# Patient Record
Sex: Female | Born: 1955 | Race: White | Hispanic: No | Smoking: Former smoker
Health system: Southern US, Community
[De-identification: ages and names within clinical notes are randomized; demographics above are authoritative.]

## PROBLEM LIST (undated history)

## (undated) DIAGNOSIS — E669 Obesity, unspecified: Secondary | ICD-10-CM

## (undated) DIAGNOSIS — R000 Tachycardia, unspecified: Secondary | ICD-10-CM

## (undated) DIAGNOSIS — K5792 Diverticulitis of intestine, part unspecified, without perforation or abscess without bleeding: Secondary | ICD-10-CM

## (undated) DIAGNOSIS — E785 Hyperlipidemia, unspecified: Secondary | ICD-10-CM

## (undated) DIAGNOSIS — N200 Calculus of kidney: Secondary | ICD-10-CM

## (undated) DIAGNOSIS — Z9889 Other specified postprocedural states: Secondary | ICD-10-CM

## (undated) DIAGNOSIS — I639 Cerebral infarction, unspecified: Secondary | ICD-10-CM

## (undated) DIAGNOSIS — K219 Gastro-esophageal reflux disease without esophagitis: Secondary | ICD-10-CM

## (undated) DIAGNOSIS — B370 Candidal stomatitis: Secondary | ICD-10-CM

## (undated) DIAGNOSIS — Z8719 Personal history of other diseases of the digestive system: Secondary | ICD-10-CM

## (undated) DIAGNOSIS — T8859XA Other complications of anesthesia, initial encounter: Secondary | ICD-10-CM

## (undated) DIAGNOSIS — M47812 Spondylosis without myelopathy or radiculopathy, cervical region: Secondary | ICD-10-CM

## (undated) DIAGNOSIS — R7303 Prediabetes: Secondary | ICD-10-CM

## (undated) DIAGNOSIS — T4145XA Adverse effect of unspecified anesthetic, initial encounter: Secondary | ICD-10-CM

## (undated) DIAGNOSIS — E039 Hypothyroidism, unspecified: Secondary | ICD-10-CM

## (undated) DIAGNOSIS — I10 Essential (primary) hypertension: Secondary | ICD-10-CM

## (undated) DIAGNOSIS — Z87891 Personal history of nicotine dependence: Secondary | ICD-10-CM

## (undated) DIAGNOSIS — R011 Cardiac murmur, unspecified: Secondary | ICD-10-CM

## (undated) DIAGNOSIS — Z8489 Family history of other specified conditions: Secondary | ICD-10-CM

## (undated) DIAGNOSIS — R112 Nausea with vomiting, unspecified: Secondary | ICD-10-CM

## (undated) DIAGNOSIS — Z9289 Personal history of other medical treatment: Secondary | ICD-10-CM

## (undated) DIAGNOSIS — E119 Type 2 diabetes mellitus without complications: Secondary | ICD-10-CM

## (undated) DIAGNOSIS — G459 Transient cerebral ischemic attack, unspecified: Secondary | ICD-10-CM

## (undated) HISTORY — DX: Personal history of nicotine dependence: Z87.891

## (undated) HISTORY — DX: Transient cerebral ischemic attack, unspecified: G45.9

## (undated) HISTORY — PX: CYSTOSCOPY W/ STONE MANIPULATION: SHX1427

## (undated) HISTORY — DX: Hyperlipidemia, unspecified: E78.5

## (undated) HISTORY — DX: Spondylosis without myelopathy or radiculopathy, cervical region: M47.812

## (undated) HISTORY — PX: CHOLECYSTECTOMY: SHX55

## (undated) HISTORY — PX: CYSTOSCOPY W/ URETERAL STENT PLACEMENT: SHX1429

## (undated) HISTORY — PX: APPENDECTOMY: SHX54

## (undated) HISTORY — DX: Obesity, unspecified: E66.9

## (undated) HISTORY — DX: Tachycardia, unspecified: R00.0

## (undated) HISTORY — DX: Prediabetes: R73.03

## (undated) HISTORY — DX: Cerebral infarction, unspecified: I63.9

## (undated) HISTORY — PX: ABDOMINAL HYSTERECTOMY: SHX81

## (undated) HISTORY — PX: TONSILLECTOMY: SUR1361

## (undated) HISTORY — DX: Essential (primary) hypertension: I10

## (undated) HISTORY — DX: Gastro-esophageal reflux disease without esophagitis: K21.9

## (undated) HISTORY — DX: Personal history of other diseases of the digestive system: Z87.19

## (undated) HISTORY — PX: BACK SURGERY: SHX140

---

## 1998-09-28 ENCOUNTER — Encounter: Payer: Self-pay | Admitting: Specialist

## 1998-09-28 ENCOUNTER — Ambulatory Visit: Admission: RE | Admit: 1998-09-28 | Discharge: 1998-09-28 | Payer: Self-pay | Admitting: Specialist

## 1998-10-13 ENCOUNTER — Ambulatory Visit (HOSPITAL_COMMUNITY): Admission: RE | Admit: 1998-10-13 | Discharge: 1998-10-13 | Payer: Self-pay | Admitting: Specialist

## 1998-10-13 ENCOUNTER — Encounter: Payer: Self-pay | Admitting: Specialist

## 1998-10-27 ENCOUNTER — Ambulatory Visit (HOSPITAL_COMMUNITY): Admission: RE | Admit: 1998-10-27 | Discharge: 1998-10-27 | Payer: Self-pay | Admitting: Specialist

## 1998-10-28 ENCOUNTER — Ambulatory Visit (HOSPITAL_COMMUNITY): Admission: RE | Admit: 1998-10-28 | Discharge: 1998-10-28 | Payer: Self-pay | Admitting: Specialist

## 1998-10-28 ENCOUNTER — Encounter: Payer: Self-pay | Admitting: Specialist

## 2000-05-30 ENCOUNTER — Encounter: Admission: RE | Admit: 2000-05-30 | Discharge: 2000-05-30 | Payer: Self-pay | Admitting: *Deleted

## 2000-10-13 ENCOUNTER — Encounter: Payer: Self-pay | Admitting: Specialist

## 2000-10-13 ENCOUNTER — Encounter: Admission: RE | Admit: 2000-10-13 | Discharge: 2000-10-13 | Payer: Self-pay | Admitting: Specialist

## 2000-11-09 ENCOUNTER — Ambulatory Visit (HOSPITAL_COMMUNITY): Admission: RE | Admit: 2000-11-09 | Discharge: 2000-11-09 | Payer: Self-pay | Admitting: Specialist

## 2000-11-09 ENCOUNTER — Encounter: Payer: Self-pay | Admitting: Specialist

## 2000-11-27 ENCOUNTER — Encounter: Payer: Self-pay | Admitting: Specialist

## 2000-11-27 ENCOUNTER — Ambulatory Visit (HOSPITAL_COMMUNITY): Admission: RE | Admit: 2000-11-27 | Discharge: 2000-11-27 | Payer: Self-pay | Admitting: Specialist

## 2001-01-09 ENCOUNTER — Encounter: Admission: RE | Admit: 2001-01-09 | Discharge: 2001-04-09 | Payer: Self-pay | Admitting: Anesthesiology

## 2001-01-11 ENCOUNTER — Encounter: Admission: RE | Admit: 2001-01-11 | Discharge: 2001-01-11 | Payer: Self-pay | Admitting: Obstetrics and Gynecology

## 2001-01-11 ENCOUNTER — Encounter: Payer: Self-pay | Admitting: Obstetrics and Gynecology

## 2001-02-06 ENCOUNTER — Other Ambulatory Visit: Admission: RE | Admit: 2001-02-06 | Discharge: 2001-02-06 | Payer: Self-pay | Admitting: Obstetrics and Gynecology

## 2001-02-19 ENCOUNTER — Encounter: Payer: Self-pay | Admitting: Obstetrics and Gynecology

## 2001-02-19 ENCOUNTER — Ambulatory Visit (HOSPITAL_COMMUNITY): Admission: RE | Admit: 2001-02-19 | Discharge: 2001-02-19 | Payer: Self-pay | Admitting: Obstetrics and Gynecology

## 2001-05-02 ENCOUNTER — Encounter: Admission: RE | Admit: 2001-05-02 | Discharge: 2001-07-07 | Payer: Self-pay | Admitting: Anesthesiology

## 2001-07-19 ENCOUNTER — Encounter: Payer: Self-pay | Admitting: Specialist

## 2001-07-19 ENCOUNTER — Encounter: Admission: RE | Admit: 2001-07-19 | Discharge: 2001-07-19 | Payer: Self-pay | Admitting: Specialist

## 2001-08-07 ENCOUNTER — Encounter: Admission: RE | Admit: 2001-08-07 | Discharge: 2001-08-07 | Payer: Self-pay | Admitting: Specialist

## 2001-08-13 ENCOUNTER — Encounter: Payer: Self-pay | Admitting: Specialist

## 2001-08-13 ENCOUNTER — Encounter: Admission: RE | Admit: 2001-08-13 | Discharge: 2001-08-13 | Payer: Self-pay | Admitting: Specialist

## 2002-10-14 ENCOUNTER — Encounter: Payer: Self-pay | Admitting: Specialist

## 2002-10-14 ENCOUNTER — Ambulatory Visit (HOSPITAL_COMMUNITY): Admission: RE | Admit: 2002-10-14 | Discharge: 2002-10-14 | Payer: Self-pay | Admitting: Specialist

## 2003-01-31 ENCOUNTER — Encounter: Payer: Self-pay | Admitting: Obstetrics and Gynecology

## 2003-01-31 ENCOUNTER — Ambulatory Visit: Admission: RE | Admit: 2003-01-31 | Discharge: 2003-01-31 | Payer: Self-pay | Admitting: Obstetrics and Gynecology

## 2003-03-03 ENCOUNTER — Encounter: Payer: Self-pay | Admitting: Obstetrics and Gynecology

## 2003-03-03 ENCOUNTER — Ambulatory Visit (HOSPITAL_COMMUNITY): Admission: RE | Admit: 2003-03-03 | Discharge: 2003-03-03 | Payer: Self-pay | Admitting: Obstetrics and Gynecology

## 2003-03-12 ENCOUNTER — Encounter: Payer: Self-pay | Admitting: Obstetrics and Gynecology

## 2003-03-19 ENCOUNTER — Inpatient Hospital Stay (HOSPITAL_COMMUNITY): Admission: RE | Admit: 2003-03-19 | Discharge: 2003-03-22 | Payer: Self-pay | Admitting: Obstetrics and Gynecology

## 2003-03-19 ENCOUNTER — Encounter (INDEPENDENT_AMBULATORY_CARE_PROVIDER_SITE_OTHER): Payer: Self-pay | Admitting: *Deleted

## 2003-08-27 ENCOUNTER — Encounter: Admission: RE | Admit: 2003-08-27 | Discharge: 2003-08-27 | Payer: Self-pay | Admitting: Specialist

## 2003-08-27 ENCOUNTER — Encounter: Payer: Self-pay | Admitting: Specialist

## 2003-12-18 ENCOUNTER — Encounter: Admission: RE | Admit: 2003-12-18 | Discharge: 2003-12-18 | Payer: Self-pay | Admitting: Specialist

## 2004-04-12 ENCOUNTER — Encounter: Admission: RE | Admit: 2004-04-12 | Discharge: 2004-04-12 | Payer: Self-pay | Admitting: Family Medicine

## 2004-04-12 ENCOUNTER — Ambulatory Visit (HOSPITAL_COMMUNITY): Admission: RE | Admit: 2004-04-12 | Discharge: 2004-04-12 | Payer: Self-pay | Admitting: Family Medicine

## 2004-04-12 ENCOUNTER — Encounter: Admission: RE | Admit: 2004-04-12 | Discharge: 2004-04-12 | Payer: Self-pay | Admitting: Sports Medicine

## 2004-04-20 ENCOUNTER — Encounter: Admission: RE | Admit: 2004-04-20 | Discharge: 2004-04-20 | Payer: Self-pay | Admitting: Family Medicine

## 2004-04-21 ENCOUNTER — Encounter: Admission: RE | Admit: 2004-04-21 | Discharge: 2004-04-21 | Payer: Self-pay | Admitting: Sports Medicine

## 2004-06-28 ENCOUNTER — Encounter: Admission: RE | Admit: 2004-06-28 | Discharge: 2004-06-28 | Payer: Self-pay | Admitting: Family Medicine

## 2004-07-19 ENCOUNTER — Encounter: Admission: RE | Admit: 2004-07-19 | Discharge: 2004-07-19 | Payer: Self-pay | Admitting: Sports Medicine

## 2004-07-19 ENCOUNTER — Ambulatory Visit: Payer: Self-pay | Admitting: Family Medicine

## 2004-08-04 ENCOUNTER — Ambulatory Visit: Payer: Self-pay | Admitting: Family Medicine

## 2004-08-04 ENCOUNTER — Encounter: Admission: RE | Admit: 2004-08-04 | Discharge: 2004-08-04 | Payer: Self-pay | Admitting: Sports Medicine

## 2004-08-11 ENCOUNTER — Ambulatory Visit: Payer: Self-pay | Admitting: Family Medicine

## 2004-10-29 ENCOUNTER — Ambulatory Visit: Payer: Self-pay | Admitting: Sports Medicine

## 2004-12-06 ENCOUNTER — Ambulatory Visit: Payer: Self-pay | Admitting: Family Medicine

## 2005-06-17 ENCOUNTER — Ambulatory Visit: Payer: Self-pay | Admitting: Physical Medicine & Rehabilitation

## 2005-06-17 ENCOUNTER — Inpatient Hospital Stay (HOSPITAL_COMMUNITY): Admission: RE | Admit: 2005-06-17 | Discharge: 2005-06-23 | Payer: Self-pay | Admitting: Specialist

## 2006-06-07 ENCOUNTER — Encounter (INDEPENDENT_AMBULATORY_CARE_PROVIDER_SITE_OTHER): Payer: Self-pay | Admitting: *Deleted

## 2006-11-24 ENCOUNTER — Ambulatory Visit: Payer: Self-pay | Admitting: Family Medicine

## 2006-11-24 LAB — CONVERTED CEMR LAB
AST: 20 units/L (ref 0–37)
Alkaline Phosphatase: 113 units/L (ref 39–117)
BUN: 11 mg/dL (ref 6–23)
Creatinine, Ser: 0.63 mg/dL (ref 0.40–1.20)
HDL: 48 mg/dL (ref >39)
LDL Cholesterol: 110 mg/dL — ABNORMAL HIGH (ref 0–99)
TSH: 2.549 microintl units/mL (ref 0.350–5.50)
Total CHOL/HDL Ratio: 3.9
VLDL: 27 mg/dL (ref 0–40)

## 2006-12-14 ENCOUNTER — Ambulatory Visit: Payer: Self-pay | Admitting: Family Medicine

## 2007-01-04 DIAGNOSIS — E039 Hypothyroidism, unspecified: Secondary | ICD-10-CM

## 2007-01-04 DIAGNOSIS — N10 Acute tubulo-interstitial nephritis: Secondary | ICD-10-CM

## 2007-01-04 DIAGNOSIS — R079 Chest pain, unspecified: Secondary | ICD-10-CM | POA: Insufficient documentation

## 2007-01-04 DIAGNOSIS — M545 Low back pain: Secondary | ICD-10-CM

## 2007-01-04 DIAGNOSIS — Z720 Tobacco use: Secondary | ICD-10-CM

## 2007-01-04 DIAGNOSIS — G5600 Carpal tunnel syndrome, unspecified upper limb: Secondary | ICD-10-CM

## 2007-01-04 DIAGNOSIS — M5382 Other specified dorsopathies, cervical region: Secondary | ICD-10-CM | POA: Insufficient documentation

## 2007-01-04 DIAGNOSIS — K219 Gastro-esophageal reflux disease without esophagitis: Secondary | ICD-10-CM

## 2007-01-05 ENCOUNTER — Encounter (INDEPENDENT_AMBULATORY_CARE_PROVIDER_SITE_OTHER): Payer: Self-pay | Admitting: *Deleted

## 2007-01-17 ENCOUNTER — Encounter: Admission: RE | Admit: 2007-01-17 | Discharge: 2007-01-17 | Payer: Self-pay | Admitting: Specialist

## 2007-01-26 ENCOUNTER — Ambulatory Visit: Payer: Self-pay | Admitting: Family Medicine

## 2007-01-31 ENCOUNTER — Encounter: Payer: Self-pay | Admitting: Family Medicine

## 2007-02-09 ENCOUNTER — Ambulatory Visit: Payer: Self-pay | Admitting: Family Medicine

## 2007-02-09 DIAGNOSIS — E119 Type 2 diabetes mellitus without complications: Secondary | ICD-10-CM | POA: Insufficient documentation

## 2007-03-26 ENCOUNTER — Telehealth (INDEPENDENT_AMBULATORY_CARE_PROVIDER_SITE_OTHER): Payer: Self-pay | Admitting: *Deleted

## 2007-03-30 ENCOUNTER — Ambulatory Visit: Payer: Self-pay | Admitting: Family Medicine

## 2007-03-30 ENCOUNTER — Ambulatory Visit: Payer: Self-pay | Admitting: Internal Medicine

## 2007-03-30 ENCOUNTER — Encounter: Payer: Self-pay | Admitting: Family Medicine

## 2007-03-30 ENCOUNTER — Inpatient Hospital Stay (HOSPITAL_COMMUNITY): Admission: AD | Admit: 2007-03-30 | Discharge: 2007-03-31 | Payer: Self-pay | Admitting: Family Medicine

## 2007-04-09 ENCOUNTER — Ambulatory Visit: Payer: Self-pay | Admitting: Family Medicine

## 2007-05-02 ENCOUNTER — Encounter: Payer: Self-pay | Admitting: Family Medicine

## 2007-05-17 ENCOUNTER — Encounter: Payer: Self-pay | Admitting: Family Medicine

## 2007-06-01 ENCOUNTER — Ambulatory Visit: Payer: Self-pay | Admitting: Family Medicine

## 2007-06-14 ENCOUNTER — Encounter: Payer: Self-pay | Admitting: Family Medicine

## 2007-08-13 ENCOUNTER — Ambulatory Visit: Payer: Self-pay | Admitting: Family Medicine

## 2007-08-13 ENCOUNTER — Emergency Department (HOSPITAL_COMMUNITY): Admission: EM | Admit: 2007-08-13 | Discharge: 2007-08-14 | Payer: Self-pay | Admitting: Emergency Medicine

## 2007-08-14 ENCOUNTER — Telehealth: Payer: Self-pay | Admitting: Family Medicine

## 2007-08-16 ENCOUNTER — Encounter: Admission: RE | Admit: 2007-08-16 | Discharge: 2007-08-16 | Payer: Self-pay | Admitting: Family Medicine

## 2007-08-17 ENCOUNTER — Ambulatory Visit: Payer: Self-pay | Admitting: Family Medicine

## 2007-08-17 LAB — CONVERTED CEMR LAB
Nitrite: NEGATIVE
Protein, U semiquant: NEGATIVE
Urobilinogen, UA: 0.2

## 2007-09-03 ENCOUNTER — Ambulatory Visit (HOSPITAL_COMMUNITY): Admission: RE | Admit: 2007-09-03 | Discharge: 2007-09-03 | Payer: Self-pay | Admitting: Family Medicine

## 2007-09-17 ENCOUNTER — Ambulatory Visit: Payer: Self-pay | Admitting: Family Medicine

## 2007-09-17 DIAGNOSIS — R002 Palpitations: Secondary | ICD-10-CM | POA: Insufficient documentation

## 2007-09-17 DIAGNOSIS — I10 Essential (primary) hypertension: Secondary | ICD-10-CM | POA: Insufficient documentation

## 2007-09-24 ENCOUNTER — Encounter: Payer: Self-pay | Admitting: Family Medicine

## 2007-09-25 ENCOUNTER — Telehealth: Payer: Self-pay | Admitting: *Deleted

## 2007-11-02 ENCOUNTER — Ambulatory Visit: Payer: Self-pay | Admitting: Family Medicine

## 2007-11-28 ENCOUNTER — Encounter: Payer: Self-pay | Admitting: Family Medicine

## 2007-12-24 ENCOUNTER — Telehealth: Payer: Self-pay | Admitting: *Deleted

## 2007-12-24 ENCOUNTER — Ambulatory Visit: Payer: Self-pay | Admitting: Sports Medicine

## 2008-01-04 ENCOUNTER — Encounter: Payer: Self-pay | Admitting: Family Medicine

## 2008-01-04 ENCOUNTER — Emergency Department (HOSPITAL_COMMUNITY): Admission: EM | Admit: 2008-01-04 | Discharge: 2008-01-04 | Payer: Self-pay | Admitting: Family Medicine

## 2008-01-04 ENCOUNTER — Telehealth: Payer: Self-pay | Admitting: *Deleted

## 2008-01-04 LAB — CONVERTED CEMR LAB
Cholesterol, target level: 200 mg/dL
LDL Goal: 70 mg/dL

## 2008-01-07 ENCOUNTER — Encounter: Payer: Self-pay | Admitting: Family Medicine

## 2008-01-07 ENCOUNTER — Ambulatory Visit: Payer: Self-pay | Admitting: Vascular Surgery

## 2008-01-07 ENCOUNTER — Ambulatory Visit: Admission: RE | Admit: 2008-01-07 | Discharge: 2008-01-07 | Payer: Self-pay | Admitting: Family Medicine

## 2008-01-08 ENCOUNTER — Telehealth: Payer: Self-pay | Admitting: *Deleted

## 2008-01-09 ENCOUNTER — Ambulatory Visit: Payer: Self-pay | Admitting: Family Medicine

## 2008-01-10 ENCOUNTER — Telehealth: Payer: Self-pay | Admitting: *Deleted

## 2008-01-11 ENCOUNTER — Ambulatory Visit: Payer: Self-pay | Admitting: Family Medicine

## 2008-01-14 ENCOUNTER — Ambulatory Visit: Payer: Self-pay | Admitting: Family Medicine

## 2008-01-14 ENCOUNTER — Telehealth: Payer: Self-pay | Admitting: *Deleted

## 2008-01-14 LAB — CONVERTED CEMR LAB
Bilirubin Urine: NEGATIVE
Protein, U semiquant: 30
Urobilinogen, UA: 0.2

## 2008-01-15 ENCOUNTER — Encounter: Payer: Self-pay | Admitting: Family Medicine

## 2008-01-17 ENCOUNTER — Encounter: Admission: RE | Admit: 2008-01-17 | Discharge: 2008-01-17 | Payer: Self-pay | Admitting: Specialist

## 2008-01-21 ENCOUNTER — Encounter: Payer: Self-pay | Admitting: Family Medicine

## 2008-01-22 ENCOUNTER — Encounter: Payer: Self-pay | Admitting: Family Medicine

## 2008-01-22 ENCOUNTER — Ambulatory Visit (HOSPITAL_COMMUNITY): Admission: RE | Admit: 2008-01-22 | Discharge: 2008-01-22 | Payer: Self-pay | Admitting: Family Medicine

## 2008-02-29 ENCOUNTER — Ambulatory Visit: Payer: Self-pay | Admitting: Family Medicine

## 2008-03-06 ENCOUNTER — Encounter: Payer: Self-pay | Admitting: Family Medicine

## 2008-03-27 ENCOUNTER — Telehealth (INDEPENDENT_AMBULATORY_CARE_PROVIDER_SITE_OTHER): Payer: Self-pay | Admitting: *Deleted

## 2008-04-07 ENCOUNTER — Ambulatory Visit: Payer: Self-pay | Admitting: Family Medicine

## 2008-05-02 ENCOUNTER — Encounter: Payer: Self-pay | Admitting: Family Medicine

## 2008-05-02 DIAGNOSIS — D126 Benign neoplasm of colon, unspecified: Secondary | ICD-10-CM

## 2008-05-05 ENCOUNTER — Ambulatory Visit: Payer: Self-pay | Admitting: Cardiovascular Disease

## 2008-05-05 LAB — CONVERTED CEMR LAB
BUN: 12 mg/dL (ref 6–23)
Basophils Relative: 0.5 % (ref 0.0–1.0)
CO2: 29 meq/L (ref 19–32)
Calcium: 11.1 mg/dL — ABNORMAL HIGH (ref 8.4–10.5)
Creatinine, Ser: 0.6 mg/dL (ref 0.4–1.2)
Eosinophils Relative: 2.8 % (ref 0.0–5.0)
Glucose, Bld: 117 mg/dL — ABNORMAL HIGH (ref 70–99)
Hemoglobin: 14.5 g/dL (ref 12.0–15.0)
Lymphocytes Relative: 34 % (ref 12.0–46.0)
MCHC: 34.8 g/dL (ref 30.0–36.0)
Monocytes Relative: 2.5 % — ABNORMAL LOW (ref 3.0–12.0)
Neutro Abs: 5.5 10*3/uL (ref 1.4–7.7)
Prothrombin Time: 12.7 s (ref 10.9–13.3)
RBC: 4.56 M/uL (ref 3.87–5.11)

## 2008-05-08 ENCOUNTER — Ambulatory Visit: Payer: Self-pay | Admitting: Cardiovascular Disease

## 2008-05-15 ENCOUNTER — Inpatient Hospital Stay (HOSPITAL_BASED_OUTPATIENT_CLINIC_OR_DEPARTMENT_OTHER): Admission: RE | Admit: 2008-05-15 | Discharge: 2008-05-15 | Payer: Self-pay | Admitting: Cardiovascular Disease

## 2008-05-15 ENCOUNTER — Ambulatory Visit: Payer: Self-pay | Admitting: Cardiovascular Disease

## 2008-06-19 ENCOUNTER — Ambulatory Visit: Payer: Self-pay | Admitting: Cardiovascular Disease

## 2008-07-22 ENCOUNTER — Ambulatory Visit: Payer: Self-pay | Admitting: Family Medicine

## 2008-07-22 ENCOUNTER — Encounter: Payer: Self-pay | Admitting: Family Medicine

## 2008-07-22 ENCOUNTER — Telehealth: Payer: Self-pay | Admitting: *Deleted

## 2008-07-22 LAB — CONVERTED CEMR LAB
Cortisol, Plasma: 6.4 ug/dL
Nitrite: NEGATIVE
Protein, U semiquant: NEGATIVE

## 2008-08-11 ENCOUNTER — Ambulatory Visit: Payer: Self-pay | Admitting: Family Medicine

## 2008-08-11 LAB — CONVERTED CEMR LAB
CO2: 24 meq/L (ref 19–32)
Calcium: 11.5 mg/dL — ABNORMAL HIGH (ref 8.4–10.5)
Chloride: 100 meq/L (ref 96–112)
Creatinine, Ser: 0.6 mg/dL (ref 0.40–1.20)
Glucose, Bld: 85 mg/dL (ref 70–99)
Sodium: 137 meq/L (ref 135–145)

## 2008-08-18 ENCOUNTER — Encounter: Admission: RE | Admit: 2008-08-18 | Discharge: 2008-08-18 | Payer: Self-pay | Admitting: Family Medicine

## 2008-09-09 ENCOUNTER — Ambulatory Visit: Payer: Self-pay | Admitting: Family Medicine

## 2008-09-09 ENCOUNTER — Encounter (INDEPENDENT_AMBULATORY_CARE_PROVIDER_SITE_OTHER): Payer: Self-pay | Admitting: Family Medicine

## 2008-09-09 ENCOUNTER — Encounter: Payer: Self-pay | Admitting: Family Medicine

## 2008-09-09 LAB — CONVERTED CEMR LAB
Ketones, urine, test strip: NEGATIVE
Nitrite: NEGATIVE
Protein, U semiquant: NEGATIVE
Urobilinogen, UA: 0.2

## 2008-09-10 ENCOUNTER — Encounter: Payer: Self-pay | Admitting: Family Medicine

## 2008-09-16 ENCOUNTER — Telehealth: Payer: Self-pay | Admitting: *Deleted

## 2008-09-17 LAB — CONVERTED CEMR LAB: PTH: 49.6 pg/mL (ref 14.0–72.0)

## 2008-09-18 ENCOUNTER — Encounter: Payer: Self-pay | Admitting: Family Medicine

## 2008-10-17 ENCOUNTER — Ambulatory Visit: Payer: Self-pay | Admitting: Family Medicine

## 2008-10-17 ENCOUNTER — Encounter: Admission: RE | Admit: 2008-10-17 | Discharge: 2008-10-17 | Payer: Self-pay | Admitting: Family Medicine

## 2008-10-22 ENCOUNTER — Ambulatory Visit: Payer: Self-pay | Admitting: Family Medicine

## 2008-10-22 ENCOUNTER — Telehealth: Payer: Self-pay | Admitting: *Deleted

## 2008-10-22 DIAGNOSIS — M5430 Sciatica, unspecified side: Secondary | ICD-10-CM

## 2008-10-24 ENCOUNTER — Encounter: Admission: RE | Admit: 2008-10-24 | Discharge: 2008-10-24 | Payer: Self-pay | Admitting: Specialist

## 2008-11-06 ENCOUNTER — Encounter: Admission: RE | Admit: 2008-11-06 | Discharge: 2008-11-06 | Payer: Self-pay | Admitting: Specialist

## 2008-12-10 ENCOUNTER — Ambulatory Visit: Payer: Self-pay | Admitting: Family Medicine

## 2008-12-10 DIAGNOSIS — E7800 Pure hypercholesterolemia, unspecified: Secondary | ICD-10-CM

## 2008-12-10 DIAGNOSIS — M719 Bursopathy, unspecified: Secondary | ICD-10-CM

## 2008-12-10 DIAGNOSIS — M67919 Unspecified disorder of synovium and tendon, unspecified shoulder: Secondary | ICD-10-CM | POA: Insufficient documentation

## 2008-12-11 ENCOUNTER — Ambulatory Visit: Payer: Self-pay | Admitting: Cardiovascular Disease

## 2008-12-11 LAB — CONVERTED CEMR LAB
ALT: 34 units/L (ref 0–35)
AST: 25 units/L (ref 0–37)
Albumin: 4.1 g/dL (ref 3.5–5.2)
TSH: 2.31 microintl units/mL (ref 0.35–5.50)
Total Bilirubin: 0.8 mg/dL (ref 0.3–1.2)
Total Protein: 7.5 g/dL (ref 6.0–8.3)

## 2008-12-12 LAB — CONVERTED CEMR LAB
ALT: 30 units/L (ref 0–35)
AST: 21 units/L (ref 0–37)
Alkaline Phosphatase: 95 units/L (ref 39–117)
CO2: 24 meq/L (ref 19–32)
Creatinine, Ser: 0.6 mg/dL (ref 0.40–1.20)
LDL Cholesterol: 106 mg/dL — ABNORMAL HIGH (ref 0–99)
Sodium: 138 meq/L (ref 135–145)
Total Bilirubin: 0.4 mg/dL (ref 0.3–1.2)
Total CHOL/HDL Ratio: 3.6
Total Protein: 7.8 g/dL (ref 6.0–8.3)
VLDL: 41 mg/dL — ABNORMAL HIGH (ref 0–40)

## 2008-12-15 ENCOUNTER — Ambulatory Visit: Payer: Self-pay | Admitting: Family Medicine

## 2008-12-15 ENCOUNTER — Encounter: Payer: Self-pay | Admitting: Family Medicine

## 2008-12-15 ENCOUNTER — Telehealth: Payer: Self-pay | Admitting: Family Medicine

## 2008-12-15 LAB — CONVERTED CEMR LAB
AST: 16 units/L (ref 0–37)
Albumin: 4.8 g/dL (ref 3.5–5.2)
Alkaline Phosphatase: 88 units/L (ref 39–117)
Glucose, Bld: 86 mg/dL (ref 70–99)
Potassium: 4.5 meq/L (ref 3.5–5.3)
Sodium: 139 meq/L (ref 135–145)
Total Bilirubin: 0.4 mg/dL (ref 0.3–1.2)
Total Protein: 7.9 g/dL (ref 6.0–8.3)

## 2008-12-16 ENCOUNTER — Encounter: Admission: RE | Admit: 2008-12-16 | Discharge: 2008-12-16 | Payer: Self-pay | Admitting: Family Medicine

## 2008-12-17 ENCOUNTER — Ambulatory Visit (HOSPITAL_COMMUNITY): Admission: RE | Admit: 2008-12-17 | Discharge: 2008-12-17 | Payer: Self-pay | Admitting: Family Medicine

## 2009-02-04 ENCOUNTER — Ambulatory Visit: Payer: Self-pay | Admitting: Family Medicine

## 2009-02-04 LAB — CONVERTED CEMR LAB
Bilirubin Urine: NEGATIVE
Nitrite: NEGATIVE
Protein, U semiquant: 100
Urobilinogen, UA: 0.2

## 2009-04-21 ENCOUNTER — Ambulatory Visit: Payer: Self-pay | Admitting: Family Medicine

## 2009-04-21 DIAGNOSIS — R319 Hematuria, unspecified: Secondary | ICD-10-CM

## 2009-04-21 LAB — CONVERTED CEMR LAB
Glucose, Urine, Semiquant: NEGATIVE
Nitrite: NEGATIVE
Specific Gravity, Urine: 1.02
pH: 6

## 2009-06-24 ENCOUNTER — Encounter (INDEPENDENT_AMBULATORY_CARE_PROVIDER_SITE_OTHER): Payer: Self-pay | Admitting: *Deleted

## 2009-08-14 ENCOUNTER — Ambulatory Visit: Payer: Self-pay | Admitting: Family Medicine

## 2009-08-14 LAB — CONVERTED CEMR LAB
Eosinophils Absolute: 0.3 10*3/uL (ref 0.0–0.7)
Eosinophils Relative: 3 % (ref 0–5)
HCT: 42.5 % (ref 36.0–46.0)
Hemoglobin: 13.7 g/dL (ref 12.0–15.0)
Lymphs Abs: 4.4 10*3/uL — ABNORMAL HIGH (ref 0.7–4.0)
MCHC: 32.2 g/dL (ref 30.0–36.0)
MCV: 93.4 fL (ref 78.0–100.0)
Monocytes Absolute: 0.8 10*3/uL (ref 0.1–1.0)
Monocytes Relative: 8 % (ref 3–12)
RBC: 4.55 M/uL (ref 3.87–5.11)
WBC: 10.1 10*3/uL (ref 4.0–10.5)

## 2009-08-21 ENCOUNTER — Encounter: Admission: RE | Admit: 2009-08-21 | Discharge: 2009-08-21 | Payer: Self-pay | Admitting: Family Medicine

## 2009-09-18 ENCOUNTER — Ambulatory Visit: Payer: Self-pay | Admitting: Family Medicine

## 2009-09-19 ENCOUNTER — Emergency Department (HOSPITAL_COMMUNITY): Admission: EM | Admit: 2009-09-19 | Discharge: 2009-09-19 | Payer: Self-pay | Admitting: Emergency Medicine

## 2009-09-21 ENCOUNTER — Encounter: Payer: Self-pay | Admitting: Sports Medicine

## 2009-09-21 ENCOUNTER — Ambulatory Visit: Payer: Self-pay | Admitting: Family Medicine

## 2009-09-21 ENCOUNTER — Telehealth: Payer: Self-pay | Admitting: Family Medicine

## 2009-09-21 DIAGNOSIS — K862 Cyst of pancreas: Secondary | ICD-10-CM

## 2009-09-21 DIAGNOSIS — K863 Pseudocyst of pancreas: Secondary | ICD-10-CM

## 2009-09-21 LAB — CONVERTED CEMR LAB
Bilirubin Urine: NEGATIVE
Glucose, Urine, Semiquant: NEGATIVE
Ketones, urine, test strip: NEGATIVE
Nitrite: NEGATIVE
Protein, U semiquant: NEGATIVE
Specific Gravity, Urine: 1.015
Urobilinogen, UA: 0.2
pH: 6.5

## 2009-09-22 ENCOUNTER — Ambulatory Visit (HOSPITAL_COMMUNITY): Admission: RE | Admit: 2009-09-22 | Discharge: 2009-09-22 | Payer: Self-pay | Admitting: Sports Medicine

## 2009-09-22 ENCOUNTER — Telehealth: Payer: Self-pay | Admitting: Sports Medicine

## 2009-09-23 ENCOUNTER — Telehealth: Payer: Self-pay | Admitting: Family Medicine

## 2009-09-25 ENCOUNTER — Ambulatory Visit: Payer: Self-pay | Admitting: Family Medicine

## 2009-09-25 ENCOUNTER — Ambulatory Visit (HOSPITAL_COMMUNITY): Admission: RE | Admit: 2009-09-25 | Discharge: 2009-09-25 | Payer: Self-pay | Admitting: Family Medicine

## 2009-09-25 LAB — CONVERTED CEMR LAB
ALT: 27 units/L (ref 0–35)
AST: 21 units/L (ref 0–37)
Albumin: 4.6 g/dL (ref 3.5–5.2)
Calcium: 11.2 mg/dL — ABNORMAL HIGH (ref 8.4–10.5)
Creatinine, Ser: 0.72 mg/dL (ref 0.40–1.20)
Sodium: 140 meq/L (ref 135–145)
TSH: 3.533 microintl units/mL (ref 0.350–4.500)

## 2009-10-05 ENCOUNTER — Encounter: Payer: Self-pay | Admitting: Family Medicine

## 2009-10-07 ENCOUNTER — Encounter: Payer: Self-pay | Admitting: Family Medicine

## 2009-10-08 DIAGNOSIS — E669 Obesity, unspecified: Secondary | ICD-10-CM | POA: Insufficient documentation

## 2009-10-16 ENCOUNTER — Encounter (INDEPENDENT_AMBULATORY_CARE_PROVIDER_SITE_OTHER): Payer: Self-pay | Admitting: *Deleted

## 2009-10-16 LAB — CONVERTED CEMR LAB
ALT: 31 units/L
AST: 27 units/L
Alkaline Phosphatase: 92 units/L
BUN: 12 mg/dL
Chloride: 103 meq/L
Creatinine, Ser: 0.61 mg/dL
Total Protein: 7 g/dL

## 2009-12-11 ENCOUNTER — Encounter (INDEPENDENT_AMBULATORY_CARE_PROVIDER_SITE_OTHER): Payer: Self-pay | Admitting: *Deleted

## 2009-12-23 ENCOUNTER — Ambulatory Visit: Payer: Self-pay | Admitting: Cardiovascular Disease

## 2010-01-13 ENCOUNTER — Telehealth (INDEPENDENT_AMBULATORY_CARE_PROVIDER_SITE_OTHER): Payer: Self-pay | Admitting: *Deleted

## 2010-01-27 ENCOUNTER — Ambulatory Visit: Payer: Self-pay | Admitting: Family Medicine

## 2010-01-27 DIAGNOSIS — R21 Rash and other nonspecific skin eruption: Secondary | ICD-10-CM

## 2010-01-27 LAB — CONVERTED CEMR LAB
Bilirubin Urine: NEGATIVE
Glucose, Urine, Semiquant: NEGATIVE
Ketones, urine, test strip: NEGATIVE
Nitrite: NEGATIVE
Specific Gravity, Urine: >=1.03
Urobilinogen, UA: 0.2
pH: 5.5

## 2010-02-08 IMAGING — NM NM HEPATO W/GB/PHARM/[PERSON_NAME]
2 series · 12 of 12 positions shown · non-contrast
Comparison: Ultrasound 09/19/2009

CLINICAL DATA: Abdominal pain

NUCLEAR MEDICINE HEPATOBILIARY IMAGING WITH GALLBLADDER EF
TECHNIQUE: Sequential images of the abdomen were obtained [DATE]
minutes following intravenous administration of
radiopharmaceutical.  After slow intravenous infusion of 1.6 ucg
Cholecystokinin, gallbladder ejection fraction was determined.
Radiopharmaceutical:  5.1 mCi 6c-AAm Choletec

[he hepatobiliary · 3.43mm/px · 6 of 51 frames shown (1 of 2)]
[frame 5/51]
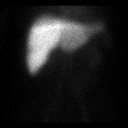
[frame 13/51]
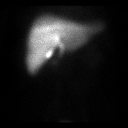
[frame 22/51]
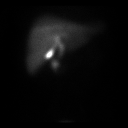
[frame 30/51]
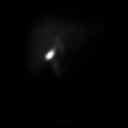
[frame 39/51]
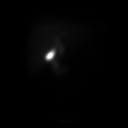
[frame 47/51]
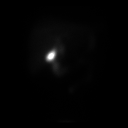

[he hepatobiliary · 3.43mm/px · 6 of 30 frames shown (2 of 2)]
[frame 3/30]
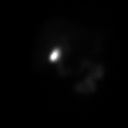
[frame 8/30]
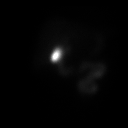
[frame 13/30]
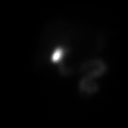
[frame 18/30]
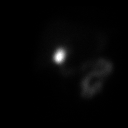
[frame 23/30]
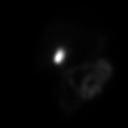
[frame 28/30]
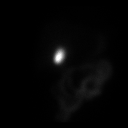

[12 of 12 positions shown; findings below may reference images not displayed]

FINDINGS: There is prompt extraction of radiotracer from the blood
pool and homogeneous uptake within the liver.  The gallbladder is
evident by 20 minutes.  Counts are present within the bowel by 50
minutes.  Upon administration of a cholecystokinin, the gallbladder
contracts minimally with a calculated ejection fraction = 7%  at 30
min (Normal greater than 30 % ejection)
IMPRESSION: 1. Low gallbladder  ejection fraction =  7  %. Common differential
diagnosis includes biliary dyskinesia,  chronic cholecystitis, and
sphincter Oddi dysfunction.

2. Patent cystic duct and common bile duct.

## 2010-03-09 ENCOUNTER — Emergency Department (HOSPITAL_COMMUNITY): Admission: EM | Admit: 2010-03-09 | Discharge: 2010-03-10 | Payer: Self-pay | Admitting: Emergency Medicine

## 2010-03-10 ENCOUNTER — Encounter: Payer: Self-pay | Admitting: Family Medicine

## 2010-03-12 ENCOUNTER — Ambulatory Visit: Payer: Self-pay | Admitting: Family Medicine

## 2010-03-12 DIAGNOSIS — F329 Major depressive disorder, single episode, unspecified: Secondary | ICD-10-CM

## 2010-03-12 DIAGNOSIS — N390 Urinary tract infection, site not specified: Secondary | ICD-10-CM | POA: Insufficient documentation

## 2010-03-12 LAB — CONVERTED CEMR LAB
Bilirubin Urine: NEGATIVE
Glucose, Urine, Semiquant: NEGATIVE
Ketones, urine, test strip: NEGATIVE
Specific Gravity, Urine: 1.015
Urobilinogen, UA: 0.2
WBC, UA: >20 cells/hpf

## 2010-03-26 ENCOUNTER — Encounter: Payer: Self-pay | Admitting: Family Medicine

## 2010-07-23 ENCOUNTER — Encounter: Payer: Self-pay | Admitting: Family Medicine

## 2010-11-28 ENCOUNTER — Encounter: Payer: Self-pay | Admitting: Family Medicine

## 2010-12-07 NOTE — Miscellaneous (Signed)
Summary: refil request   Clinical Lists Changes Marcille Buffy from [facility] called [phone]. they will be her new pharmacy. Their fax is [phone].  wanted refill on ketoconazole. denied. I will call pt to come back in if lesions are still bothering her.Golden Circle RN  Mar 26, 2010 11:25 AM I explained that pcp is not here. offered appt. she has one coming up. states the creme is helping the lesion. told her I will forward this to pcp & if he refills it I will call her.Golden Circle RN  Mar 26, 2010 11:27 AM  Since helping, I will refill. Doralee Albino MD  Mar 26, 2010 11:31 AM   Medications: Rx of KETOCONAZOLE 2 % CREA (KETOCONAZOLE) apply to rash two times a day Disp 30 gram tube for ringworm;  #30 x 2;  Signed;  Entered by: Doralee Albino MD;  Authorized by: Doralee Albino MD;  Method used: Printed then faxed to [facility] [id number] [address] [place], [place], [place]  [address], Ph: [phone], Fax: [phone]    Prescriptions: KETOCONAZOLE 2 % CREA (KETOCONAZOLE) apply to rash two times a day Disp 30 gram tube for ringworm  #30 x 2   Entered and Authorized by:   Doralee Albino MD   Signed by:   Doralee Albino MD on 03/26/2010   Method used:   Printed then faxed to ...       [facility]  [address] [id number] (retail)       [address]       [place], [place]  [address]       Ph: [phone]       Fax: [phone]   RxID:   [id number]  called to her new pharmacy & informed pt.Golden Circle RN  Mar 26, 2010 3:14 PM

## 2010-12-07 NOTE — Progress Notes (Signed)
 Summary: Test Res   Phone Note Call from Patient Call back at Home Phone [phone]   Caller: Patient Summary of Call: Pt was told by Dr. Curtis to come back over after test today, but we were at lunch and she went ahead and went home can we please call her with the results of her test.   Initial call taken by: Madelin Daring,  September 22, 2009 2:26 PM  Follow-up for Phone Call        advised patient that RN will send message to MD. advised he is not in clinic today but will call back when he has responded. Follow-up by: Avelina Sharps RN,  September 22, 2009 3:10 PM  Additional Follow-up for Phone Call Additional follow up Details #1::        Results d/w patient.  Will make referral to [facility], will also have her come see me for pre-op clearance.  Please set her up to see me on Monday, ok to double book appts. See [facility] referral in chart and please send previous office note from 09/21/09 as well as HIDA scan and UCx resuts to [facility]. Additional Follow-up by: Debby Curtis MD,  September 23, 2009 9:09 AM

## 2010-12-07 NOTE — Assessment & Plan Note (Signed)
Summary: pt past due for f/u per pt request/tg      Allergies Added:   Visit Type:  Follow-up Primary Provider:  Doralee Albino MD   History of Present Illness: Danielle [name] is seen today in F/U for SSCP.  She has had it for two weeks.  It is atypical and associated wit headache, numbness in the left arm and generalized leg weakness.  She had a totally normal heart cath in July of 2009.  Her ECG is normal today and unchanged form 09/2009. I reassured her and dont think this is related to her heart.  She had her gallblader removed two months ago with resolution of most of her nausea.  I suspect her symptoms are musculoskeletal    Cardiovascular Risk History:      Positive major cardiovascular risk factors include hyperlipidemia, hypertension, and family history for ischemic heart disease (males less than 74 years old).  Negative major cardiovascular risk factors include female age less than 2 years old, no history of diabetes, and non-tobacco-user status.        Positive history for target organ damage include prior stroke (or TIA).  Further assessment for target organ damage reveals no history of ASHD, cardiac end-organ damage (CHF/LVH), peripheral vascular disease, renal insufficiency, or hypertensive retinopathy.    Current Problems (verified): 1)  ? 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) 9)  Abdominal Pain, Right Upper Quadrant  (ICD-789.01) 10)  Enlargement of Lymph Nodes  (ICD-785.6) 11)  Hematuria Unspecified  (ICD-599.70) 12)  U R I  (ICD-465.9) 13)  Dysuria  (ICD-788.1) 14)  Vomiting, Acute  (ICD-787.03) 15)  Vitamin D Deficiency  (ICD-268.9) 16)  Rotator Cuff Syndrome  (ICD-726.10) 17)  Hypercholesterolemia  (ICD-272.0) 18)  Sciatica, Left  (ICD-724.3) 19)  Back  Pain  (ICD-724.5) 20)  Colonic Polyps, Hyperplastic  (ICD-211.3) 21)  Special Screening For Malignant Neoplasms Colon  (ICD-V76.51) 22)  Hematuria Unspecified  (ICD-599.70) 23)  Symptom, Abnormal Weight Gain  (ICD-783.1) 24)  Abnormal Glucose Nec  (ICD-790.29) 25)  Tobacco Dependence  (ICD-305.1) 26)  Pyelonephritis, Acute  (ICD-590.10) 27)  Hypothyroidism, Unspecified  (ICD-244.9) 28)  Hyperthyroidism, Nos  (ICD-242.90) 29)  Hypercalcemia  (ICD-275.42) 30)  Gastroesophageal Reflux, No Esophagitis  (ICD-530.81) 31)  Cervical Spine Disorder, Nos  (ICD-723.9) 32)  Carpal Tunnel Syndrome  (ICD-354.0) 33)  Back Pain W/radiation, Unspecified  (ICD-724.4)  Current Medications (verified): 1)  Aspirin Childrens 81 Mg Chew (Aspirin) .... Take 1 Tablet By Mouth Once A Day 2)  Levoxyl 175 Mcg Tabs (Levothyroxine Sodium) .... Take 1 Tablet By Mouth Once A Day 3)  Nitroglycerin 0.4 Mg Subl (Nitroglycerin) .... Place 1 Tablet Under Tongue As Directed 4)  Eq Omeprazole 20 Mg  Tbec (Omeprazole) .... One By Mouth Two Times A Day 5)  Hydrochlorothiazide 12.5 Mg  Tabs (Hydrochlorothiazide) .... Take 1 Tab  By Mouth Every Morning 6)  Simvastatin 80 Mg Tabs (Simvastatin) .Marland Kitchen.. 1 By Mouth At Bedtime 7)  Ranitidine Hcl 150 Mg Caps (Ranitidine Hcl) .... One Two Times A Day  Allergies (verified): 1)  ! Codeine 2)  ! 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 Couldn't see bottom part of heart." Diaphargmatic attenuation??? Normal cath 05/2006   Relative tachycardia Meds: ASA 81 mg daily Ibuprofen 200 mg QID as needed pain - only takes every now and then Levoxyl 175 mg daily Nortriptyline 25 mg three times a day - was taking for foot/ankle pain but was not helping so just stopped 1 week ago Omeprazole magnesium EC delayed release 20 mg daily  Past Surgical History: Last updated: 03/30/2007 11-24-06 TC 185, TG 136, HDL 48, LDL 110 - 11/27/2006,  back surg x 10  - 12/06/2004, cardiolyte stress test nl EF: nl - 08/07/2006,  T score: -0.58 - 12/14/2006  Family History: Last updated: 03/30/2007 Father and brother MIs - also had HTN, DM, CHF Ca, CAD, CVA, DM Strongly pos for DM  Social History: Last updated: 03/30/2007 Lives at home by herself. husband died Mar 20, 2005 of MI. Has 2 daughters and 3 grandchildren. Is a retired Librarian, academic with elderly and handicapped. Exercises by walking 1 mile per day. No tobacco now but is a past smoker 45 pack year history. No drugs.   Review of Systems       Denies fever, malais, weight loss, blurry vision, decreased visual acuity, cough, sputum, SOB, hemoptysis, pleuritic pain, palpitaitons, heartburn, abdominal pain, melena, lower extremity edema, claudication, or rash.   Vital Signs:  Patient profile:   55 year old female Weight:      181 pounds Pulse rate:   94 / minute BP sitting:   137 / 88  (right arm)  Vitals Entered By: Danielle Saa, CNA (December 23, 2009 1:06 PM)  Physical Exam  General:  Affect appropriate Healthy:  appears stated age HEENT: normal Neck supple with no adenopathy JVP normal no bruits no thyromegaly Lungs clear with no wheezing and good diaphragmatic motion Heart:  S1/S2 no murmur,rub, gallop or click PMI normal Abdomen: benighn, BS positve, no tenderness, no AAA no bruit.  No HSM or HJR Distal pulses intact with no bruits No edema Neuro non-focal Skin warm and dry    Impression & Recommendations:  Problem # 1:  CHEST PAIN  (ICD-786.50) Non-cardiac normal cath in July 2009.  Observe Her updated medication list for this problem includes:    Aspirin Childrens 81 Mg Chew (Aspirin) .Marland Kitchen... Take 1 tablet by mouth once a day    Nitroglycerin 0.4 Mg Subl (Nitroglycerin) .Marland Kitchen... Place 1 tablet under tongue as directed  Problem # 2:  ESSENTIAL HYPERTENSION, BENIGN (ICD-401.1) Well controlled continue low dose diuretic Her updated medication list for this problem includes:    Aspirin Childrens 81 Mg Chew (Aspirin) .Marland Kitchen... Take 1 tablet by mouth once a day    Hydrochlorothiazide 12.5 Mg Tabs (Hydrochlorothiazide) .Marland Kitchen... Take 1 tab  by mouth every morning  Problem # 3:  HIATAL HERNIA (ICD-553.3) Symptoms possibly related to this.  Consider EGD Her updated medication list for this problem includes:    Eq Omeprazole 20 Mg Tbec (Omeprazole) ..... One by mouth two times a day    Ranitidine Hcl 150 Mg Caps (Ranitidine hcl) ..... One two times a day  Problem # 4:  EDEMA (ICD-782.3) Dependant  Improved continue low  dose diuretic  Cardiovascular Risk Assessment/Plan:      The patient's hypertensive risk group is category C: Target organ damage and/or diabetes.  Her calculated 10 year risk of coronary heart disease is 7 %.  Today's blood pressure is 137/88.  Her blood pressure goal is < 140/90.  Patient Instructions: 1)  Your physician recommends that you schedule a follow-up appointment as needed  2)  Your physician recommends that you continue on your current medications as directed. Please refer to the Current Medication list given to you today.     EKG Report  Procedure date:  12/23/2009  Findings:      NSR 83 RAD Normal ECG

## 2010-12-07 NOTE — Assessment & Plan Note (Signed)
Summary: f/up,tcb   Vital Signs:  Patient profile:   54 year old female Height:      62.25 inches Weight:      182.2 pounds BMI:     33.18 Temp:     98.2 degrees F oral Pulse rate:   81 / minute BP sitting:   141 / 85  (left arm) Cuff size:   large  Vitals Entered By: Gladstone Pih (Mar 12, 2010 1:40 PM) CC: F/U ED visit ,C/O  burning and painful urination, ?  ringworm,  Is Patient Diabetic? No Pain Assessment Patient in pain? no        Primary Care Provider:  Doralee Albino MD  CC:  F/U ED visit , C/O  burning and painful urination, ?  ringworm, and .  History of Present Illness: Was seen in ER for chest pain/painful breathing.  Dx was pleurisy. Went home and went to bed.  When awoke, chest pain was gone. C/O burning on urination.   Still with rash, previously diagnosed with ringworm Has used two tube of cream without benefit. Depressed with chronic health problems  Habits & Providers  Alcohol-Tobacco-Diet     Tobacco Status: current     Tobacco Counseling: to quit use of tobacco products     Cigarette Packs/Day: 1.0  Current Medications (verified): 1)  Aspirin Childrens 81 Mg Chew (Aspirin) .... Take 1 Tablet By Mouth Once A Day 2)  Levoxyl 175 Mcg Tabs (Levothyroxine Sodium) .... Take 1 Tablet By Mouth Once A Day 3)  Nitroglycerin 0.4 Mg Subl (Nitroglycerin) .... Place 1 Tablet Under Tongue As Directed 4)  Eq Omeprazole 20 Mg  Tbec (Omeprazole) .... One By Mouth Two Times A Day 5)  Hydrochlorothiazide 12.5 Mg  Tabs (Hydrochlorothiazide) .... Take 1 Tab  By Mouth Every Morning 6)  Simvastatin 80 Mg Tabs (Simvastatin) .Marland Kitchen.. 1 By Mouth At Bedtime 7)  Ranitidine Hcl 150 Mg Caps (Ranitidine Hcl) .... One Two Times A Day 8)  Gabapentin 300 Mg Caps (Gabapentin) .... One By Mouth Two Times A Day X 1 Week Then One By Mouth Three Times A Day Thereafter 9)  Nortriptyline Hcl 50 Mg Caps (Nortriptyline Hcl) .... One By Mouth Daily For One Week, Then One By Mouth Two Times A Day  For One Week, Then One By Mouth Three Times A Day Thereafter 10)  Cephalexin 500 Mg Caps (Cephalexin) .... One By Mouth Two Times A Day 11)  Diflucan 150 Mg Tabs (Fluconazole) .... Once Daily For Yeast Infection - Do Not Take Until Finished With Antibiotics 12)  Ketoconazole 2 % Crea (Ketoconazole) .... Apply To Rash Two Times A Day Disp 30 Gram Tube For Ringworm 13)  Macrodantin 100 Mg Caps (Nitrofurantoin Macrocrystal) .... One By Mouth Two Times A Day  Allergies: 1)  ! Codeine 2)  ! * Contrast Dye 3)  Codeine Phosphate (Codeine Phosphate) 4)  * Ivp Dye 5)  Keflex  Past History:  Past medical, surgical, family and social histories (including risk factors) reviewed, and no changes noted (except as noted below).  Past Medical History: Reviewed history from 10/08/2009 and no changes required. 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 Couldn't see bottom part of heart." Diaphargmatic attenuation??? Normal cath 05/2006  Relative tachycardia Meds: ASA 81 mg daily Ibuprofen 200 mg QID as needed pain - only takes every now and then Levoxyl 175 mg daily Nortriptyline 25 mg three times a day - was taking for foot/ankle pain but was not helping so just stopped 1 week ago Omeprazole magnesium EC delayed release 20 mg daily  Past Surgical History: Reviewed history from 03/30/2007 and no changes required. 11-24-06 TC 185, TG 136, HDL 48, LDL 110 - 11/27/2006,  back surg x 10  - 12/06/2004, cardiolyte stress test nl EF: nl - 08/07/2006,  T score: -0.58 - 12/14/2006  Family History: Reviewed history from 03/30/2007 and no changes required. Father and brother MIs - also  had HTN, DM, CHF Ca, CAD, CVA, DM Strongly pos for DM  Social History: Reviewed history from 03/30/2007 and no changes required. Lives at home by herself. husband died 2005-04-13 of MI. Has 2 daughters and 3 grandchildren. Is a retired Librarian, academic with elderly and handicapped. Exercises by walking 1 mile per day. No tobacco now but is a past smoker 45 pack year history. No drugs. Packs/Day:  1.0  Physical Exam  General:  Well-developed,well-nourished,in no acute distress; alert,appropriate and cooperative throughout examination Lungs:  Normal respiratory effort, chest expands symmetrically. Lungs are clear to auscultation, no crackles or wheezes. Heart:  Normal rate and regular rhythm. S1 and S2 normal without gallop, murmur, click, rub or other extra sounds. Skin:  rash typical of ringworm or numular eczema Psych:  teary in office, denies SI/HI   Impression & Recommendations:  Problem # 1:  ESSENTIAL HYPERTENSION, BENIGN (ICD-401.1) Assessment Unchanged  Given low K in ER will recheck as a future order. Her updated medication list for this problem includes:    Hydrochlorothiazide 12.5 Mg Tabs (Hydrochlorothiazide) .Marland Kitchen... Take 1 tab  by mouth every morning  BP today: 141/85 Prior BP: 126/85 (01/27/2010)  Prior 10 Yr Risk Heart Disease: 7 % (12/23/2009)  Labs Reviewed: K+: 4.3 (10/16/2009) Creat: : 0.61 (10/16/2009)   Chol: 204 (12/10/2008)   HDL: 57 (12/10/2008)   LDL: 106 (12/10/2008)   TG: 204 (12/10/2008)  Problem # 2:  HYPERCHOLESTEROLEMIA (ICD-272.0)  Due for recheck Her updated medication list for this problem includes:    Simvastatin 80 Mg Tabs (Simvastatin) .Marland Kitchen... 1 by mouth at bedtime  Orders: [facility]- Est  Level 4 ([address]214)Future Orders: T-Lipid Profile ([address]-60454) ... 04/08/2011 Comp Met-[facility] (09811-91478) ... 04/08/2010  Labs Reviewed: SGOT: 27 (10/16/2009)   SGPT: 31 (10/16/2009)  Lipid Goals: Chol Goal: 200 (01/04/2008)   HDL Goal: 40 (01/04/2008)   LDL  Goal: 70 (01/04/2008)   TG Goal: 150 (01/04/2008)  Prior 10 Yr Risk Heart Disease: 7 % (12/23/2009)   HDL:57 (12/10/2008), 48 (11/24/2006)  LDL:106 (12/10/2008), 110 (11/24/2006)  Chol:204 (12/10/2008), 185 (11/24/2006)  Trig:204 (12/10/2008), 136 (11/24/2006)  Problem # 3:  DYSURIA (ICD-788.1) UTI.  After I had sent keflex, she informed me that "burns her stomach."  Switched to macrodantin Her updated medication list for this problem includes:    Cephalexin 500 Mg Caps (Cephalexin) ..... One by mouth two times a day    Macrodantin 100 Mg Caps (Nitrofurantoin macrocrystal) ..... One by mouth two times a day  Orders: Urinalysis-[facility] (00000) [facility]- Est  Level 4 (29562)  Problem # 4:  SKIN RASH (ICD-782.1) Still KOH neg but will treat for tinea anyway given she has failed steroid cream rx. The following medications were  removed from the medication list:    Triamcinolone Acetonide 0.1 % Crea (Triamcinolone acetonide) .Marland Kitchen... Apply to rash two times a day disp 30 gram tube. Her updated medication list for this problem includes:    Ketoconazole 2 % Crea (Ketoconazole) .Marland Kitchen... Apply to rash two times a day disp 30 gram tube for ringworm  Orders: KOH-[facility] ([address]109)  Problem # 5:  BACK PAIN W/RADIATION, UNSPECIFIED (ICD-724.4) Assessment: Deteriorated  increase gabapentin, add nortryptaline Her updated medication list for this problem includes:    Aspirin Childrens 81 Mg Chew (Aspirin) .Marland Kitchen... Take 1 tablet by mouth once a day  Orders: [facility]- Est  Level 4 (60454)  Problem # 6:  DEPRESSION (ICD-311)  Add nortryptaline Her updated medication list for this problem includes:    Nortriptyline Hcl 50 Mg Caps (Nortriptyline hcl) ..... One by mouth daily for one week, then one by mouth two times a day for one week, then one by mouth three times a day thereafter  Orders: [facility]- Est  Level 4 (09811)  Complete Medication List: 1)  Aspirin Childrens 81 Mg Chew (Aspirin) .... Take 1 tablet by mouth once a  day 2)  Levoxyl 175 Mcg Tabs (Levothyroxine sodium) .... Take 1 tablet by mouth once a day 3)  Nitroglycerin 0.4 Mg Subl (Nitroglycerin) .... Place 1 tablet under tongue as directed 4)  Eq Omeprazole 20 Mg Tbec (Omeprazole) .... One by mouth two times a day 5)  Hydrochlorothiazide 12.5 Mg Tabs (Hydrochlorothiazide) .... Take 1 tab  by mouth every morning 6)  Simvastatin 80 Mg Tabs (Simvastatin) .Marland Kitchen.. 1 by mouth at bedtime 7)  Ranitidine Hcl 150 Mg Caps (Ranitidine hcl) .... One two times a day 8)  Gabapentin 300 Mg Caps (Gabapentin) .... One by mouth two times a day x 1 week then one by mouth three times a day thereafter 9)  Nortriptyline Hcl 50 Mg Caps (Nortriptyline hcl) .... One by mouth daily for one week, then one by mouth two times a day for one week, then one by mouth three times a day thereafter 10)  Cephalexin 500 Mg Caps (Cephalexin) .... One by mouth two times a day 11)  Diflucan 150 Mg Tabs (Fluconazole) .... Once daily for yeast infection - do not take until finished with antibiotics 12)  Ketoconazole 2 % Crea (Ketoconazole) .... Apply to rash two times a day disp 30 gram tube for ringworm 13)  Macrodantin 100 Mg Caps (Nitrofurantoin macrocrystal) .... One by mouth two times a day  Patient Instructions: 1)  First take the gabapentin at a new dose build to three times per day and be on that for a full week before start other new drug. 2)  Second start new drug nortyptaline which will hopefully help with pain and depression.  This one takes time to work. Prescriptions: MACRODANTIN 100 MG CAPS (NITROFURANTOIN MACROCRYSTAL) one by mouth two times a day  #6 x 0   Entered and Authorized by:   Doralee Albino MD   Signed by:   Doralee Albino MD on 03/12/2010   Method used:   Electron[facility] to        [id number] ([facility]ail)       [address]       [place], [place]  [address]       Ph: [phone]       Fax: [phone]   RxID:    [id number] KETOCONAZOLE 2 % CREA (KETOCONAZOLE) apply  to rash two times a day Disp 30 gram tube for ringworm  #30 x 0   Entered and Authorized by:   Doralee Albino MD   Signed by:   Doralee Albino MD on 03/12/2010   Method used:   [facility] to        [facility] [address] [id number] ([facility]       [address]       [place], [place]  [address]       Ph: [phone]       Fax: [phone]   RxID:   [id number] DIFLUCAN 150 MG TABS (FLUCONAZOLE) once daily for yeast infection - do not take until finished with antibiotics  #1 x 0   Entered and Authorized by:   Doralee Albino MD   Signed by:   Doralee Albino MD on 03/12/2010   Method used:   [facility]        [facility]  [address] [id number]       [address]       [place], [place]  [address]       Ph: [phone]       Fax: [phone]   RxID:   [id number] CEPHALEXIN 500 MG CAPS (CEPHALEXIN) one by mouth two times a day  #6 x 0   Entered and Authorized by:   Doralee Albino MD   Signed by:   Doralee Albino MD on 03/12/2010   Method used:   [facility] to        [facility] [address] [id number]       [address]       [place], [place]  [address]       Ph: [phone]       Fax: [phone]   RxID:   [id number] NORTRIPTYLINE HCL 50 MG CAPS (NORTRIPTYLINE HCL) One by mouth daily for one week, then one by mouth two times a day for one week, then one by mouth three times a day thereafter  #90 x 12   Entered and Authorized by:   Doralee Albino MD   Signed by:   Doralee Albino MD on 03/12/2010   Method used:   [facility]        [facility] [address] [id number] ([facility]       [address]       [place], [place]  [address]       Ph: [phone]       Fax: [phone]   RxID:   [id number] GABAPENTIN 300 MG CAPS (GABAPENTIN) one by mouth two times a day x 1 week then one by mouth three times a day  thereafter  #90 x 12   Entered and Authorized by:   Doralee Albino MD   Signed by:   Doralee Albino MD on 03/12/2010   Method used:   [facility]        [facility] [address] [facility]       [address]       [place], [place]  [address]       Ph: [phone]       Fax: [phone]   RxID:   [id number]   Laboratory Results   Urine Tests  Date/Time Received: Mar 12, 2010 1:47 PM  Date/Time Reported: Mar 12, 2010 2:09 PM   Routine Urinalysis   Color: yellow Appearance: Clear Glucose: negative   (Normal Range: Negative) Bilirubin: negative   (Normal Range: Negative) Ketone: negative   (Normal Range: Negative) Spec. Gravity: 1.015   (Normal Range: 1.003-1.035) Blood: trace-intact   (Normal Range: Negative) pH: 6.5   (Normal Range: 5.0-8.0) Protein: negative   (Normal Range: Negative) Urobilinogen: 0.2   (Normal Range: 0-1) Nitrite: negative   (Normal Range: Negative) Leukocyte Esterace: moderate   (Normal Range: Negative)  Urine Microscopic WBC/HPF: >20 with few clumps RBC/HPF: occ Bacteria/HPF: trace Epithelial/HPF: 1-5    Comments: ...............test performed by......Marland KitchenBonnie A. [place], MLS (ASCP)cm  Date/Time Received: Mar 12, 2010 2:00 PM  Date/Time Reported: Mar 12, 2010 2:26 PM   Other Tests  Skin KOH: Negative Comments: ...............test performed by......Marland KitchenBonnie A. [place], MLS (ASCP)cm      Prevention & Chronic Care Immunizations   Influenza vaccine: Historical  (08/04/2009)   Influenza vaccine due: 07/08/2010    Tetanus booster: 04/07/2004: Done.   Tetanus booster due: 04/07/2014    Pneumococcal vaccine: Done.  (09/07/2006)   Pneumococcal vaccine due: None  Colorectal Screening   Hemoccult: Not documented   Hemoccult due: Not Indicated    Colonoscopy: abnormal  (05/02/2008)   Colonoscopy due: 05/02/2018  Other Screening   Pap smear: Done.  (06/07/2006)   Pap smear due: Not Indicated     Mammogram: ASSESSMENT: Benign - BI-RADS 2^MM DIGITAL SCREENING  (08/21/2009)   Mammogram due: 08/21/2010   Smoking status: current  (03/12/2010)   Smoking cessation counseling: yes  (12/15/2008)  Lipids   Total Cholesterol: 204  (12/10/2008)   Lipid panel action/deferral: Lipid Panel ordered   LDL: 106  (12/10/2008)   LDL Direct: Not documented   HDL: 57  (12/10/2008)   Triglycerides: 204  (12/10/2008)    SGOT (AST): 27  (10/16/2009)   SGPT (ALT): 31  (10/16/2009) CMP ordered    Alkaline phosphatase: 92  (10/16/2009)   Total bilirubin: 0.3  (09/25/2009)    Lipid flowsheet reviewed?: Yes   Progress toward LDL goal: At goal  Hypertension   Last Blood Pressure: 141 / 85  (03/12/2010)   Serum creatinine: 0.61  (10/16/2009)   Serum potassium 4.3  (10/16/2009) CMP ordered     Hypertension flowsheet reviewed?: Yes   Progress toward BP goal: At goal  Self-Management Support :   Personal Goals (by the next clinic visit) :      Personal blood pressure goal: 140/90  (08/14/2009)     Personal LDL goal: 130  (08/14/2009)    Hypertension self-management support: Written self-care plan  (03/12/2010)   Hypertension self-care plan printed.    Lipid self-management support: Written self-care plan  (03/12/2010)   Lipid self-care plan printed.

## 2010-12-07 NOTE — Progress Notes (Signed)
Summary: Records request from [facility]  Request for records received from [facility]. Request forwarded to [facility]. Wilder Glade  January 13, 2010 12:26 PM

## 2010-12-07 NOTE — Miscellaneous (Signed)
Summary: ER visit   Clinical Lists Changes Received fax from ER where Danielle [name] was seen 5/3 with chest pain.  Had CXR, lots of bloodwork including cardiac markers and D dimer.  DCed from ER with final diagnosis of pleurisy.

## 2010-12-07 NOTE — Miscellaneous (Signed)
Summary: med refill via phone request  Medications Added TRAMADOL HCL 50 MG TABS (TRAMADOL HCL) one by mouth q6h as needed pain       Clinical Lists Changes  Medications: Added new medication of TRAMADOL HCL 50 MG TABS (TRAMADOL HCL) one by mouth q6h as needed pain - Signed Rx of TRAMADOL HCL 50 MG TABS (TRAMADOL HCL) one by mouth q6h as needed pain;  #60 x 4;  Signed;  Entered by: Doralee Albino MD;  Authorized by: Doralee Albino MD;  Method used: [facility] [address] [facility] [address] [place], [place], [place]  [address], Ph: [phone], Fax: [phone]    Prescriptions: TRAMADOL HCL 50 MG TABS (TRAMADOL HCL) one by mouth q6h as needed pain  #60 x 4   Entered and Authorized by:   Doralee Albino MD   Signed by:   Doralee Albino MD on 07/23/2010   Method used:   [facility]        [facility] [address] [facility]       [address]       [place], [place]  [address]       Ph: [phone]       Fax: [phone]   RxID:   [id number]

## 2010-12-07 NOTE — Miscellaneous (Signed)
Summary: LABS CMP,10/16/2009  Clinical Lists Changes  Observations: Added new observation of CALCIUM: 10.9 mg/dL (09/03/2535 64:40) Added new observation of ALBUMIN: 4.3 g/dL (34/74/2595 63:87) Added new observation of PROTEIN, TOT: 7.0 g/dL (56/43/3295 18:84) Added new observation of SGPT (ALT): 31 units/L (10/16/2009 15:10) Added new observation of SGOT (AST): 27 units/L (10/16/2009 15:10) Added new observation of ALK PHOS: 92 units/L (10/16/2009 15:10) Added new observation of CREATININE: 0.61 mg/dL (16/60/6301 60:10) Added new observation of BUN: 12 mg/dL (93/23/5573 22:02) Added new observation of BG RANDOM: 97 mg/dL (54/27/0623 76:28) Added new observation of CO2 PLSM/SER: 26 meq/L (10/16/2009 15:10) Added new observation of CL SERUM: 103 meq/L (10/16/2009 15:10) Added new observation of K SERUM: 4.3 meq/L (10/16/2009 15:10) Added new observation of NA: 135 meq/L (10/16/2009 15:10)

## 2010-12-07 NOTE — Assessment & Plan Note (Signed)
 Summary: see triage note/[facility] pt   Vital Signs:  Patient profile:   55 year old female Height:      62.25 inches Weight:      182.9 pounds BMI:     33.30 Temp:     97.8 degrees F oral Pulse rate:   90 / minute BP sitting:   124 / 85  (left arm)  Vitals Entered By: Olam Keeling (September 21, 2009 11:01 AM) Is Patient Diabetic? No Pain Assessment Patient in pain? yes     Location: abdomen Intensity: 8 Type: sharp Onset of pain  6 days ago   Primary Care Provider:  ELSIE PAO MD   History of Present Illness: 34F with approx one week history of RUQ abd pain, seen in ED and Dx with UTI, DCed with macrobid , Cx grew out e-coli but insig # of colonies.  Pain persistent, RUQ, colicky, worse with food and water, no radiation, associated with nausea, no vomiting, occasional diarrhea associated but defeacating does not improve symptoms.  CBC with WBC 11.5 in ER, OW WNL, CMET neg, lipase neg, Abd US  neg except for cyst at pancreatic head, no gallstones.  Pt has history of GERD but is already on PPI and H2 blocker.    Of note had negative cardiac cath one year ago so cardiac etiology unlikely.  Habits & Providers  Alcohol-Tobacco-Diet     Tobacco Status: current     Tobacco Counseling: to quit use of tobacco products     Cigarette Packs/Day: 0.5  Allergies: 1)  ! Codeine 2)  ! 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 Couldn't see bottom part of heart. Diaphargmatic attenuation???  Meds: ASA 81 mg  daily Ibuprofen  200 mg QID as needed pain - only takes every now and then Levoxyl  175 mg daily Nortriptyline  25 mg three times a day - was taking for foot/ankle pain but was not helping so just stopped 1 week ago Omeprazole  magnesium EC delayed release 20 mg daily  Past Surgical History: Last updated: 03/30/2007 11-24-06 TC 185, TG 136, HDL 48, LDL 110 - 11/27/2006,  back surg x 10  - 12/06/2004, cardiolyte stress test nl EF: nl - 08/07/2006,  T score: -0.58 - 12/14/2006  Family History: Last updated: 03/30/2007 Father and brother MIs - also had HTN, DM, CHF Ca, CAD, CVA, DM Strongly pos for DM  Social History: Last updated: 03/30/2007 Lives at home by herself. husband died 10/17/05 of MI. Has 2 daughters and 3 grandchildren. Is a retired Librarian, Academic with elderly and handicapped. Exercises by walking 1 mile per day. No tobacco now but is a past smoker 45 pack year history. No drugs.   Physical Exam  General:  Well-developed,well-nourished,in no acute distress; alert,appropriate and cooperative throughout examination Lungs:  Normal respiratory effort, chest expands symmetrically. Lungs are clear to auscultation, no crackles or wheezes. Heart:  Normal rate and regular rhythm. S1 and S2 normal without gallop, murmur, click, rub or other extra sounds. Abdomen:  Soft, non distended, TTP RUQ and  epigastrium with guarding in these regions.  positive murphys sign, otherwise no masses, normal BS, no CVAT, no suprapubic tenderness. Additional Exam:  D/W Dr. Lily @ [facility], agrees with HIDA.   Impression & Recommendations:  Problem # 1:  ABDOMINAL PAIN, RIGHT UPPER QUADRANT (ICD-789.01) Assessment New This appears to be gallbladder in etiology but neg abd US .  Acalculous cholecystitis/biliary sludge is still a possibility, as is possible mass effect from the pancreatic head cyst.  I dont think any further bloodwork is indicated now but will check a HIDA scan with CCK injection.  This was  discussed with [facility] and they agree with the plan.  UA showed persistent LE, will change Abx to keflex and await culture of this urine specimen (epis present in urine).  Already on PPI and H2 blocker so doubt gastritis is contributing.  IBS is also a possibility with associated urge to defeacate but will rule out gallbladder first.  Will give toradol  30 IM today.  Can use OTC analgesics. Pt to RTC immediately after HIDA to go over results.  Orders: Urinalysis-[facility] (00000) Urine Culture-[facility] ([id number]) Nuclear Medicine (Nuclear Medicine) [facility]- Est  Level 4 (00785)  Problem # 2:  CYST AND PSEUDOCYST OF PANCREAS (ICD-577.2) Assessment: New This may need to be followed up with further imagining (CT with by mouth contrast, no IVC as she is allergic) after further workup of her gallbladder.  Complete Medication List: 1)  Aspirin  Childrens 81 Mg Chew (Aspirin ) .... Take 1 tablet by mouth once a day 2)  Levoxyl  175 Mcg Tabs (Levothyroxine  sodium) .... Take 1 tablet by mouth once a day 3)  Nitroglycerin  0.4 Mg Subl (Nitroglycerin ) .... Place 1 tablet under tongue as directed 4)  Eq Omeprazole  20 Mg Tbec (Omeprazole ) .... One by mouth two times a day 5)  Hydrochlorothiazide  12.5 Mg Tabs (Hydrochlorothiazide ) .... Take 1 tab  by mouth every morning 6)  Simvastatin  80 Mg Tabs (Simvastatin ) .SABRA.. 1 by mouth at bedtime 7)  Calcium  500/d 500-400 Mg-unit Chew (Calcium -vitamin d ) .... One by mouth two times a day 8)  Ranitidine Hcl 150 Mg Caps (Ranitidine hcl) .... One two times a day 9)  Promethazine  Hcl 25 Mg Tabs (Promethazine  hcl) .... One q 6 hours as needed for nausea 10)  Keflex 500 Mg Caps (Cephalexin) .... One tab by mouth two times a day x 7 days. 11)  Zofran  Odt 4 Mg Tbdp (Ondansetron ) .... Let dissolve in mouth q8h as needed for nausea.  Patient Instructions: 1)  Great to meet you, 2)  I want to check a HIDA scan. 3)  Also your urine is still consistent with a UTI, I want to change your  antibiotics to Keflex x 7 days. 4)  Come back to see me immediately after your HIDA scan. 5)  -Dr. ONEIDA. Prescriptions: ZOFRAN  ODT 4 MG TBDP (ONDANSETRON ) Let dissolve in mouth q8h as needed for nausea.  #30 x 0   Entered and Authorized by:   Debby Petties MD   Signed by:   Debby Petties MD on 09/21/2009   Method used:   Electronically to        [facility]  [address] [phone]* (retail)       [address]       [place], [place]  [address]       Ph: [phone]       Fax: [phone]   RxID:   [id number] KEFLEX 500 MG CAPS (CEPHALEXIN)  One tab by mouth two times a day x 7 days.  #14 x 0   Entered and Authorized by:   Debby Petties MD   Signed by:   Debby Petties MD on 09/21/2009   Method used:   [facility]        [facility]  [address] [phone][facility] (retail)       [address]       [place], [place]  [address]       Ph: [phone]       Fax: [phone]   RxID:   [id number]   Laboratory Results   Urine Tests  Date/Time Received: September 21, 2009 11:41 AM  Date/Time Reported: September 21, 2009 12:01 PM   Routine Urinalysis   Color: yellow Appearance: Clear Glucose: negative   (Normal Range: Negative) Bilirubin: negative   (Normal Range: Negative) Ketone: negative   (Normal Range: Negative) Spec. Gravity: 1.015   (Normal Range: 1.003-1.035) Blood: trace-intact   (Normal Range: Negative) pH: 6.5   (Normal Range: 5.0-8.0) Protein: negative   (Normal Range: Negative) Urobilinogen: 0.2   (Normal Range: 0-1) Nitrite: negative   (Normal Range: Negative) Leukocyte Esterace: moderate   (Normal Range: Negative)  Urine Microscopic WBC/HPF: 5-15 RBC/HPF: 1-5 Bacteria/HPF: 2+ Mucous/HPF: 1+ Epithelial/HPF: 10-20    Comments: ...............test performed by......SABRABonnie A. Jordan, MLS (ASCP)cm       Appended Document: see triage note/[facility] pt   Medication Administration  Injection #  1:    Medication: Ketorolac -Toradol  15mg     Diagnosis: ABDOMINAL PAIN, RIGHT UPPER QUADRANT (ICD-789.01)    Route: IM    Site: LUOQ gluteus    Exp Date: 06/08/2011    Lot #: [id number]    Mfr: Novaplus    Comments: Toradol  30 mg given IM.    Patient tolerated injection without complications    Given by: Avelina Sharps RN (September 21, 2009 1:53 PM)  Orders Added: 1)  Ketorolac -Toradol  15mg  [G8114]

## 2010-12-07 NOTE — Assessment & Plan Note (Signed)
Summary: f/up,tcb   Vital Signs:  Patient profile:   55 year old female Height:      62.25 inches Weight:      179.9 pounds BMI:     32.76 Temp:     98.7 degrees F oral Pulse rate:   93 / minute BP sitting:   126 / 85  (left arm) Cuff size:   regular  Vitals Entered By: Gladstone Pih (January 27, 2010 4:26 PM) CC: F/U Is Patient Diabetic? No Comments C/O preassure and blood with urination   Primary Care Provider:  Doralee Albino MD  CC:  F/U.  History of Present Illness: Had another bout of back pain.  Seeing Nitka Nex broad based HNP.  Being treated medically.  Thought had kidney stone one week ago.  Now with just slight pressure on urination.  No dysuria , urgency or frequency.    Concern about possible ringworm.  Multi family members.  Hers has been refractory to multiple OTC treatments including lotrimen.     Habits & Providers  Alcohol-Tobacco-Diet     Tobacco Status: current     Tobacco Counseling: to quit use of tobacco products     Cigarette Packs/Day: 0.5  Current Medications (verified): 1)  Aspirin Childrens 81 Mg Chew (Aspirin) .... Take 1 Tablet By Mouth Once A Day 2)  Levoxyl 175 Mcg Tabs (Levothyroxine Sodium) .... Take 1 Tablet By Mouth Once A Day 3)  Nitroglycerin 0.4 Mg Subl (Nitroglycerin) .... Place 1 Tablet Under Tongue As Directed 4)  Eq Omeprazole 20 Mg  Tbec (Omeprazole) .... One By Mouth Two Times A Day 5)  Hydrochlorothiazide 12.5 Mg  Tabs (Hydrochlorothiazide) .... Take 1 Tab  By Mouth Every Morning 6)  Simvastatin 80 Mg Tabs (Simvastatin) .Marland Kitchen.. 1 By Mouth At Bedtime 7)  Ranitidine Hcl 150 Mg Caps (Ranitidine Hcl) .... One Two Times A Day 8)  Gabapentin 100 Mg Caps (Gabapentin) .... One By Mouth Daily For One Week Then One By Mouth Two Times A Day For One Week Then One By Mouth Three Times A Day Thereafter. 9)  Triamcinolone Acetonide 0.1 % Crea (Triamcinolone Acetonide) .... Apply To Rash Two Times A Day Disp 30 Gram Tube.  Allergies  (verified): 1)  ! Codeine 2)  ! * Contrast Dye 3)  Codeine Phosphate (Codeine Phosphate) 4)  * Ivp Dye  Past History:  Past medical, surgical, family and social histories (including risk factors) reviewed, and no changes noted (except as noted below).  Past Medical History: Reviewed history from 10/08/2009 and no changes required. 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 Couldn't see bottom part of heart." Diaphargmatic attenuation??? Normal cath 05/2006  Relative tachycardia Meds: ASA 81 mg daily Ibuprofen 200 mg QID as needed pain - only takes every now and then Levoxyl 175 mg daily Nortriptyline 25 mg three times a day - was taking for foot/ankle pain but was not helping so just stopped 1 week ago Omeprazole magnesium EC delayed release 20 mg daily  Past Surgical History: Reviewed history from 03/30/2007 and no changes required. 11-24-06 TC 185, TG 136, HDL 48, LDL 110 - 11/27/2006,  back surg x 10  -  12/06/2004, cardiolyte stress test nl EF: nl - 08/07/2006,  T score: -0.58 - 12/14/2006  Family History: Reviewed history from 03/30/2007 and no changes required. Father and brother MIs - also had HTN, DM, CHF Ca, CAD, CVA, DM Strongly pos for DM  Social History: Reviewed history from 03/30/2007 and no changes required. Lives at home by herself. husband died 03/24/2005 of MI. Has 2 daughters and 3 grandchildren. Is a retired Librarian, academic with elderly and handicapped. Exercises by walking 1 mile per day. No tobacco now but is a past smoker 45 pack year history. No drugs. Smoking Status:  current  Review of Systems  The patient denies anorexia, fever,  chest pain, and abdominal pain.    Physical Exam  General:  Well-developed,well-nourished,in no acute distress; alert,appropriate and cooperative throughout examination Msk:  Limited ROM of lumbar spine Neurologic:  Hyperestesia of both feet. Skin:  Dry scaley rash on Lt thigh and Right ant abdomen.  KOH neg   Impression & Recommendations:  Problem # 1:  SKIN RASH (ICD-782.1) Rx as eczema due to neg KOH Her updated medication list for this problem includes:    Triamcinolone Acetonide 0.1 % Crea (Triamcinolone acetonide) .Marland Kitchen... Apply to rash two times a day disp 30 gram tube.  Orders: KOH-[facility] (16109) [facility]- Est Level  3 (60454)  Problem # 2:  BACK PAIN W/RADIATION, UNSPECIFIED (ICD-724.4)  With leg pain and hyperesthesia.  Feel pos neuropathic pain, begin gabapentin and titrate up. Her updated medication list for this problem includes:    Aspirin Childrens 81 Mg Chew (Aspirin) .Marland Kitchen... Take 1 tablet by mouth once a day  Orders: [facility]- Est Level  3 (09811)  Complete Medication List: 1)  Aspirin Childrens 81 Mg Chew (Aspirin) .... Take 1 tablet by mouth once a day 2)  Levoxyl 175 Mcg Tabs (Levothyroxine sodium) .... Take 1 tablet by mouth once a day 3)  Nitroglycerin 0.4 Mg Subl (Nitroglycerin) .... Place 1 tablet under tongue as directed 4)  Eq Omeprazole 20 Mg Tbec (Omeprazole) .... One by mouth two times a day 5)  Hydrochlorothiazide 12.5 Mg Tabs (Hydrochlorothiazide) .... Take 1 tab  by mouth every morning 6)  Simvastatin 80 Mg Tabs (Simvastatin) .Marland Kitchen.. 1 by mouth at bedtime 7)  Ranitidine Hcl 150 Mg Caps (Ranitidine hcl) .... One two times a day 8)  Gabapentin 100 Mg Caps (Gabapentin) .... One by mouth daily for one week then one by mouth two times a day for one week then one by mouth three times a day thereafter. 9)  Triamcinolone Acetonide 0.1 % Crea (Triamcinolone acetonide) .... Apply to rash two times a day disp 30 gram tube.  Other Orders: Urinalysis-[facility] (00000)  Patient  Instructions: 1)  The new medicine, gabapentin, is for neuropathic (nerve) pain.  Call me after one month and let me know how the pain is doing.  Also tell me how the rash is doing. Prescriptions: TRIAMCINOLONE ACETONIDE 0.1 % CREA (TRIAMCINOLONE ACETONIDE) Apply to rash two times a day Disp 30 gram tube.  #30 x 1   Entered and Authorized by:   Doralee Albino MD   Signed by:   Doralee Albino MD on 01/27/2010   Method used:   [facility]        [facility]  [address] [id number]       [address]       [place], [place]  [address]  Ph: [phone]       Fax: [phone]   RxID:   [id number] GABAPENTIN 100 MG CAPS (GABAPENTIN) one by mouth daily for one week then one by mouth two times a day for one week then one by mouth three times a day thereafter.  #90 x 3   Entered and Authorized by:   Doralee Albino MD   Signed by:   Doralee Albino MD on 01/27/2010   Method used:   [facility]        [facility]  [address] [id number]       [address]       [place], [place]  [address]       Ph: [phone]       Fax: [phone]   RxID:   [id number]    Laboratory Results   Urine Tests  Date/Time Received: January 27, 2010 4:47 PM  Date/Time Reported: January 27, 2010 5:01 PM   Routine Urinalysis   Color: yellow Appearance: Clear Glucose: negative   (Normal Range: Negative) Bilirubin: negative   (Normal Range: Negative) Ketone: negative   (Normal Range: Negative) Spec. Gravity: >=1.030   (Normal Range: 1.003-1.035) Blood: negative   (Normal Range: Negative) pH: 5.5   (Normal Range: 5.0-8.0) Protein: negative   (Normal Range: Negative) Urobilinogen: 0.2   (Normal Range: 0-1) Nitrite: negative   (Normal Range: Negative) Leukocyte Esterace: small   (Normal Range: Negative)  Urine Microscopic WBC/HPF: 1-5 RBC/HPF: 0-3 Bacteria/HPF: trace Mucous/HPF: 1+ Epithelial/HPF: 5-10    Comments: ...........test  performed by...........Marland KitchenTerese Door, CMA  Date/Time Received: January 27, 2010 5:00 PM  Date/Time Reported: January 27, 2010 5:11 PM   Other Tests  Skin KOH: Negative Comments: ...............test performed by......Marland KitchenBonnie A. [place], MLS (ASCP)cm      Prevention & Chronic Care Immunizations   Influenza vaccine: Historical  (08/04/2009)   Influenza vaccine due: 07/08/2010    Tetanus booster: 04/07/2004: Done.   Tetanus booster due: 04/07/2014    Pneumococcal vaccine: Done.  (09/07/2006)   Pneumococcal vaccine due: None  Colorectal Screening   Hemoccult: Not documented   Hemoccult due: Not Indicated    Colonoscopy: abnormal  (05/02/2008)   Colonoscopy due: 05/02/2018  Other Screening   Pap smear: Done.  (06/07/2006)   Pap smear due: Not Indicated    Mammogram: ASSESSMENT: Benign - BI-RADS 2^MM DIGITAL SCREENING  (08/21/2009)   Mammogram due: 08/21/2010   Smoking status: current  (01/27/2010)   Smoking cessation counseling: yes  (12/15/2008)  Lipids   Total Cholesterol: 204  (12/10/2008)   LDL: 106  (12/10/2008)   LDL Direct: Not documented   HDL: 57  (12/10/2008)   Triglycerides: 204  (12/10/2008)    SGOT (AST): 27  (10/16/2009)   SGPT (ALT): 31  (10/16/2009)   Alkaline phosphatase: 92  (10/16/2009)   Total bilirubin: 0.3  (09/25/2009)    Lipid flowsheet reviewed?: Yes   Progress toward LDL goal: At goal  Hypertension   Last Blood Pressure: 126 / 85  (01/27/2010)   Serum creatinine: 0.61  (10/16/2009)   Serum potassium 4.3  (10/16/2009)    Hypertension flowsheet reviewed?: Yes   Progress toward BP goal: At goal  Self-Management Support :   Personal Goals (by the next clinic visit) :      Personal blood pressure goal: 140/90  (08/14/2009)     Personal LDL goal: 130  (08/14/2009)    Hypertension self-management support: Written self-care plan  (01/27/2010)  Hypertension self-care plan printed.    Lipid self-management support: Written self-care  plan  (01/27/2010)   Lipid self-care plan printed.

## 2011-01-25 LAB — BASIC METABOLIC PANEL
BUN: 9 mg/dL (ref 6–23)
CO2: 28 mEq/L (ref 19–32)
Chloride: 102 mEq/L (ref 96–112)
Glucose, Bld: 150 mg/dL — ABNORMAL HIGH (ref 70–99)
Potassium: 2.8 mEq/L — ABNORMAL LOW (ref 3.5–5.1)

## 2011-01-25 LAB — POCT CARDIAC MARKERS
CKMB, poc: 1.3 ng/mL (ref 1.0–8.0)
CKMB, poc: 2 ng/mL (ref 1.0–8.0)
Troponin i, poc: <0.05 ng/mL (ref 0.00–0.09)
Troponin i, poc: <0.05 ng/mL (ref 0.00–0.09)

## 2011-01-25 LAB — CBC
HCT: 41.7 % (ref 36.0–46.0)
MCHC: 35.1 g/dL (ref 30.0–36.0)
MCV: 89 fL (ref 78.0–100.0)
Platelets: 305 10*3/uL (ref 150–400)
RDW: 13.7 % (ref 11.5–15.5)

## 2011-01-25 LAB — DIFFERENTIAL
Basophils Absolute: 0 10*3/uL (ref 0.0–0.1)
Eosinophils Absolute: 0.3 10*3/uL (ref 0.0–0.7)
Eosinophils Relative: 3 % (ref 0–5)

## 2011-01-25 LAB — PROTIME-INR: Prothrombin Time: 13.4 seconds (ref 11.6–15.2)

## 2011-01-26 ENCOUNTER — Other Ambulatory Visit: Payer: Self-pay | Admitting: Family Medicine

## 2011-01-26 MED ORDER — HYDROCHLOROTHIAZIDE 12.5 MG PO CAPS: 12.5000 mg | ORAL_CAPSULE | ORAL | Status: DC

## 2011-02-09 LAB — DIFFERENTIAL
Basophils Relative: 1 % (ref 0–1)
Lymphocytes Relative: 28 % (ref 12–46)
Lymphs Abs: 3.1 10*3/uL (ref 0.7–4.0)
Monocytes Absolute: 0.7 10*3/uL (ref 0.1–1.0)
Monocytes Relative: 7 % (ref 3–12)
Neutro Abs: 7.2 10*3/uL (ref 1.7–7.7)
Neutrophils Relative %: 64 % (ref 43–77)

## 2011-02-09 LAB — CBC
HCT: 43.9 % (ref 36.0–46.0)
MCHC: 33.6 g/dL (ref 30.0–36.0)
Platelets: 276 10*3/uL (ref 150–400)
RDW: 12.5 % (ref 11.5–15.5)

## 2011-02-09 LAB — URINE MICROSCOPIC-ADD ON

## 2011-02-09 LAB — URINALYSIS, ROUTINE W REFLEX MICROSCOPIC
Glucose, UA: NEGATIVE mg/dL
Specific Gravity, Urine: 1.025 (ref 1.005–1.030)
pH: 6 (ref 5.0–8.0)

## 2011-02-09 LAB — COMPREHENSIVE METABOLIC PANEL
Albumin: 4 g/dL (ref 3.5–5.2)
Alkaline Phosphatase: 74 U/L (ref 39–117)
BUN: 13 mg/dL (ref 6–23)
Calcium: 10.9 mg/dL — ABNORMAL HIGH (ref 8.4–10.5)
Potassium: 3.8 mEq/L (ref 3.5–5.1)
Total Protein: 7.5 g/dL (ref 6.0–8.3)

## 2011-02-09 LAB — URINE CULTURE: Colony Count: 15000

## 2011-03-22 NOTE — Consult Note (Signed)
NAME:  Danielle [name], ROTHER NO.:  [id number]   MEDICAL RECORD NO.:  [id number]          PATIENT TYPE:  INP   LOCATION:  4703                         FACILITY:  [facility]   PHYSICIAN:  Pricilla Riffle, MD, FACCDATE OF BIRTH:  [birth year]   DATE OF CONSULTATION:  03/30/2007  DATE OF DISCHARGE:                                 CONSULTATION   IDENTIFICATION:  The patient is a 55 year old woman who we are consulted  on regarding chest pain.   HISTORY OF PRESENT ILLNESS:  The patient has no known history of  coronary artery disease.  She has a history of chest pain in the past  she was given nitroglycerin for in the fall. She was in [place],  [place] and she had an episode of chest pain she was hospitalized  for. She underwent a Myoview scan it sounds like (adenosine). By her  report, there was some liver and bowel underlying the heart that made  the underside of the heart hard to see, the anterior surface though  appeared to be normal.  The pain went away.  She actually was put on  Prilosec at the time. She did not have any recurrence until this past  Sunday. Then she noted retrosternal sharp bee stinging-like pain in the  left parasternal area radiating all the way through to the back. It  lasted a couple of seconds.  It went away spontaneously. On Tuesday, she  had mid sternal heaviness pressure like somebody was putting there heel  on her chest. Again then accompanied by left triceps lasting again a few  seconds and decreasing spontaneously.  Both spells were not associated  with any particular activity.  She was actually not doing anything at  the time.  She says that with activity she has not had any of these  spells, but she has been more fatigued to lately.  Also accompanied with  some nausea, she has had posterior headaches over the past few days and  she notes a 20-pound weight gain with lower extremity and upper  extremity edema that began earlier in the week  and has now gone away.   She went to Dr. Cyndia Skeeters office this morning and explained the symptoms,  her blood pressure was 154/105. She was admitted for further evaluation.   ALLERGIES:  CONTRAST allergy and CODEINE.   MEDICATIONS PRIOR TO ADMISSION:  1. Aspirin 81 mg daily.  2. Ibuprofen 200 q.i.d. p.r.n.  3. Levoxyl 175 daily.  4. Nortriptyline 25 t.i.d.  5. Prilosec 20 daily.   Here in the hospital:  1. Aspirin 81 mg a day.  2. Lopressor 25 b.i.d.  3. Protonix 40 daily.  4. Levoxyl 175 daily.  5. Nortriptyline 25 t.i.d.   PAST MEDICAL HISTORY:  1. Chest pain.  2. Hypertension.  3. History of glucose intolerance by report.  4. Obesity.  5. History of multiple back surgeries, 10.  6. Status post TAH/BSO secondary to uterine bleeding and      dysmenorrhea.  7. Remote tobacco, quit 6 months ago.  8. Reported hiatal hernia though patient  denies reflux.  9. Headaches.  10.Hypothyroidism.  11.Degenerative joint disease.   SOCIAL HISTORY:  The patient lives in [place] alone. She has a 45  pack-year history of smoking, quit 6 months ago.  She did though smoke 2  cigarettes on Sunday using nicotine patch but did not have the two on at  the same time.   FAMILY HISTORY:  Mother is alive and well at age 57.  Father died at age  53, had a history of CAD that began in his early 77s. One brother with  CAD diagnosed at 12.  He is a drinker.   REVIEW OF SYSTEMS:  All systems are reviewed. The patient notes fatigue,  chest pressure, shortness of breath as noted, cough that is  nonproductive, weakness, nausea, no change in bowel movements, no blood  in stool.  No history of ulcers.  Otherwise all systems negative to the  above problem except as noted.   PHYSICAL EXAM:  GENERAL:  The patient currently is without complaints of  chest pain.  No shortness of breath.  VITAL SIGNS:  Blood pressure is 139/96, pulse of 88, temperature is  98.7, O2 sat on room air is 96%, weight is 87.1  kg.  HEENT:  Normocephalic, atraumatic.  PERRL.  EOMI.  Sclera clear.  Nares  clear.  Throat clear.  NECK:  JVP is normal.  No bruits.  No thyromegaly.  No masses.  LUNGS:  Clear without rales or wheezes.  CARDIAC:  Regular rate and rhythm, S1, S2.  No S3, S4 or murmurs noted.  PMI is not displaced.  CHEST:  Nontender to palpation.  ABDOMEN:  Supple, nontender.  No hepatosplenomegaly.  No masses.  Normal  bowel sounds.  EXTREMITIES:  2+ distal pulses throughout.  No lower extremity edema,  tattoos on both lower extremities.  No bruits of the femoral region.  MUSCULOSKELETAL:  Patient moving all limbs, did not test further.  NEUROLOGIC:  The patient is alert and oriented x3.  Cranial nerves II-  XII are grossly intact.  Moving all extremities.   Chest x-ray is pending.   12-lead EKG, normal sinus rhythm, 84 beats per minute.  Nonspecific T-  wave ST changes.   LABORATORY DATA:  Labs significant for a hemoglobin of 14.1, BUN and  creatinine of 13 and 0.5, potassium of 3.6.  Initial troponin 0.02.  CK  86 with an MB of 1.2.  Calcium slightly elevated at 11.  Albumin 4.1.   IMPRESSION:  The patient is a 55 year old woman with a family history of  CAD, also a history of tobacco use, quit 6 months ago though smoking on  and off it sounds like. She has a history of chest pain.  She has had  two different types of pain, one is like bee stings, the other one is a  was a pressure sensation, both lasting seconds, both occur without any  physical activity, she notes fatigue otherwise. She notes lower  extremity edema and upper extremity edema which is now gone, not sure of  the etiology of this or how severe.  Blood pressure on evaluation today  was elevated and remains elevated, though less severe.   I am not sure what the episodes of chest pain are. Again, they are quite  atypical for coronary ischemia.  I would cycle cardiac enzymes as you are doing, it is reasonable treat with aspirin  and Lopressor.  I would  also increase her Protonix to b.i.d. dosing.   If  her enzymes are negative, again I would repeat a Myoview. We do not  have the records from the outside hospital __________  stress Myoview.  If positive proceed with cardiac catheterization.  We will continue to  follow.   ADDENDUM:  Will need a follow-up on chest x-ray.      Pricilla Riffle, MD, [facility]  Electronically Signed     PVR/MEDQ  D:  03/30/2007  T:  03/31/2007  Job:  [id number]

## 2011-03-22 NOTE — Cardiovascular Report (Signed)
NAME:  Danielle [name], Danielle [name]                  ACCOUNT NO.:  [id number]   MEDICAL RECORD NO.:  [id number]          PATIENT TYPE:  OIB   LOCATION:  1961                         FACILITY:  [facility]   PHYSICIAN:  Peter C. Eden Emms, MD, FACCDATE OF BIRTH:  29-Apr-1956   DATE OF PROCEDURE:  DATE OF DISCHARGE:                            CARDIAC CATHETERIZATION   A 55 year old patient of Dr. Bluford Kaufmann and myself with recurrent chest  pain despite a normal Myoview a year ago.   Cine catheterization was done 4-French catheters from right femoral  artery.   Left main coronary was normal.   Left anterior descending artery was normal.   First and second diagonal branches were normal.   The patient had a large left dominant circumflex coronary artery.  It  was normal.   Right coronary artery was small and nondominant.  It was normal.   RAO ventriculography was normal with EF of 60%.  There is no gradient  across the aortic valve.  No MR.  The ascending aortic root appeared  normal.   LV pressure was 140/90.  Aortic pressure was 145/90.   IMPRESSION:  The patient has no significant coronary artery disease.  She tolerated the procedure well.  Chest pain would appear to be  noncardiac in etiology.      Noralyn Pick. Eden Emms, MD, [facility]  Electronically Signed     PCN/MEDQ  D:  05/15/2008  T:  05/15/2008  Job:  [id number]

## 2011-03-22 NOTE — H&P (Signed)
NAME:  Danielle [name], Danielle [name] NO.:  [id number]   MEDICAL RECORD NO.:  [id number]          PATIENT TYPE:  INP   LOCATION:  4703                         FACILITY:  [facility]   PHYSICIAN:  Alanda Amass, M.D.   DATE OF BIRTH:  09-Dec-1955   DATE OF ADMISSION:  03/30/2007  DATE OF DISCHARGE:                              HISTORY & PHYSICAL   CHIEF COMPLAINT:  Chest pain.   PRIMARY CARE PHYSICIAN:  Dr. Leveda Anna.   HISTORY OF PRESENT ILLNESS:  Patient is a 55 year old Caucasian female  with a history of glucose intolerance, tobacco abuse and obesity,  admitted here from clinic because of new onset chest pain.  The patient  began having swelling in her lower and upper extremities a week ago  Friday, progressively worse and has since gone down.  She gained a total  of 12 pounds during that time, all of which she has now lost.  Also, on  the same day started having a little bit of chest pain.  She has had the  chest pain off and on all week.  It stays for approximately 3-5 minutes  at a time and is associated with nausea, shortness of breath,  diaphoresis.  She is also having shortness of breath on exertion all the  time, even when she is not having the chest pain, this is new for her.  As prior to this week, she was walking 1 mile a day without shortness of  breath.  Yesterday, she had an episode of extremely intense chest pain,  which also went into her left arm as well.  Most of her chest pain  episodes feel like someone is stepping on top of her chest, putting  pressure substernally.  She feels like it penetrates through to her  back, but does not radiate up to her shoulders or her chest wall.  The  chest pain comes at rest, as well as during exertion.  Also, she has  been feeling lightheaded for most of the week, sometimes at rest and  sometimes when she stands up.   REVIEW OF SYSTEMS:  Has had a cough most of the week, feeling hot/cold  at times.  No dysuria.  No history of  reflux.  Most recently, has been  tired with decreased energy.   CURRENT ALLERGIES:  1. CODEINE CAUSES NAUSEA AND HIVES.  2. CONTRAST DYE CAUSES SIGNIFICANT FACIAL EDEMA AND RESPIRATORY      COMPROMISE.   PAST MEDICAL HISTORY:  1. Elevated blood pressures beginning this week.  They were normal      before this week.  2. Glucose intolerance.  Hemoglobin A1c of 6.4 in March of 2008.  3. History of tobacco abuse, 45 pack year history.  4. History of hiatal hernia, age 72.  5. Hypothyroidism.  Last cholesterol was found to be LDL of 110 in      January of 2008.   OTHER HOSPITALIZATION:  Patient was also hospitalized in October of 2007  and had some kind of radio nucleotide stress test in [place] due to  chest pain.  They  saw that the top part of her heart was normal, but  could not see the bottom part of her heart, this is what the patient  says.   MEDICATIONS:  Include:  1. Aspirin 81 mg daily.  2. Ibuprofen 200 mg q.i.d. as needed for pain.  She only takes these      every now and then.  3. Levoxyl 175 mcg daily.  4. Nortriptyline 25 mg 3 times a day.  She was taking this for foot      and ankle pain and back pain, but because it has not helped she      stopped approximately 1 week ago.  5. Omeprazole magnesium enteric-coated delayed release 20 mg daily.   FAMILY HISTORY:  Significant for father and brother both having heart  attacks or MIs, hypertension, diabetes and CHF.  Also history of CVA in  the family.   SOCIAL HISTORY:  Patient lives at home by herself.  Her husband died in  04-11-05 of an MI.  She has 2 daughters and 3 grandchildren.  She is a  retired Photographer.  She used to work with  the elderly and the handicap.  She exercises by walking 1 mile per day  until recently.  No tobacco now, but is a past smoker, 45 year pack  history.  Though, she did admit to taking a few cigarettes this week  because the chest pain made her very  anxious.  Otherwise, she has not  had cigarettes for about a year now.  No drugs.   PHYSICAL EXAMINATION:  VITAL SIGNS:  Temperature 98.7.  Pulse 88.  Respiratory rate 20.  Saturating 96% on room air.  Blood pressure  139/94.  GENERAL:  Patient is well-developed, well-nourished in no acute  distress.  Alert, appropriate and cooperative throughout examination.  HEENT:  Eyes nonicteric.  Extraocular muscles intact.  Mouth:  Oropharynx without erythema.  NECK:  No cervical  lymphadenopathy.  No thyromegaly.  CHEST WALL:  No pain on palpation.  LUNGS:  Very slight crackles on inspiration in middle of posterior right  lung.  HEART:  Regular rate and rhythm.  No murmurs, rubs or gallops.  ABDOMEN:  Soft, nondistended, nontender.  No hepatosplenomegaly.  Positive, normoactive bowel sounds.  PULSES:  Two plus pedal pulses.  EXTREMITIES:  No edema on extremities.  NEUROLOGIC:  No cranial nerve defects noted.  PSYCH:  Alert and oriented x3.  Normally interactive and not appearing  depressed.   LABORATORY DATA:  Cardiac enzymes negative x1 with a CK of 86, CK-MB of  1.2, troponin 0.02.  CMET:  Sodium 136, potassium 3.6, chloride 103,  bicarb 25, BUN 13, creatinine 0.53, glucose 99, total bili 0.9, ALP 89,  AST 27, ALT very slightly elevated at 36, total protein 7.4, albumin  4.1, calcium elevated at 11.0.  White blood cell count 10.1, hemoglobin  14.1, platelets 310, hematocrit is 42.3.   ASSESSMENT/PLAN:  Patient is a 55 year old with a family history  positive for coronary artery disease, who also has glucose intolerance,  history of tobacco use, obesity and increased blood pressure who is here  for chest pain and to rule out myocardial infarction.   1. Chest pain.  We are ruling out for an myocardial infarction with      cardiac enzymes and EKG and now in the a.m., EKG just showed T      wave, nonspecific changes in V4-V6.  We will obtain another in the  morning.  Cardiology was  consulted and has decided to do a Myoview      in the morning.  If positive, we will go to catheterization, but      they believe this might not be due to cardiac issues.  They also      advised [place] to start her back on Protonix 2 times a day.  She is      currently on metoprolol 25 mg 2 times a day due to the possibility      of acute coronary issues, as well as to treat her blood pressure.      Also, we are checking an echocardiogram, especially in light of her      recent edema and weight gain.  She is on aspirin 81 mg daily.  2. Abnormal glucose/glucose intolerance.  We are checking patient's      CBGs before meals and every evening and if necessary we will add      sliding scale insulin.  3. Hypothyroidism.  We will continue patient on her Levoxyl and check      a TSH.  4. Elevated blood pressure.  Patient may have a new diagnosis of      hypertension.  It is unusual that she was not hypertensive before      now.  She is on metoprolol 25 mg 2 times a day.  Since considering      possible coronary      process, we will monitor.  5. Prophylaxis.  Early ambulation and Protonix.  6. Disposition.  Pending rule out myocardial infarction and Myoview      results.           ______________________________  Alanda Amass, M.D.     JH/MEDQ  D:  03/30/2007  T:  03/31/2007  Job:  [id number]

## 2011-03-22 NOTE — Assessment & Plan Note (Signed)
[facility]                            CARDIOLOGY OFFICE NOTE   Danielle, [name]                         MRN:          [id number]  DATE:12/11/2008                            DOB:          Nov 22, 1955    Danielle [name] returns today for followup.  She has had previous chest pain.  She  continues to get some atypical symptoms.  They are nonexertional.  She  had a normal heart cath.  In July 2009, her LV function was normal.   Coronary risk factors include hypercholesterolemia.  She is on Zocor.  She has not had LFTs in a year.  We will try to do those today.   She gets occasional palpitations which seemed to be improved.  She is on  fairly high dose beta-blocker.  I am somewhat surprised that her heart  rate is still high with this, but she says she has been taking a 100  twice a day.  She has had normal metanephrine.  The patient will have a  follow up TSH and T4 today, since she still has relatively high heart  rates on such a high dose of beta blockers.  Otherwise, she is doing  well.  She has some lower extremity edema which is improved on  hydrochlorothiazide.   The patient is allergic to CODEINE and IVP DYE.  She actually had some  throat swelling and shortness of breath during one of her IVPs for  kidney stones.   CURRENT MEDICATIONS:  1. Aspirin.  2. Levoxyl 175 mcg a day.  3. Metoprolol 100 b.i.d.  4. Omeprazole 40 a day.  5. Hydrochlorothiazide 12.5 a day.  6. Zocor 40 a day.   The patient is otherwise doing well.  She has been a widower for 3  years.  She has 2 older daughters.  She just got a new puppy named  Bella, which helps fill the void.  She helps to take care of her mother-  in-law which takes up good part of her day.   PHYSICAL EXAMINATION:  GENERAL:  Remarkable for jovial affect.  She  appears her stated age.  VITAL SIGNS:  Weight is 188.  Blood pressure is 115/80, pulse 90 and  regular, respiratory rate 14, and afebrile.  HEENT:   Unremarkable.  NECK:  Carotids are normal without bruit.  No lymphadenopathy,  thyromegaly, or JVP elevation.  LUNGS:  Clear.  Good diaphragmatic motion.  No wheezing.  CARDIOVASCULAR:  S1 and S2.  Normal heart sounds.  PMI normal.  ABDOMEN:  Benign.  Bowel sounds positive.  No AAA.  No tenderness.  No  bruit.  No hepatosplenomegaly.  No hepatojugular reflux.  EXTREMITIES:  Distal pulses are intact.  No edema.  NEUROLOGIC:  Nonfocal.  SKIN:  Warm and dry.  MUSCULOSKELETAL:  No muscular weakness.  BACK:  She has had multiple back surgeries by Dr. Otelia Sergeant.   IMPRESSION:  1. Chest pain, atypical.  Normal catheterization in July.  Continue      baby aspirin.  2. Relative tachycardia with palpitations, improved on beta-blocker  therapy.  Continue metoprolol b.i.d.  3. Hypercholesterolemia.  Continue Zocor.  We will check her LFTs      today to rule out statin toxicity.  4. Lower extremity edema, improved.  Low-sodium diet and low-dose      diuretic.  5. Hypothyroidism.  Continue Levoxyl replacement.  TSH and T4 to be      checked today.  6. History of reflux.  Continue omeprazole.  Avoid late-night meals      and recumbency on a full stomach.   Overall, I think Danielle [name]'s heart is stable.  I will see her back in 6  months.  She will have her blood work today.     Noralyn Pick. Eden Emms, MD, [facility]  Electronically Signed    PCN/MedQ  DD: 12/11/2008  DT: 12/11/2008  Job #: [id number]

## 2011-03-22 NOTE — Assessment & Plan Note (Signed)
[facility]                            CARDIOLOGY OFFICE NOTE   Danielle, [name]                         MRN:          [id number]  DATE:06/19/2008                            DOB:          Nov 03, 1956    Danielle [name] returns today for followup.  She has had atypical chest pain  with a normal Myoview.  Her cath showed normal coronary arteries.  However, she continues to have occasional episodes of pain and nitro  helps with.  I do not know whether this could be esophageal spasm.  Clinically, I doubt that it is coronary spasm.  However, I told her  there is no harm in taking the nitroglycerin if it helps.  If she has  multiple episodes in a month, I may switch her to long-acting Imdur.   In the future, she may need esophageal monitoring or possibly EKG  monitoring to see if there is transient ST-segment elevation.  For the  time being, we will be conservative and see how she does.  Her  echocardiogram was also normal.   REVIEW OF SYSTEMS:  Otherwise negative in particular she has not had  diaphoresis, shortness of breath or syncope.   CURRENT MEDICATIONS:  1. Aspirin.  2. Levoxyl 175 a day.  3. Metoprolol 100 b.i.d.  4. Omeprazole 40 a day.  5. Hydrochlorothiazide 12.5 a day.  6. Zocor 40 a day.   PHYSICAL EXAMINATION:  GENERAL:  Remarkable for healthy-appearing,  middle-aged white female, in no distress.  VITAL SIGNS:  Blood pressure is 120/70, pulse 80 and regular,  respiratory rate 14, afebrile.  Weight is 187.  HEENT:  Unremarkable.  NECK:  Carotids are normal without bruit.  No lymphadenopathy,  thyromegaly, or JVP elevation.  LUNGS:  Clear.  Good diaphragmatic motion.  No wheezing.  HEART:  S1 and S2.  Normal heart sounds.  PMI normal.  ABDOMEN:  Benign.  Bowel sounds positive.  No AAA.  No tenderness.  No  hepatosplenomegaly.  No hepatojugular reflux.  EXTREMITIES:  Distal pulses are  intact.  No edema.  NEURO:  Nonfocal.  SKIN:  Warm  and dry.  MUSCULOSKELETAL:  No muscular weakness.   I reviewed her urine, the results of metanephrine and dopamine and  epinephrine levels were normal.   Her LV function was normal on heart cath with an EF of 60%.   Echocardiogram showed good LV function without significant valve  disease.   IMPRESSION:  1. Chest pain.  Normal Myoview, normal cath, doubt spasm.  Continue to      observe.  Continue baby aspirin a day, p.r.n. nitroglycerin.  2. Relative tachycardia.  No obvious indication for it.  Thyroid      status is normal.  Metanephrine is negative.  Continue low-dose      beta-blocker.  3. Hypercholesterolemia.  Continue current dose of Zocor.  Lipid and      liver profile in 6 months.  4. Hypothyroidism.  Continue Levoxyl 175 mcg a day.  5. History of reflux.  Continue omeprazole.  Consider possibility of  esophageal spasm and need for manometry if symptoms continue.     Noralyn Pick. Eden Emms, MD, [facility]  Electronically Signed    PCN/MedQ  DD: 06/19/2008  DT: 06/20/2008  Job #: [id number]

## 2011-03-22 NOTE — Assessment & Plan Note (Signed)
[facility]                            CARDIOLOGY OFFICE NOTE   NAME:Danielle [name], Danielle [name]                         MRN:          [id number]  DATE:05/05/2008                            DOB:          April 23, 1956    Referred for chest pain.   This is a 55 year old patient with longstanding atypical chest pain for  over 2 years.  The pain usually starts with a headache.  She then gets  an elevated blood pressure.  She has some radiation to the back and  associated diaphoresis.  The patient subsequently takes a nitroglycerin  and she feels better.  Frequently, she will go to sleep.  She tells me  she has been seen by neurologist and worked up for migraines.   She has not had a 24-hour urine.  She had a Myoview study in May 2008,  which was normal except for some mild inferior wall hypokinesis.  She  apparently has had a normal echo as well.  She was hospitalized last  year for question of a TIA.  She had a carotid duplex that showed mild  plaque, but no fixed stenosis.  She has not had a hypercoagulable panel.   The patient is concerned about her continued spells of chest pain that  relieve with nitro.  I told her that the only way to make sure that she  is not having angina is to do a heart cath.  She is willing to proceed.  However, she has had bad IVP dye allergy before, she will need to be  premedicated.  She said she has had contrast with premedication and does  fine.   She is a smoker.  She only smokes one to two cigarettes a day now.  We  counseled her for less than 10 minutes about this, she needs to stop  totally.  There is a chance that some of these episodes represent spasm  in which case it may be worthwhile to stop her beta blocker and treat  her with long-acting nitrates and calcium blockers.  I will see what her  coronary angiogram looks like before making these recommendations.  Also, in regard to her fluctuation in blood pressure, I think she  needs  a 24-hour urine for metanephrine analysis.   Since the patient appears to be having chest pain and a question of her  previous TIA, I also questioned her about any previous heme workup in  regards to hypercoagulability, which I think needs to be done.   REVIEW OF SYSTEMS:  Otherwise, remarkable for her headaches, these have  also been going on for about 2 years.   Otherwise negative.   She is on aspirin a day, Levoxyl 175 mcg a day, metoprolol,  nortriptyline 50 mg t.i.d., omeprazole 40 mg a day, hydrochlorothiazide  12.5 mg a day, and Zocor 40 a day.   She is allergic to IVP DYE and CODEINE.   She has had previous UTIs, previous hematuria unspecified, hypertension,  hypercholesterolemia, previous palpitations now resolved, smoking,  history of pyelonephritis, and kidney stones, history of hypothyroidism,  cervical spine disorder, carpal tunnel syndrome, and back pain.   The patient is widowed with two children.  She does not work.  She  claims to be disabled due to back problems.  She has had multiple  previous back surgeries.  She smokes a few cigarettes a day and does not  drink.   Family history is remarkable for premature coronary disease on both  sides of the family.   PHYSICAL EXAMINATION:  Remarkable for an overweight white female in no  distress.  Weight is 190, blood pressure 129/80, pulse 90 and regular,  respiratory rate 14, and afebrile.  HEENT:  Unremarkable.  Carotids normal without bruit.  No  lymphadenopathy, thyromegaly, or JVP elevation.  LUNGS:  Clear.  Good diaphragmatic motion.  No wheezing.  HEART:  S1 and S2 with normal heart sounds.  PMI normal.  ABDOMEN:  Benign.  Bowel sounds positive.  No AAA.  No bruit.  No  tenderness.  No hepatosplenomegaly.  No hepatojugular reflux.  EXTREMITIES:  Distal pulses are intact.  No edema.  NEUROLOGIC:  Nonfocal.  SKIN:  Warm and dry.  No muscular weakness.   EKG shows sinus rhythm with nonspecific ST-T  wave changes.   IMPRESSION:  1. Continued chest pain syndrome despite normal Myoview last year,      symptoms clearly relieved with nitro.  The patient is a smoker with      hypertension and hypercholesterolemia as well as family history.      We will proceed with diagnostic heart catheterization.  IVP dye      allergy necessitates premedication.  2. Hypertension with variable fluctuation in pressure during symptoms.      She will have 24-hour urine for metanephrines.  We will check renal      arteries at the time of cath.  3. Hypercholesteremia.  Continue Zocor.  Lipid and liver profile in 6      months.  4. Question of migrainous equivalent with headaches.  Follow up      primary care MD in Neurology.  5. Chronic back problems.  As needed anti-inflammatories.  Follow up      with orthopedic doctors.  6. History of reflux.  Continue omeprazole.  This will be part of her      premedication including Benadryl and prednisone.  7. Hypothyroidism.  TSH and T4 in 6 months.  Continue Levoxyl.   Further recommendation will be based on results of her heart cath,  hypercoagulability workup, and 24-urine for metanephrine.     Noralyn Pick. Eden Emms, MD, [facility]  Electronically Signed    PCN/MedQ  DD: 05/05/2008  DT: 05/06/2008  Job #: [id number]   cc:   Kerrin Champagne, M.D.

## 2011-03-22 NOTE — Discharge Summary (Signed)
Danielle [name], Danielle [name]                  ACCOUNT NO.:  [id number]   MEDICAL RECORD NO.:  [id number]          PATIENT TYPE:  INP   LOCATION:  4703                         FACILITY:  [facility]   PHYSICIAN:  Rolm Gala, M.D.    DATE OF BIRTH:  1955-11-24   DATE OF ADMISSION:  03/30/2007  DATE OF DISCHARGE:                               DISCHARGE SUMMARY   DISCHARGE DIAGNOSES:  1. Atypical chest pain with negative Myoview.  2. Hypothyroidism.  3. Tobacco abuse.  4. Obesity.  5. Glucose intolerance.  6. History of hiatal hernia.  7. Gastroesophageal reflux.   PROCEDURES:  Myoview on Mar 31, 2007 showed an EF of 68%, no ischemia.  The patient was seen Dr. Gala Romney.   CONSULTATIONS:  Dr. Gala Romney with Cardiology.   BRIEF HISTORY:  The patient is a 55 year old Caucasian female with a  history of glucose intolerance, tobacco abuse, obesity and new onset  hypertension, who came to the clinic for atypical chest pain and had  swelling in her extremities.  Chest pain was associated with nausea,  shortness of breath and diaphoresis.  Pain is a substernal pressure,  comes on at rest, as well as during exertion.   HOSPITAL COURSE:  1. Atypical chest pain:  Dr. Gala Romney with cardiology was consulted      and did a Myoview, which was negative for ischemia.  The patient      does have a strong family history and multiple risk factors,      however.  Her last LDL was 108 as an outpatient in January.  The      patient likely needs to be started on a Statin as an outpatient.      Her cardiac enzymes were negative x3.  An EKG showed no acute      changes.  A TSH was normal.  Chest pain should revolve in the next      day.  The patient's primary doctor may consider an outpatient echo.  2. Hypertension:  The patient was started on metoprolol 25 p.o. b.i.d.  3. Glucose intolerance:  Fasting CBGs were normal.  The patient did      not need sliding scale insulin during her hospital stay.  4.  Hypothyroidism:  The patient was continued on her home Levoxyl.      Her TSH was normal.  5. GERD:  Cardiology suggest that we increase the patient's Prilosec      to b.i.d., and the patient was discharged on this increased      medication.   CONDITION ON DISCHARGE:  Improved and stable.   LABORATORY EVALUATION:  Cardiac enzymes were negative x3.  TSH was  4.010.  A fasting BMET on May 24 was normal with a fasting glucose of  106.  Calcium of 10.8.  CBC showed white blood cells 9.1, H&H 13.6 and  40.5, platelets 289.  UA showed trace ketones, but was otherwise  negative.  A CMP on April 23 showed a slightly elevated ALT at 36, a  normal AST, normal alk phos, normal bilirubin.   FOLLOWUP:  The  patient was told to follow up with Dr. Leveda Anna in 1 to 2  weeks.  Told to bring all her medications to her appointment.      Rolm Gala, M.D.     Bennetta Laos  D:  03/31/2007  T:  03/31/2007  Job:  [id number]

## 2011-03-25 NOTE — H&P (Addendum)
   NAME:  Danielle [name], Danielle [name]                            ACCOUNT NO.:  [id number]   MEDICAL RECORD NO.:  [id number]                   PATIENT TYPE:  INP   LOCATION:  NA                                   FACILITY:  [facility]   PHYSICIAN:  Malachi Pro. Ambrose Mantle, M.D.              DATE OF BIRTH:  1955/12/07   DATE OF ADMISSION:  03/19/2003  DATE OF DISCHARGE:                                HISTORY & PHYSICAL   ____ QA MARKER: 20 ____.  STA  report.____ QA MARKER: 243 ____.                                                    Malachi Pro. Ambrose Mantle, M.D.    TFH/MEDQ  D:  03/18/2003  T:  03/18/2003  Job:  [id number]

## 2011-03-25 NOTE — H&P (Signed)
[facility]  Patient:    Danielle [name], Danielle [name]                         MRN: [id number] Adm. Date:  [id number] Attending:  Thyra Breed CC:         Kerrin Champagne, M.D.                         History and Physical  Ms. Stansbery comes in for followup evaluation of her chronic low back pain syndrome with radiation out to the right lower extremity predominantly.  She initially presented with pain to be evaluated in the upper lumbar regions but most of her pain emanates from the lumbosacral region and out to the lateral aspect of the right lower extremity to the lateral leg and to the great toe. She has had nerve conduction studies which were interpreted as normal.  She has undergone extensive repeated injections into her back with no sustained improvements.  She describes her pain as a sharp discomfort made worse by activities and improved by laying down with her legs flexed.  She has found that the Zanaflex is helpful and she is up to two tablets in the morning and two tablets in the evening of 2 mg.  She is having minimal side effects to this.  In addition she takes Advil and Synthroid.  PHYSICAL EXAMINATION  VITAL SIGNS:  The patient is afebrile with vital signs stable.  Please see flow sheet for details.  BACK:  Tenderness over the lumbosacral regions to palpation which are increased by extension.  EXTREMITIES:  Straight leg raise signs today were negative.  Deep tendon reflexes were symmetric in the upper extremities, 1+ at the knees and ankles.  IMPRESSION: 1. 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 2. Hypothyroidism per primary care physician. 3. History of renal calculi.  DISPOSITION: 1. Continue with Zanaflex with increasing the doses as outlined previously. 2. Trial of  methadone 5 mg one p.o. q.8h. #90 with no refill.  She was advised    to let us know if she was intolerant of this. 3. Continue on the Synthroid and Advil per her primary care physician. 4. Follow-up with me in four weeks. 5. I will hold off on further injections for the time being. DD:  02/07/01 TD:  02/07/01 Job: [id number]

## 2011-03-25 NOTE — Op Note (Signed)
NAME:  Danielle [name], Danielle [name]                            ACCOUNT NO.:  [id number]   MEDICAL RECORD NO.:  [id number]                   PATIENT TYPE:  INP   LOCATION:  0455                                 FACILITY:  [facility]   PHYSICIAN:  Malachi Pro. Ambrose Mantle, M.D.              DATE OF BIRTH:  06-Jul-1956   DATE OF PROCEDURE:  03/19/2003  DATE OF DISCHARGE:                                 OPERATIVE REPORT   PREOPERATIVE DIAGNOSES:  1. Persistent abnormal uterine bleeding.  2. Menorrhagia.  3. Dysmenorrhea.  4. History of complex left ovarian cyst.  5. History of pelvic endometriosis.   POSTOPERATIVE DIAGNOSES:  1. Persistent abnormal uterine bleeding.  2. Menorrhagia.  3. Dysmenorrhea.  4. History of complex left ovarian cyst.  5. History of pelvic endometriosis.  6. Frozen section of the left ovary benign follicular cyst.   OPERATION:  Abdominal hysterectomy, bilateral salpingo-oophorectomy.   SURGEON:  Malachi Pro. Ambrose Mantle, M.D.   ASSISTANT:  Zenaida Niece, M.D.   ANESTHESIA:  General.   DESCRIPTION OF PROCEDURE:  The patient was brought to the operating room and  placed under satisfactory general anesthesia. She was placed in frog leg  position. The abdomen, vulva, vagina, and urethra were prepped with Betadine  solution, Foley catheter was inserted to straight drain and the abdomen was  then draped as a sterile field. A transverse incision was made and carried  in layers through the skin, subcutaneous tissue and fascia. The fascia was  extended transversely. The fascia was then separated from the rectus muscle  superiorly and inferiorly. The rectus muscle was split in the midline,  peritoneum opened vertically. Exploration of the upper abdomen revealed the  liver to be smooth, the gallbladder was smooth and cystic without stones,  both kidneys felt normal. Exploration of the pelvis revealed that the left  ovarian cyst had diminished in size, there was evidence of prior tubal  ligation, the right ovary was normal. I did not see the endometriosis of the  posterior cul-de-sac. The uterus was anterior, probably 1 1/2 times normal  size, was quite firm and had a couple of nodules on the surface that were  either fibroids or adenomyomas. The uterus also appeared to be slightly  brownish in hue. Packs and retractors were used for exposure. The uterus was  elevated into the operative field, both round ligaments were divided with  electrical current, bladder flap was developed, infundibulopelvic ligaments  were skeletonized somewhat and then divided between clamps and doubly suture  ligated. The uterine vessels were clamped, cut and suture ligated after the  left ovary and tube were removed and sent for frozen section. Parametrial  and paracervical tissues and uterosacral ligaments were clamped, cut and  suture ligated and the uterosacral ligaments were held. The right vaginal  angle was entered and then the uterus was removed by transecting the upper  vagina, vaginal angled suture  on the left was placed and the central portion  of the vagina is closed with interrupted figure-of-eight sutures of #0  Vicryl. Liberal irrigation confirmed that there was still a little bit of  blood present. I secured complete hemostasis, sutured the uterosacral  ligaments together in the midline, reperitonealized over the vaginal cuff  and removed all packs and retractors and closed the abdominal wall with a  running suture of #0 Vicryl on the peritoneum, interrupted #0 Vicryl on the  rectus muscle, two running sutures of #0 Vicryl on the fascia, running 3-0  Vicryl on the subcu tissue and staples on the skin. Dr. Laureen Ochs had called  during the procedure and said that the left ovary was benign by frozen  section and probably a follicular cyst. Blood loss was about 100 mL, sponge  and needle counts were correct. The patient returned to recovery in  satisfactory condition. Urine output was low.  She had received 3000 mL and  was on her fourth liter but the total urine output had only been 100 mL so  we will watch that and I attribute to the fact that she has been n.p.o.  overnight.                                               Malachi Pro. Ambrose Mantle, M.D.    TFH/MEDQ  D:  03/19/2003  T:  03/19/2003  Job:  [id number]

## 2011-03-25 NOTE — Op Note (Signed)
[name], [name]                  ACCOUNT NO.:  [id number]   MEDICAL RECORD NO.:  [id number]          PATIENT TYPE:  INP   LOCATION:  2866                         FACILITY:  [facility]   PHYSICIAN:  Kerrin Champagne, M.D.   DATE OF BIRTH:  [birth year]   DATE OF PROCEDURE:  06/17/2005  DATE OF DISCHARGE:                                 OPERATIVE REPORT   PREOPERATIVE DIAGNOSIS:  Painful degenerative disc disease L2-L3 above an L3  to S1 fusion.   POSTOPERATIVE DIAGNOSIS:  Painful degenerative disc disease L2-L3 above an  L3 to S1 fusion.   PROCEDURE:  Removal of Osteonix Stryker rod and screws from the right L3-L4  level, central laminectomy at the L2-L3 level with right sided facetectomy  for a right L2-L3 transpedicular lumbar interbody fusion using a 9 mm DePuy  Leopard cage, lordotic type, with both local and Symphony bone graft.  Posterolateral fusion L2-L3 using local and Symphony bone graft material.  Instrumentation L2-L3 using Monarch pedicle screws and rods.  The patient  had interoperative pedicle screw monitoring and interoperative use of Cell  Saver.   SURGEON:  Kerrin Champagne, M.D.   ASSISTANT:  Wende Neighbors, P.A.-C.   ANESTHESIA:  GOT, Dr. Diamantina Monks.   SPECIMENS:  None.   ESTIMATED BLOOD LOSS:  250 mL.   COMPLICATIONS:  None.   DISPOSITION:  The patient returned to the PACU in satisfactory condition.   BRIEF HISTORY:  The patient is a 55 year old female who has undergone  multiple sequential lumbar fusion surgery beginning with L5-S1 in the early  1990s and proceeding on to ultimately an L3 to sacrum fusion.  Her last  surgery in 1997 with instrumentation using pedicle screws only able to be  used on a single side, right side, because of difficulty entering pedicles.  She had a previous Ray cage at this level and required posterior  instrumentation for stabilization of this level.  Over time, she has  developed increasing rotatory instability at the L2-L3  level above the L3 to  sacrum and fusion.  She has also developed subsidence of her Ray cages at  both L3-L4 and L4-L5 with development of some hyperlordosis over these  segments.  It is noted on radiographic studies that she has a very mild to  moderate lumbar spinal stenosis at L2-L3 above her L3 to sacrum fusion with  rotatory angulatory motion present indicating instability here along with  severe degenerative disc changes.  Her pain is primarily back and buttock  and worsened with any flexion moments.  She is noted to have mechanical back  pain.  Diskograms were positive for replication of pain.  She is brought to  the operating room to undergo removal of hardware at the L3-L4 level with an  extension of her fusion to the L2-L3 level using a 360 degree technique with  a right TLIF and posterolateral fusion with pedicle screw and rod  instrumentation.   OPERATIVE FINDINGS:  She was found to have increased disc degeneration at  the L2-L3 level with very little disc material remaining within  her disc  space.  She was found to have a solid fusion posterolateral at L3-L4 with  excellent bone mass present over these fusion areas.   DESCRIPTION OF PROCEDURE:  After adequate general anesthesia, the patient in  a prone position and chest rolls placed, the leg and thigh is brought into  full extension.  The patient underwent a standard prep with DuraPrep  solution for the mid dorsal region to the lower sacral level.  Preoperative  antibiotics with Ancef.  She was draped in the usual manner, Ioban Vi-Drape  was used.  All pressure points were well padded and care taken with  placement of the arms to prevent contraction injuries or compression areas.  A Foley catheter was placed prior to her being turned to a prone position.  Note that interoperative monitoring was placed over the legs during this  period of time.  She was wearing standard TED stockings interoperatively.  The Jean Rosenthal table was  used for C-arm fluoroscopy throughout the case.  The  patient underwent an incision ellipsing the old incision scar over the upper  level, this was carried out from L1 to the L3-L4 level.  Through the skin  and subcutaneous layers ellipsing the old incision scar down to the  lumbodorsal fascia and this was incised in the midline over both sides of  the spinous process of L2, L3, and L1 bilaterally.  Cobb was used to elevate  the paralumbar muscles off the posterior aspect of the elements extending  from L1 and carried out over the posterolateral elements at the previous  fusion site of L3-L4 on both sides.  The instrumentation immediately  identified on the right side at the L3-L4 level.  These carefully freed up  using Cobbs and then bare rongeur.  The Star screwdriver was then used to  loosen the caps to the fasteners and then the caps were removed along with  the rod and each of the two collars over the rod.  Each of the pedicle  screws then on the right side at L3 and L4 were removed using a handle  engaging the screw head fasteners and removing it appropriately.  These  particular screws did appear to have solid purchase within the transverse  process of the right side at L3 and on the right side and pedicle of L4.  Excellent bone fusion mass on the right side as well as the left side  posteriorly noted.   Attention was turned then to exposure of the transverse process on the right  side of L2 and on the left side of L2 and lateral mass fusion at the L3-L4  fusion area.  This was well obtained and then Viper retractors placed.  The  facet was resected at the L2-L3 level bilaterally.  Interoperative C-arm  fluoroscopy was used to correctly identify the levels with clamp over the  spinous process of L2, one over the spinous process of L1 identifying these  segments.  Following the identification, the packing of the areas in the posterior and posterolateral region over the transverse  process and lateral  masses, attention was turned then to performing a central laminectomy  resecting the spinous process of L2, carefully debriding any soft tissue  attachments, removing the inferior 1/3 of the spinous process of L1 then  thinning the posterior aspect of the lamina of L2 bilaterally.  An osteotome  was then used to resect the right inferior articular process of L2 before  resection of the area over  the pars, as well, and portions of the lamina.  A  complete central laminectomy was carried out preserving bone mass on the  left side within the pars region and over the left facet joint at L2-L3.  This then carried out, a 3 mm Kerrison was used to resect the remaining  portions of the central portion of the lamina at the L2 level and perform  decompression of the right L2 nerve root as it exited out the L2-L3 ring.  Supra-articular process of L3 was resected on the right side removing bone  and also identifying the intramedullary area of the pedicle on the right  side at the L3 level.  Following the decompression centrally and identifying  the location of the pedicle at the L2 and L3 level, attention was turned to  placement of pedicle screws, first on the right side at the L3 level, making  an opening then into the superior aspect of the pedicle through the supra-  articular process of L3.  First an awl was used to make an initial entry  point and then a straight pedicle finder was used to probe the pedicle using  the C-arm fluoroscopy to obtain correct position, alignment, and placement  of the pedicle finder within the central portion of the pedicle in the  lateral plane.  A ball tip probe was used to probe the opening made in the  pedicle and probed demonstrating patency of the cortex in all directions,  medial and lateral, superior and inferior.  This was then measured for depth  45 mm.  A screw was chosen, 6.25 width, and 5.5 tap was used to tap the  screw hole, then 45  6.25 screw was placed on the right side.  Note that  decortication was carried out over the lateral mass on the right side of the  L3 level.  Bone graft that had been harvested from the central laminectomy  and facetectomy on the right side at L2-L3 was then used in combination with  Symphony material, this was packed over the transverse process and  posterolateral fusion mass right side at L3.  At the L2 level, on the right  side, then an awl was used to make an initial entry point in the  intersection of the transverse process of the supra-articular process of L2  within the crotch of this area.  Then, the pedicle probe was then used to  probe the pedicle using C-arm fluoroscopy to ascertain the correct position  and alignment for this pedicle probe placing it within the central portions  of the pedicle on the lateral view.  Convergence was excellent at both these  segments.  Depth measuring 45 mm on the right side, a 5.5 screw was chosen. Tapping with a 4.75 tap, we then placed the 5.5 screw after decorticating  the transverse process on the right side at L2.  Also, ball tip probe was  used to probe the pedicle following the tapping and the performance of a  probe with the pedicle.  This demonstrated, again, patency of the pedicle  canal without evidence of penetration, lateral, medial, or superolateral.  Bone graft was placed over the transverse process prior to placement of the  screw on the right side at the L2 level.  On the left side, similarly then,  the screw was placed at the L3 level using a rongeur to make an entry point  into the supra-articular process of L3 then using a pedicle probe, probed  the  pedicle appropriately after using an awl for initial entry point, using  C-arm to direct this, ball tip probe used again to test the patency of the  pedicle ands noted to be without evidence of penetration.  A 45 by 6.25  screw was placed on the left side, after decortication of the  lateral mass  and placement of the bone graft.  There was both local and Symphony bone  graft.  The final screw on the left side at L2 was placed, again using an  awl for an entry point, resection of the transverse process and supra-  articular process, then using the pedicle finder to probe the pedicle, ball  tip probe to insure patency of the pedicle, and to insure no evidence of  broaching the cortex.  A depth of 45 mm chosen.  Only a 40 mm screw that was  available at a 5.5 width, so a 40 mm by 5.5 was chosen and placed on the  left side at this level.  Note that the transverse process was decorticated,  bone graft placed local, and Symphony over the left side at L2 and L3,  completing the posterolateral fusion areas.  After placement of the final  port screw, then each of the screws were tested on the right side, greater  than 50 on the right side, 60 at the L3 level, and greater than 50 on the L2  level.  On the left side, 30 at the L2 level and at the L3 level, 39.  Having completed this, then rods were then placed, curved 45 mm rods.  When  trying to place rods, it was noted that the screws at the L3 level were 6.25  that were meant to accept a smaller rod than 4.5 rods and these were  switched again for 45 mm lengths by 6.25 to accept a 5.5 rod.  The rods were  then easily placed and C-arm demonstrated the screws in good position and  alignment.  After placement of the rods, then free hand placement of the  caps onto the fasteners was performed.  Attention was turned then to the  TLIF on the right side at the L2-L3 level, the remaining portion of the  supra-articular process of L3 was resected on the right side so that the  posterior aspect of the disc on the right side at L2-L3 was able to be  carefully exposed.  Large epidural veins were cauterized using bipolar  electrocautery.  A 15 blade scalpel was used to incise the disc and  posterior annulus on the right side.  The disc  space was then dilated using 8 then 9 mm dilators, the 9 provided excellent fit under C-arm fluoroscopy.  With this, attention was turned to preparation of the disc space.  Pituitaries were used to remove any degenerated disc material, endplates  were then removed and decorticated, removal of cartilaginous endplates using  curettage, both irregular curets, upbiting, and down biting, as well as ring  curets and forward ring curets, pituitaries and a forward biting pituitary  were used to excise disc material from the intervertebral disc space.  The 9  mm lordotic trial was placed and this provided excellent appearance on the C-  arm fluoroscopy with good distraction of the disc space and restoration of  the height.  Lordotic cage was chosen in order to compensate for a loss of  lordosis over the lower two segments due to ring cage collapse and  subsidence.  This being  the case, attention was returned to the TLIF, disc  space distracted, the trial removed, bone graft in the form of local bone  graft was then placed within the intervertebral disc space and impacted  within the 9 mm lordotic Leopard cage.  The cage was then placed over the  posterior aspect of the disc space, distraction obtained between the screws  on the right side, and this was then impacted into place.  The cage was then  subsided beneath the posterior aspect of the disc space about 3-4 mm and  then rotated transversely using the kicker impactors.  Following this, C-arm  demonstrated the cage in an excellent position and alignment.  Compression  was then obtained between the screws on the right side at L2 and L3 and the  fasteners then attached to the rod and torqued to 80 foot pounds, first at  the L2 level and the at the L3 level with excess rod extending inferiorly  over fused segments.  On the left side, similarly, then compression was  obtained across the pedicle screws passing the rod through the upper  fastener first  and then to a lower fastener at 80 foot pounds.  This  completed instrumentation. Irrigation was perform3ed.  Permanent C-arm  images were obtained in the AP and lateral planes demonstrated excellent  position and alignment of the lordotic cage, pedicle, screw, and rod  construct.  Following further irrigation, exploration of the spinal canal  demonstrated wide patency of the neural foramen at the L2-L3 level both  sides.  Lateral recess of the L1-L2 level was felt to be well decompressed  and the capsule at the L1-L2 were well preserved, and the pedicle screw rod  construct felt to be well lateral to the L1-L2.  With this and careful  inspection of the canal demonstrating no bone graft posterior to the  discectomy site on the right side at L2-L3.  Irrigation was performed.  Additional bone graft was then placed over the posterolateral region on both  sides that was remaining.  The incision was then carefully sprayed with  platelet poor plasma obtaining further hemostasis.  A medium Hemovac drain was placed in the depth of the incision.  The paralumbar muscles were then  reapproximated over the laminectomy region loosely using interrupted #1  Vicryl sutures, with care taken to insure no suturing of the drain.  The  lumbodorsal fascia was reapproximated in the midline with interrupted #1  Vicryl sutures reattaching the spinous processes and supraspinous ligament  where possible.  The deep subcu layers were reapproximated with interrupted  #1 and 0 Vicryl sutures, the more superficial layers with interrupted 2-0  Vicryl sutures, and the skin was closed with running subcu stitch of 4-0  Vicryl.  Tincture of Benzoin and Steri-Strips applied.  The patient then had  4 by 4s and ABD pads affixed to the skin with Hypofix tape with charging of  the Hemovac drain.  She was then returned to a supine position, reactivated,  extubated, and returned to the recovery room in satisfactory condition.  All   instrument and sponge counts were correct.      Kerrin Champagne, M.D.  Electronically Signed     JEN/MEDQ  D:  06/17/2005  T:  06/17/2005  Job:  [id number]

## 2011-03-25 NOTE — Discharge Summary (Signed)
NAME:  Danielle [name], Danielle [name]                            ACCOUNT NO.:  [id number]   MEDICAL RECORD NO.:  [id number]                   PATIENT TYPE:  INP   LOCATION:  0455                                 FACILITY:  [facility]   PHYSICIAN:  Malachi Pro. Ambrose Mantle, M.D.              DATE OF BIRTH:  1955/11/22   DATE OF ADMISSION:  03/19/2003  DATE OF DISCHARGE:  03/22/2003                                 DISCHARGE SUMMARY   HISTORY OF PRESENT ILLNESS:  The patient is a 55 year old white female, para  2-0-1-2, for abdominal hysterectomy and bilateral salpingo-oophorectomy  because of persistent abnormal uterine bleeding, menorrhagia, dysmenorrhea,  history of complex left ovarian cyst, and a history of pelvic endometriosis.   HOSPITAL COURSE:  The patient underwent abdominal hysterectomy and bilateral  salpingo-oophorectomy on Mar 19, 2003, by Dr. Ambrose Mantle with Dr. Jackelyn Knife  assisting.  Findings were that the left ovarian cyst she previously had was  diminished in size.  There was evidence of previous tubal ligation.  The  right ovary was normal.  The endometriosis that had been noted at the time  of her tubal ligation was no longer present in the posterior cul-de-sac.  The uterus was anterior, probably 1-1/2 times normal size, was quite firm,  and had a couple of nodules on the surface that were either fibroids or  adenomyosis.  The uterus also appeared to be slightly brownish and hugh.  Abdominal hysterectomy and bilateral salpingo-oophorectomy were performed.  The procedure was done under general anesthesia.  Postoperatively, the  patient did well.  On the first postoperative day, her abdomen was soft and  nontender, it was stable, the main complaint was severe nausea.  On the  second postoperative day, she had to be febrile to 100.2 degrees on the  evening of the first postoperative night.  She did not feel as well as she  had on the first postoperative evening.  She vomited after breakfast.  She  had  urinary urgency, but ultrasound of the bladder was not distended.  Later  on the second postoperative day, the patient did better after dinner.  On  the third postoperative day, she was afebrile.  Dr. Jackelyn Knife gave her a  suppository and a regular diet, and tried p.o. Phenergan.  On the third  postoperative day, the patient was ready for discharge.   DISCHARGE MEDICATIONS:  1. Darvocet one or two tabs q.4-6h. p.r.n. pain.  2. Phenergan one tablet q.6h. p.r.n. nausea.   LABORATORY DATA:  An initial hemoglobin of 13.8, hematocrit 40.5, white  blood cell count 12,100.  Platelet count 311,000.  Followup hemoglobins were  12.4 and 11.7, with hematocrits of 36.4 and 34.2.  On the second  postoperative day, she had another white blood cell count to evaluate her  slightly elevated temperature on the first postoperative evening, and her  white blood cell count was 11,400, hemoglobin 11.3, hematocrit 33.8,  platelet count 284,000.  Comprehensive metabolic profile on admission was  normal.  At the time of admission, her differential was 64 segs, 28 lymphs,  6 monos, 2 eosinophils, and 1 basophil.  Urinalysis was negative.  Cathed  urine on the second postoperative day to evaluate fever showed no growth in  one day.  EKG was borderline with no old tracings to compare.  There was  left atrial enlargement.  Chest x-ray was negative for acute cardiac or  pulmonary process.  Pathology report showed a 158 g uterus, the left ovary  contained a follicular cyst of 2.5 cm, fallopian tube had a simple paratubal  cyst, the right ovary had no pathologic diagnosis.  It says in the pathology  report the left fallopian tube, but it was probably the right fallopian  tube, had a mild hydrosalpinx, cervix had squamous metaplasia, endometrium  had proliferative endometrium with no evidence of hyperplasia malignancy,  myometrium had adenomyosis and leiomyomata up to 1 cm.   FINAL DIAGNOSES:  1. Persistent abnormal  uterine bleeding.  2. Menorrhagia.  3. Dysmenorrhea.  4. History of a complex left ovary cyst.  5. Adenomyosis.  6. Leiomyomata.   OPERATION:  Abdominal hysterectomy and bilateral salpingo-oophorectomy.   CONDITION ON DISCHARGE:  Improved.   FOLLOWUP:  The patient was asked by Dr. Jackelyn Knife to return to see me in 7  to 10 days for follow up examination.                                               Malachi Pro. Ambrose Mantle, M.D.    TFH/MEDQ  D:  04/15/2003  T:  04/15/2003  Job:  [id number]

## 2011-03-25 NOTE — Discharge Summary (Signed)
Danielle [name], Danielle [name]                  ACCOUNT NO.:  [id number]   MEDICAL RECORD NO.:  [id number]          PATIENT TYPE:  INP   LOCATION:  5028                         FACILITY:  [facility]   PHYSICIAN:  Kerrin Champagne, M.D.   DATE OF BIRTH:  [birth year]   DATE OF ADMISSION:  06/17/2005  DATE OF DISCHARGE:  06/23/2005                                 DISCHARGE SUMMARY   ADMISSION DIAGNOSES:  1.  Painful degenerative disk disease L2-L3 above L3-S1 fusion.  2.  Gastroesophageal reflux disease.  3.  Hypothyroidism.  4.  Hiatal hernia.  5.  History of headaches.   DISCHARGE DIAGNOSES:  1.  Painful degenerative disk disease L2-L3 above L3-S1 fusion.  2.  Gastroesophageal reflux disease.  3.  Hypothyroidism.  4.  Hiatal hernia.  5.  History of headaches.   OPERATION:  On June 17, 2005, the patient underwent removal of Osteonics  striker rod and screws at the right L3-4 level with central laminectomy at  the L2-3 level with right-sided fasciectomy for right L2-3 transpedicular  lumbar interbody fusing utilizing 9 mm Dupuy Leopard cage robotic pack with  both local and Symphony bone graft.  Posterolateral fusion L2-L3 utilizing  local and Symphony bone graft material.  Instrumentation at L2-3 using  Monarch pedicle screws and rods.  The patient had intraoperative pedicle  screw monitoring and intraoperative use of cell saver.  This was performed  by Dr. Kerrin Champagne, and Wende Neighbors, P.A.-C assisted.   BRIEF HISTORY:  This is a 55 year old lady with multiple back surgeries  beginning in the early 1990s.  She had instrumentation at last surgery in  1997.  She has had increasing instability at L2-3 level and above the L3-to-  sacrum fusion.  She has had hyperlordosis over the segments of the L3-L4 and  L4-L5 levels.  She has mechanical back pain, and diskograms were positive  for replication of the pain.   After much discussion including the risks and benefits of surgery, the  patient  agreed to the above procedure.   HOSPITAL COURSE:  The patient tolerated the surgical procedure quite well.  Incision remained dry without evidence of any infection while in the  hospital.  She had some soreness including left medial foot.  Overall, her  lower extremity pain had been relieved with surgery.  The patient was  ambulating in her room.  She was voiding.  She was stable and afebrile.  It  was felt she could be maintained in her home environment.  She has a strong  family unit.   Plans are for her to be placed in [place], [place], with her  family.  With rest during this trip, she may ride in an automobile.  On the  day of discharge, she was awake, alert, oriented.  She had a bowel movement.  No nausea.  She was anxious to go  home.  Incision was dry.  Steri-Strips  were intact.  Electrolytes were okay.  She was quite stable.   LABORATORY DATA:  Preoperative CBC: Essentially within normal limits.  Final  hemoglobin was 9.4, hematocrit 27.3.  Blood chemistries were grossly normal.  Urinalysis negative for gross urinary tract infection.  She was treated with  perioperative antibiotics.  We repeated the urinalysis in the hospital due  to the fact that nitrite was positive, and many bacteria were seen on her  preoperative urinalysis, and only trace leukocyte esterase was seen,  otherwise within normal limits.   Electrocardiogram showed normal sinus rhythm.   CONDITION ON DISCHARGE:  Improved, stable.   PLAN:  The patient is discharged to her home in the care of her family.  Continue with home medications and diet.  She is to wear a brace while she  is up.  We told her to avoid smoking if at all possible.  She will return to  see Dr. Otelia Sergeant 2 to 3 weeks after date of surgery.  She was given continuing:  1.  Toradol 10 mg 1 p.o. x4 days.  2.  Multivitamins over-the-counter daily.  3.  Percocet 5/25 mg for pain.  4.  Robaxin 500 mg for muscle relaxant.   She was  seen by occupational therapy prior to discharge for some ADLs.      Dooley L. Cherlynn June.      Kerrin Champagne, M.D.  Electronically Signed    DLU/MEDQ  D:  08/24/2005  T:  08/24/2005  Job:  [id number]

## 2011-03-25 NOTE — H&P (Signed)
[facility]  Patient:    Danielle [name], Danielle [name]                         MRN: [id number] Adm. Date:  [id number] Attending:  Thyra Breed CC:         Kerrin Champagne, M.D.   History and Physical  NEW PATIENT EVALUATION:  Danielle [name] is a 55 year old who is sent to [place] by Kerrin Champagne, M.D. for evaluation and consideration for facet rhizotomy with radiofrequency lesioning at L2-3.  The patient has a long history of low back problems which began following an injury on February 09, 1987. In August 1988, she underwent a laminectomy with Colon Flattery. Ollen Bowl, M.D. and got worse. In April of 1989, she underwent a repeat laminectomy at a different level, and she did remarkably well and returned to work for 4 months. Unfortunately, since that time, she has progression in her back with what she described as seven fusions; a multitude of lumbar epidural steroid injections which were unhelpful; a course of the [facility] with Dr. ______ with multiple blocks which were not helpful; trials of opiates which have not been very successful in controlling her pain, except for Dilaudid; trials of tricyclics antidepressants which were initially helpful but later not helpful; trials with anticonvulsants which have not proved to be very helpful either, including Neurontin and Tegretol. She has been through repeated courses of physical therapy. More recently, she has had S1 joint injections which have not been helpful and facet joint injections which last no more than a couple of days. She has had no sustained response to injection therapy.  She describes her pain as a bee-like stinging in the right lower back radiating out to the right hip region with a deep ache over the right calf laterally and into the great toe. It is increased by activity, especially activities of going up and down steps and made better by laying in bed with her legs flexed up to her chest. She has  had at least two EMG, PNCV studies which have been normal. More recently, she has been tried with Celebrex and Zoloft which caused headaches.  Currently, she is on Darvocet which she takes very sparingly as it is not very helpful. She is very active during the day getting up regularly at 7 a.m. and going for a 3-mile walk.  CURRENT MEDICATIONS:  Darvocet, Advil, Synthroid.  ALLERGIES:  CODEINE causes vomiting but no true allergy.  FAMILY HISTORY:  Positive for diabetes, coronary artery disease, bone cancer, and hypertension.  PAST SURGICAL HISTORY:  See HPI. She has had nine back procedures.  SOCIAL HISTORY:  The patient is a half-pack to a pack smoker. She does not drink alcohol. She has been on social security disability since 1994.  ACTIVE MEDICAL PROBLEMS:  Hypothyroidism and renal calculi which have been a recent problem.  REVIEW OF SYSTEMS:  GENERAL:  Negative.  HEAD:  Significant for cervicogenic headaches which occur infrequently. EYES:  Negative. NOSE, MOUTH, THROAT: Negative. EARS: Negative. PULMONARY:  Negative. CARDIOVASCULAR:  Negative. GI: Negative. GU:  Significant for renal calculi. MUSCULOSKELETAL:  See HPI. NEUROLOGICAL:  Negative except as mentioned in HPI. No history of seizures or stroke. HEMATOLOGIC: Negative. CUTANEOUS:  Negative. ENDOCRINE:  History of hypothyroidism. PSYCHIATRIC:  Negative. ALLERGY/IMMUNOLOGIC:  Negative.  PHYSICAL EXAMINATION:  VITAL SIGNS:  Blood pressure is 138/81, heart rate is 85, respiratory rate is 16, O2 saturation is 98%, pain  level is 4/10, temperature is 97.8.  GENERAL:  This is a very pleasant female in no acute distress.  HEENT:  Head was normocephalic, atraumatic. Eyes:  Extraocular movements intact with conjunctivae and sclerae clear. Nose:  Patent nares. Oropharynx: Free of lesions.  NECK:  Demonstrated mild restriction of range of motion with carotids 2+ and symmetric without bruits.  LUNGS:  Clear.  HEART:   Regular rate and rhythm.  BREASTS, ABDOMEN, PELVIC, RECTAL:  Not performed.  EXTREMITIES:  No clubbing, cyanosis, or edema with radial pulses and dorsalis pedis pulses 2+ and symmetric.  NEUROLOGICAL:  The patient is oriented x 4. Cranial nerves 2 through 12 are grossly intact. Deep tendon reflexes were 2+ and symmetric in the upper extremities, 0 to 1+ at the knees and ankles bilaterally with downgoing toes. Motor was 5/5 with symmetric bulk and tone. Sensory exam was intact to vibratory sense and to scratch sense. Coordination was grossly intact.  BACK:  Increased pain on hyperextension to 15 degrees, especially the lumbosacral region with reduced pain on forward flexion. Straight leg raise signs were negative. Pressure over the piriformis muscle on the right side initially elicited the discomfort but later did not.  LABORATORY DATA:  An MRI interpretation from January 06, 2000, by Dr. Corliss Skains shows degenerative disk space narrowing at L5-S1, mild facet joint prominence at 2-3, scarring at L3-4, at L4-5 scarring, at L5-S1 a broad-based, central disk bulge with unremarkable facet joints.  IMPRESSION: 1. Chronic low back pain syndrome which is multifactorial on the basis of her    lumbar spondylosis. 2. Hypothyroidism per primary care physician. 3. Renal calculi per primary care physician.  DISPOSITION: 1. The patient does not exhibit a great deal of discomfort at the L2-3 region    on exam today. Most of her pain is down closer to the lumbosacral region. I    advised her that I would like to look at some plain films of that area to    ascertain whether it is feasible to try and do any facet blocks in that    region. I advised her that the facet block done by Dr. ______ which    recently helped her was an intra-articular block which is different from a    facet joint nerve block in that she may not get any benefits from that. I    also advised her that if she only had 2 or 3 days  worth of benefits from     an intra-articular block that it probably was not a successful block as    they should last for several weeks to months. She has gone through a    multitude of blocks, and I am not sure that she is really a candidate for    further interventional therapy such as this. She might be a candidate for    an intraspinal opiate pump, and she did respond to the Dilaudid orally but    does not respond to the others. She would need a trial of intraspinal    morphine as some point to pursue this, and I did not review this with her    at this time. 2. I advised the patient that a facet nerve block would not help her with her    probable L5 radiculopathy, as it will only help the nerves locally in the    back. 3. I did go ahead and take the liberty of starting her on Zanaflex per the    [facility]  protocol starting with 2 mg in the evening and working gradually up.    She was given a prescription for the 4 mg tablets, and I advised her to    break these. She was advised to let us know if she had problems with side    effects, and she was given literature on this. 4. Follow up with me in 4 weeks. 5. She will bring by a plain film so I can assess whether her previous    surgeries would interfere with needle placement into the facet nerve    regions. DD:  01/10/01 TD:  01/10/01 Job: [id number]

## 2011-03-25 NOTE — H&P (Signed)
NAME:  Danielle [name], Danielle [name]                            ACCOUNT NO.:  [id number]   MEDICAL RECORD NO.:  [id number]                   PATIENT TYPE:  INP   LOCATION:  0455                                 FACILITY:  [facility]   PHYSICIAN:  Malachi Pro. Ambrose Mantle, M.D.              DATE OF BIRTH:  February 18, 1956   DATE OF ADMISSION:  03/19/2003  DATE OF DISCHARGE:  03/22/2003                                HISTORY & PHYSICAL   HISTORY OF PRESENT ILLNESS:  This is a 55 year old, white female, para 2-0-1-  2, admitted to the hospital for abdominal hysterectomy, bilateral salpingo-  oophorectomy because of persistent abnormal uterine bleeding, menorrhagia,  dysmenorrhea, history of a complex left ovarian cyst, and a history of  pelvic endometriosis.  The patient reported on January 29, 2003, that she had  bled daily since October 28, 2002.  Two weeks prior to the visit on January 29, 2003, she had some pelvic pain.  She was noted to have a left ovarian  cyst that was 3-4 cm in diameter, cystic, and located in the cul-de-sac.  A  Pipelle was done for evaluation of the endometrium, and it was benign,  showing proliferative endometrium with breakdown.  There was a mildly  complicated left ovarian cyst on ultrasound.  The cyst measured 4.7 x 2.7 x  3.6 cm, and the left ovary in toto, measured 5.7 x 3.4 x 4.4 cm.  The  patient was given Provera 10 mg daily for 20 days.  She was seen back on  March 07, 2003.  At that time, she reported that for six months her periods  had been much more painful and heavy.  She stopped bleeding for three days  with the Provera but then continued to bleed.  The left ovary at that exam  was 5 cm when she was scheduled for surgery.   PAST MEDICAL HISTORY:  1. Allergies:  CODEINE, IVP DYE.  2. She was taking Levoxyl.  3. Operations:  She had had nine back surgeries and a breast reduction.  4. She had no major illnesses, no heart problems, no alcohol, smoked one     pack a day.   REVIEW  OF SYSTEMS:  Noncontributory.   FAMILY HISTORY:  Mother is 59, possible ovarian cancer.  Father died at 74  of liver cancer.  He also had high blood pressure and diabetes.  The patient  had a sister and brother, both with high blood pressure and diabetes, and  the brother had problem with heart blockage.  The patient herself is  disabled from back problems since 1995.  She went to [place], [employer], worked at [employer] for 10 years, drove a truck  for 3-4 years, and Social Services for four years.   PHYSICAL EXAMINATION:  GENERAL:  Well-developed, somewhat obese, white  female, weighing 167.5 pounds.  VITAL SIGNS:  Blood pressure  124/80, pulse 80.  HEENT:  No cranial abnormalities.  There was a lower partial and upper  partial dental plates.  NECK:  Supple without thyromegaly.  BREASTS:  Reduction scars without masses.  HEART:  Normal size and sounds.  No murmurs.  LUNGS:  Clear to P&A.  ABDOMEN:  Soft and nontender.  VULVA AND VAGINA:  Clean.  Cervix clean.  Uterus anterior, normal size.  ADNEXA:  The right is clear; left 3 x 3 cm left ovarian cyst.  RECTAL:  Done in April 2004, was negative.   ADMITTING IMPRESSIONS:  1. Menorrhagia.  2. Dysmenorrhea.  3. History of complex left ovarian cyst.  4. History of pelvic endometriosis and persistent abnormal uterine bleeding.  5. The patient is admitted for abdominal hysterectomy and bilateral salpingo-     oophorectomy.  She has been counseled about the risks of surgery     including but not limited to heart attack, stroke, pulmonary embolus,     wound disruption, hemorrhage with the need for re-operation and/or     transfusion, fistula formation, nerve injury, and intestinal obstruction.     She understands and agrees to proceed.                                               Malachi Pro. Ambrose Mantle, M.D.    TFH/MEDQ  D:  05/22/2003  T:  05/22/2003  Job:  [id number]

## 2011-03-27 ENCOUNTER — Observation Stay (HOSPITAL_COMMUNITY)
Admission: EM | Admit: 2011-03-27 | Discharge: 2011-03-28 | Disposition: A | Discharge status: Home / Self Care | Payer: Medicare Other | Attending: Internal Medicine | Admitting: Internal Medicine

## 2011-03-27 ENCOUNTER — Emergency Department (HOSPITAL_COMMUNITY): Payer: Medicare Other

## 2011-03-27 DIAGNOSIS — Z79899 Other long term (current) drug therapy: Secondary | ICD-10-CM | POA: Insufficient documentation

## 2011-03-27 DIAGNOSIS — R29810 Facial weakness: Principal | ICD-10-CM | POA: Insufficient documentation

## 2011-03-27 DIAGNOSIS — I10 Essential (primary) hypertension: Secondary | ICD-10-CM | POA: Insufficient documentation

## 2011-03-27 DIAGNOSIS — M7989 Other specified soft tissue disorders: Secondary | ICD-10-CM | POA: Insufficient documentation

## 2011-03-27 DIAGNOSIS — Z8673 Personal history of transient ischemic attack (TIA), and cerebral infarction without residual deficits: Secondary | ICD-10-CM | POA: Insufficient documentation

## 2011-03-27 DIAGNOSIS — Z7982 Long term (current) use of aspirin: Secondary | ICD-10-CM | POA: Insufficient documentation

## 2011-03-27 DIAGNOSIS — E039 Hypothyroidism, unspecified: Secondary | ICD-10-CM | POA: Insufficient documentation

## 2011-03-27 DIAGNOSIS — E876 Hypokalemia: Secondary | ICD-10-CM | POA: Insufficient documentation

## 2011-03-27 DIAGNOSIS — R262 Difficulty in walking, not elsewhere classified: Secondary | ICD-10-CM | POA: Insufficient documentation

## 2011-03-27 DIAGNOSIS — R51 Headache: Secondary | ICD-10-CM | POA: Insufficient documentation

## 2011-03-27 LAB — CBC
Hemoglobin: 14.2 g/dL (ref 12.0–15.0)
MCH: 30.1 pg (ref 26.0–34.0)
MCV: 90.4 fL (ref 78.0–100.0)
Platelets: 305 10*3/uL (ref 150–400)
RBC: 4.71 MIL/uL (ref 3.87–5.11)
WBC: 10.9 10*3/uL — ABNORMAL HIGH (ref 4.0–10.5)

## 2011-03-27 LAB — COMPREHENSIVE METABOLIC PANEL
AST: 34 U/L (ref 0–37)
Albumin: 4.7 g/dL (ref 3.5–5.2)
Alkaline Phosphatase: 95 U/L (ref 39–117)
CO2: 26 mEq/L (ref 19–32)
Chloride: 99 mEq/L (ref 96–112)
GFR calc Af Amer: >60 mL/min (ref >60)
GFR calc non Af Amer: >60 mL/min (ref >60)
Potassium: 3.2 mEq/L — ABNORMAL LOW (ref 3.5–5.1)
Total Bilirubin: 0.5 mg/dL (ref 0.3–1.2)

## 2011-03-27 LAB — URINE MICROSCOPIC-ADD ON

## 2011-03-27 LAB — DIFFERENTIAL
Eosinophils Absolute: 0.2 10*3/uL (ref 0.0–0.7)
Lymphs Abs: 4.1 10*3/uL — ABNORMAL HIGH (ref 0.7–4.0)
Monocytes Relative: 6 % (ref 3–12)
Neutro Abs: 5.9 10*3/uL (ref 1.7–7.7)
Neutrophils Relative %: 54 % (ref 43–77)

## 2011-03-27 LAB — URINALYSIS, ROUTINE W REFLEX MICROSCOPIC
Hgb urine dipstick: NEGATIVE
Nitrite: NEGATIVE
Specific Gravity, Urine: 1.025 (ref 1.005–1.030)
Urobilinogen, UA: 0.2 mg/dL (ref 0.0–1.0)

## 2011-03-28 ENCOUNTER — Observation Stay (HOSPITAL_COMMUNITY): Payer: Medicare Other

## 2011-03-28 ENCOUNTER — Inpatient Hospital Stay (HOSPITAL_COMMUNITY): Admit: 2011-03-28 | Discharge: 2011-03-28 | Disposition: A | Discharge status: Home / Self Care | Payer: Medicare Other

## 2011-03-28 DIAGNOSIS — G459 Transient cerebral ischemic attack, unspecified: Secondary | ICD-10-CM

## 2011-03-28 LAB — COMPREHENSIVE METABOLIC PANEL
ALT: 26 U/L (ref 0–35)
AST: 30 U/L (ref 0–37)
Alkaline Phosphatase: 89 U/L (ref 39–117)
CO2: 28 mEq/L (ref 19–32)
GFR calc Af Amer: >60 mL/min (ref >60)
Glucose, Bld: 119 mg/dL — ABNORMAL HIGH (ref 70–99)
Potassium: 2.9 mEq/L — ABNORMAL LOW (ref 3.5–5.1)
Sodium: 136 mEq/L (ref 135–145)
Total Protein: 7.2 g/dL (ref 6.0–8.3)

## 2011-03-28 LAB — LIPID PANEL
Total CHOL/HDL Ratio: 4.2 RATIO
Triglycerides: 186 mg/dL — ABNORMAL HIGH (ref <150)
VLDL: 37 mg/dL (ref 0–40)

## 2011-03-28 LAB — MAGNESIUM: Magnesium: 1.9 mg/dL (ref 1.5–2.5)

## 2011-03-28 LAB — DIFFERENTIAL
Basophils Absolute: 0.1 10*3/uL (ref 0.0–0.1)
Basophils Relative: 1 % (ref 0–1)
Eosinophils Absolute: 0.3 10*3/uL (ref 0.0–0.7)
Eosinophils Relative: 3 % (ref 0–5)
Monocytes Absolute: 0.6 10*3/uL (ref 0.1–1.0)

## 2011-03-28 LAB — TSH: TSH: 5.938 u[IU]/mL — ABNORMAL HIGH (ref 0.350–4.500)

## 2011-03-28 LAB — CBC
MCHC: 32.8 g/dL (ref 30.0–36.0)
RDW: 12.8 % (ref 11.5–15.5)

## 2011-03-28 LAB — HEMOGLOBIN A1C: Mean Plasma Glucose: 123 mg/dL — ABNORMAL HIGH (ref <117)

## 2011-03-28 LAB — PROTIME-INR
INR: 0.94 (ref 0.00–1.49)
Prothrombin Time: 12.8 seconds (ref 11.6–15.2)

## 2011-03-28 NOTE — Discharge Summary (Addendum)
Danielle [name], Danielle [name]                  ACCOUNT NO.:  [id number]  MEDICAL RECORD NO.:  [id number]  LOCATION:  EE                           FACILITY:  [facility]  PHYSICIAN:  Wilson Singer, M.D.DATE OF BIRTH:  1956/06/15  DATE OF ADMISSION:  03/28/2011 DATE OF DISCHARGE:  05/21/2012LH                              DISCHARGE SUMMARY   FINAL DISCHARGE DIAGNOSIS: 1. Left facial abnormality. 2. Hypercalcemia. 3. Hypokalemia.  CONDITION ON DISCHARGE:  Stable.  MEDICATIONS ON DISCHARGE:  We will continue her home medications for time-being, aspirin 81 mg daily, Levoxyl 175 mcg daily, HCTZ 12.5 mg daily, simvastatin 80 mg at bedtime.  HISTORY:  This interesting 55 year old lady was admitted yesterday when she had a sudden onset of what appeared to be the left facial weakness and movement or pushing effect of the face to the right.  She also felt her tongue was being pushed over to the right.  She did not give a description of typical stroke-like symptoms.  She did describe some slurred speech at that time.  There was no limb weakness.  There was no real confusion.  This episode lasted in total less than 2 hours and now she feels back to normal.  She said she has had a similar episode before and she had workup for TI, but all investigations were negative.  Please see initial history and physical examination done by Dr. Vania Rea.  HOSPITAL PROGRESS:  She was admitted and now she feels back to normal. The only abnormality there is of significance found so far is elevated calcium level.  In fact, her calcium is 12.1 with a albumin in the normal range of 4.1.  She says that she has a history of hypercalcemia. In fact, she tells me that her father and her brother have and did have hypercalcemia.  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 However, she has not had any further investigations for this.  Since, she is back to her usual self I think workup can be continued as  an outpatient.  She has had an echocardiogram and EEG completed while she is in the hospital.  She was due to have an MRI of the brain, but I think this really can be done even as an outpatient.  She has had bilateral carotid Dopplers done as an inpatient and results were pending.  DISPOSITION:  We will replete her potassium as it was low this morning. Urine studies were indicative of urinary tract infection, but she really did not have any symptoms at all of such.  She had very small amount of leukocytes.  Reportedly, there are many bacteria, but again her symptoms are very minimal, so I would await cultures before starting any antibiotics at this stage.  I have asked her to go and see Dr. Fransico Him, our local endocrinologist for her hypercalcemia.  Also at this time, followup needs to be done regarding the results of the echo and EEG.  Consideration can be given to doing an MRI of the brain, but I rethink the yield will be low.     Wilson Singer, M.D.     NCG/MEDQ  D:  03/28/2011  T:  03/28/2011  Job:  [id number]  cc:   Purcell Nails, MD Fax: [phone]  Electronically Signed by Lilly Cove M.D. on 04/24/2011 07:10:06 AM

## 2011-03-29 LAB — URINE CULTURE
Colony Count: NO GROWTH
Culture  Setup Time: 201205211235
Culture: NO GROWTH

## 2011-03-29 NOTE — Procedures (Signed)
NAME:  Danielle [name], Danielle [name]                  ACCOUNT NO.:  [id number]  MEDICAL RECORD NO.:  [id number]           PATIENT TYPE:  A  LOCATION:  EE                           FACILITY:  [facility]  PHYSICIAN:  Mindie Rawdon A. Gerilyn Pilgrim, M.D. DATE OF BIRTH:  [birth year]  DATE OF PROCEDURE:  03/28/2011 DATE OF DISCHARGE:  03/28/2011                             EEG INTERPRETATION   INDICATIONS:  This is a 55 year old who presents with acute onset of slurred speech, facial drooping, and headache, the study is being done to evaluate for seizures.  MEDICATIONS:  Aggrenox, Lovenox, levothyroxine, Crestor, and aspirin.  ANALYSIS:  A 16-channel recording using standard 10-20 measurements is conducted for 21 minutes.  There is a well-formed posterior dominant rhythm of 8.5 Hz which attenuates with eye opening.  There is a beta activity observed in the frontal areas.  Awake and drowsy activities are recorded.  Photic stimulation and hypoventilation are carried out without abnormal changes in the background activity.  There is no focal or lateralized slowing.  There is no epileptiform activity observed.  IMPRESSION:  Normal recording of awake and drowsy states.     Kashis Penley A. Gerilyn Pilgrim, M.D.     KAD/MEDQ  D:  03/29/2011  T:  03/29/2011  Job:  [id number]

## 2011-03-30 NOTE — H&P (Signed)
NAME:  Danielle [name], Danielle [name] NO.:  [id number]  MEDICAL RECORD NO.:  [id number]           PATIENT TYPE:  O  LOCATION:  A307                          FACILITY:  [facility]  PHYSICIAN:  Vania Rea, M.D. DATE OF BIRTH:  05/31/1956  DATE OF ADMISSION:  03/27/2011 DATE OF DISCHARGE:  LH                             HISTORY & PHYSICAL   PRIMARY CARE PHYSICIAN:  Unassigned.  CARDIOLOGIST:  Noralyn Pick. Eden Emms, MD, [facility]  CHIEF COMPLAINT:  This is a 55 year old Caucasian lady on disability because of chronic back pain due to degenerative joint disease from a remote work related injury who discontinued tobacco use 3 weeks ago after about 30 years up to one pack per day and who has mild hypertension, on low-dose hydrochlorothiazide as well as hyperlipidemia. The patient says she keeps herself fairly active, working out in the yard and while out in the yard today just before the arrival of her daughter, she had sudden onset of twisting of her face with a lower half of her face moving over to the right.  Her tongue also being pushed over to the right.  She had difficulty moving her tongue and slurring of her speech when she attempts to speak.  The patient also said that her arms and legs began to swell and she had difficulty walking.  Her daughter drove up as about the same time and saw her, confirms that her face was twisted and they both came to the conclusion that she was having a stroke.  She was brought to the emergency room, by which time, the symptoms had resolved.  The Hospitalist Service was called to admit the patient for transient ischemic attack.  The patient says the symptoms were associated with sharp left-sided pain going from the left occiput coming around to her forehead.  She had no chest pain, nausea, or vomiting.  She has been having no fever.  The patient reports that she has had similar symptoms before and was told it was a TIA, although she was somewhat  irritated with her doctors because of extensive investigations they could find no cause for it and eventually, she says she no longer visits Dr. Leveda Anna in the [facility] because he was trying to convince her that she was depressed and she is sure she is not depressed.  PAST MEDICAL HISTORY: 1. Chronic back pain, status post 10 lumbar surgeries per the patient.     Orthopedic surgeon is Dr. Otelia Sergeant. 2. Questionable past history of TIA. 3. Mild hypertension. 4. A sequela of hypothyroidism. 5. 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 6. Past history of hypokalemia. 7. Hyperlipidemia.  MEDICATIONS: 1. Aspirin 81 mg daily. 2. Levoxyl 175 mcg failure. 3. Hydrochlorothiazide 12.5 mg daily. 4. Simvastatin 80 mg at bedtime. 5. Vicodin p.r.n. for back pain.  ALLERGIES: 1. IVP DYE. 2. CODEINE. 3. KEFLEX.  SOCIAL HISTORY:  Tobacco use as noted above.  She has discontinued 3 weeks ago.  Denies alcohol or illicit drug use.  She is on disability because of back pain.  FAMILY HISTORY:  Significant for coronary artery disease, diabetes, hypertension, strokes, cancers, congestive heart failure.  REVIEW OF SYSTEMS:  Other than noted above unremarkable.  PHYSICAL EXAMINATION:  GENERAL:  Pleasant middle-aged Caucasian lady, reclining in the stretcher, not acutely distressed at this time. VITAL SIGNS:  Her temperature is 98.2, pulse 90, respiration 12, blood pressure 120/88, she is saturating at 95% on room air. HEENT:  Pupils are round and equal.  Mucous membranes pink.  Anicteric. No cervical lymphadenopathy.  No thyromegaly or carotid bruit.  No jugular venous distention. CHEST:  Clear to auscultation bilaterally. CARDIOVASCULAR:  Regular rhythm.  No murmur. ABDOMEN:  Obese, soft, nontender.  No masses. EXTREMITIES:  Without edema.  She has 2+ dorsalis pedis pulses bilaterally. CENTRAL NERVOUS SYSTEM:  Cranial nerves II through XII are  grossly intact.  She has no focal lateralizing signs.  LABORATORY DATA:  White count is 9.4, hemoglobin 13, platelets 286. Differential is unremarkable.  Her sodium is 135, potassium 3.2, chloride 99, CO2 26, glucose 105, BUN 15, creatinine 0.7.  Her liver functions are unremarkable.  Her calcium is elevated at 12.8 over the past couple of years.  It was ranged between 10.8 to now its highest value.  Urinalysis shows hazy urine, small amount of bilirubin, 15 ketones, leukocyte esterase small.  Urine microscopy shows a few squamous cells, 21-50 white cells, many bacteria.  CT scan of the head shows no acute abnormality.  ASSESSMENT: 1. Transient facial distortion, questionable transient ischemic     attack, questionable seizure, questionable psychosomatic disorder     secondary to metabolic derangement. 2. Hypokalemia. 3. Hypercalcemia, progressive. 4. Hypothyroidism. 5. Tobacco abuse. 6. Hyperlipidemia. 7. Hypertension, controlled. 8. Chronic back pain due to degenerative joint disease.  PLAN:  We will admit this lady for hydration, risk factor reduction, MRI and MRA in the morning to rule out strokes, rule out tumors, rule out other.  We will get an EEG to rule out seizures.  We have scanned the computer and although we see an ultrasound of the parathyroid looking for adenoma, we see no biochemical workup for hypercalcemia.  We will replace potassium and check a lipid panel in the morning.  Other plans as per orders.     Vania Rea, M.D.     LC/MEDQ  D:  03/28/2011  T:  03/28/2011  Job:  [id number]  Electronically Signed by Vania Rea M.D. on 03/30/2011 09:39:29 PM

## 2011-04-20 ENCOUNTER — Encounter: Payer: Self-pay | Admitting: Family Medicine

## 2011-04-20 ENCOUNTER — Ambulatory Visit (INDEPENDENT_AMBULATORY_CARE_PROVIDER_SITE_OTHER): Payer: Medicare Other | Admitting: Family Medicine

## 2011-04-20 VITALS — BP 127/75 | HR 88 | Temp 98.4°F | Ht 66.0 in | Wt 180.0 lb

## 2011-04-20 DIAGNOSIS — R300 Dysuria: Secondary | ICD-10-CM

## 2011-04-20 DIAGNOSIS — E7800 Pure hypercholesterolemia, unspecified: Secondary | ICD-10-CM

## 2011-04-20 DIAGNOSIS — G459 Transient cerebral ischemic attack, unspecified: Secondary | ICD-10-CM

## 2011-04-20 DIAGNOSIS — I10 Essential (primary) hypertension: Secondary | ICD-10-CM

## 2011-04-20 LAB — POCT URINALYSIS DIPSTICK
Bilirubin, UA: NEGATIVE
Blood, UA: NEGATIVE
Nitrite, UA: NEGATIVE
Protein, UA: NEGATIVE
Urobilinogen, UA: 0.2
pH, UA: 5.5

## 2011-04-20 MED ORDER — POTASSIUM CHLORIDE CRYS ER 20 MEQ PO TBCR: 20.0000 meq | EXTENDED_RELEASE_TABLET | Freq: Two times a day (BID) | ORAL | Status: DC

## 2011-04-20 MED ORDER — SIMVASTATIN 40 MG PO TABS: 40.0000 mg | ORAL_TABLET | Freq: Every evening | ORAL | Status: DC

## 2011-04-20 MED ORDER — ASPIRIN 325 MG PO TABS: 325.0000 mg | ORAL_TABLET | Freq: Every day | ORAL | Status: DC

## 2011-04-20 NOTE — Patient Instructions (Signed)
I refill your cholesterol medicine Increase aspirin to adult 325 mg daily I will call with lab work. I want you to take potassium pill every day.   Quit smoking - even 2 per day in increases your risk of stoke.

## 2011-04-21 LAB — COMPLETE METABOLIC PANEL WITH GFR
Albumin: 4.5 g/dL (ref 3.5–5.2)
Alkaline Phosphatase: 88 U/L (ref 39–117)
BUN: 13 mg/dL (ref 6–23)
CO2: 26 mEq/L (ref 19–32)
Calcium: 11.2 mg/dL — ABNORMAL HIGH (ref 8.4–10.5)
Chloride: 103 mEq/L (ref 96–112)
GFR, Est Non African American: >60 mL/min (ref >60)
Glucose, Bld: 116 mg/dL — ABNORMAL HIGH (ref 70–99)
Potassium: 4.2 mEq/L (ref 3.5–5.3)
Sodium: 139 mEq/L (ref 135–145)
Total Protein: 7.1 g/dL (ref 6.0–8.3)

## 2011-04-21 MED ORDER — CIPROFLOXACIN HCL 500 MG PO TABS: 500.0000 mg | ORAL_TABLET | Freq: Two times a day (BID) | ORAL | Status: AC

## 2011-04-21 NOTE — Progress Notes (Signed)
  Subjective:    Patient ID: Danielle [name], female    DOB: 01-24-1956, 55 y.o.   MRN: [id number]  HPI  Admitted to [facility] with TIA symptoms.  Resolved.  Reviewed labs and Xrays in E chart.  No further WU needed.  She was on ASA 81 mg.  For some reason, she has not been taking her cholesterol meds.  No neuro symptoms since discharge.  Also noted to have low K and high Ca++ (chronic) on admit  Does have dysuria.    Review of Systems     Objective:   Physical Exam VS noted.  Good BP Cardiac RRR  Neuro WNL       Assessment & Plan:

## 2011-04-21 NOTE — Assessment & Plan Note (Signed)
Restart statin and increase ASA to 325

## 2011-04-21 NOTE — Assessment & Plan Note (Addendum)
Nl parathyroid scan 12/17/08  Recheck level

## 2011-04-21 NOTE — Assessment & Plan Note (Addendum)
UTI, cipro

## 2011-04-21 NOTE — Assessment & Plan Note (Signed)
Restart statin  

## 2011-05-08 ENCOUNTER — Other Ambulatory Visit: Payer: Self-pay | Admitting: Family Medicine

## 2011-05-08 NOTE — Telephone Encounter (Signed)
Refill request

## 2011-06-10 ENCOUNTER — Other Ambulatory Visit: Payer: Self-pay | Admitting: Family Medicine

## 2011-06-10 DIAGNOSIS — E039 Hypothyroidism, unspecified: Secondary | ICD-10-CM

## 2011-06-10 MED ORDER — LEVOTHYROXINE SODIUM 175 MCG PO TABS: 175.0000 ug | ORAL_TABLET | Freq: Every day | ORAL | Status: DC

## 2011-06-10 NOTE — Assessment & Plan Note (Signed)
Refilled med via fax request

## 2011-07-29 LAB — COMPREHENSIVE METABOLIC PANEL
AST: 31
Albumin: 3.9
Alkaline Phosphatase: 89
BUN: 11
CO2: 27
Chloride: 102
GFR calc non Af Amer: >60
Potassium: 3.3 — ABNORMAL LOW
Total Bilirubin: 0.7

## 2011-07-29 LAB — CBC
HCT: 43.8
Hemoglobin: 14.6
MCV: 88.7
Platelets: 336
RDW: 12.4

## 2011-07-29 LAB — DIFFERENTIAL
Basophils Absolute: 0.3 — ABNORMAL HIGH
Basophils Relative: 3 — ABNORMAL HIGH
Eosinophils Relative: 3
Monocytes Absolute: 0.7
Neutro Abs: 5

## 2011-07-29 LAB — PROTIME-INR: Prothrombin Time: 12.7

## 2011-07-29 LAB — CK TOTAL AND CKMB (NOT AT ARMC): Relative Index: 1.4

## 2011-08-18 LAB — DIFFERENTIAL
Lymphocytes Relative: 18
Lymphs Abs: 2.7
Monocytes Relative: 4
Neutro Abs: 11.4 — ABNORMAL HIGH
Neutrophils Relative %: 76

## 2011-08-18 LAB — POCT CARDIAC MARKERS
CKMB, poc: <1 — ABNORMAL LOW
CKMB, poc: <1 — ABNORMAL LOW
Myoglobin, poc: 35

## 2011-08-18 LAB — I-STAT 8, (EC8 V) (CONVERTED LAB)
Acid-Base Excess: 2
Operator id: 270651
Potassium: 3.7
Sodium: 141
TCO2: 30
pH, Ven: 7.377 — ABNORMAL HIGH

## 2011-08-18 LAB — POCT I-STAT CREATININE
Creatinine, Ser: 0.8
Operator id: 270651

## 2011-08-18 LAB — CBC
RBC: 4.56
WBC: 14.9 — ABNORMAL HIGH

## 2011-09-09 ENCOUNTER — Encounter: Payer: Self-pay | Admitting: Family Medicine

## 2011-09-09 ENCOUNTER — Telehealth: Payer: Self-pay | Admitting: *Deleted

## 2011-09-09 ENCOUNTER — Ambulatory Visit (INDEPENDENT_AMBULATORY_CARE_PROVIDER_SITE_OTHER): Payer: Medicare Other | Admitting: Family Medicine

## 2011-09-09 VITALS — BP 122/88 | HR 90 | Temp 99.2°F | Ht 66.0 in | Wt 185.0 lb

## 2011-09-09 DIAGNOSIS — R002 Palpitations: Secondary | ICD-10-CM

## 2011-09-09 DIAGNOSIS — R300 Dysuria: Secondary | ICD-10-CM

## 2011-09-09 LAB — POCT UA - MICROSCOPIC ONLY

## 2011-09-09 LAB — POCT URINALYSIS DIPSTICK
Ketones, UA: NEGATIVE
Protein, UA: NEGATIVE
Spec Grav, UA: 1.025
Urobilinogen, UA: 0.2
pH, UA: 5.5

## 2011-09-09 LAB — BASIC METABOLIC PANEL WITH GFR
Chloride: 100 mEq/L (ref 96–112)
GFR, Est African American: >89 mL/min (ref >89)
GFR, Est Non African American: >89 mL/min (ref >89)
Potassium: 4.9 mEq/L (ref 3.5–5.3)
Sodium: 138 mEq/L (ref 135–145)

## 2011-09-09 MED ORDER — ESTROGENS, CONJUGATED 0.625 MG/GM VA CREA: 0.5000 g | TOPICAL_CREAM | VAGINAL | Status: DC

## 2011-09-09 MED ORDER — SULFAMETHOXAZOLE-TRIMETHOPRIM 800-160 MG PO TABS: 1.0000 | ORAL_TABLET | Freq: Two times a day (BID) | ORAL | Status: AC

## 2011-09-09 NOTE — Telephone Encounter (Signed)
OK to change to 48 hour holter.

## 2011-09-09 NOTE — Patient Instructions (Signed)
You have two new prescriptions Antibiotic for your bladder infection Estrogen vaginal cream that hopefully will help prevent the bladder infections I will check your thyroid and potassium today and call with the results. The nurse will set you up with the Holter monitor to try to find out exactly what type of palpitations that you have.

## 2011-09-09 NOTE — Progress Notes (Signed)
  Subjective:    Patient ID: Danielle [name], female    DOB: 1956/03/23, 55 y.o.   MRN: [id number]  HPI Has frequent UTIs.  Has seen uro who does not have any great intervention.  Just completed a course of cipro 5 days ago.  Now with urgency and pressure.  Has had frequent UTIs since TAH & BSO. Also having worsening palpitations.  We have never caught her arrythmia.  At one point she needed to pull over to the side of the road.  Also, I am worried about her recent two TIAs - which could be related to A fib.    Review of Systems     Objective:   Physical Exam Cardiac RRR with 1/6 SEM Abd benign        Assessment & Plan:

## 2011-09-09 NOTE — Assessment & Plan Note (Addendum)
Frequent UTI.  Rx with septra (recent cipro and allergic to keflex).  Also premarin cream to see if it decreases frequency of UTIs

## 2011-09-09 NOTE — Telephone Encounter (Signed)
Called was left on  Physicians/Pharmacy voicemail from [facility]  stating they received referral for 72 hour heart monitor. They do not do 72 hours monitoring. They do up to 48 hours .  Then can do Event Monitoring for 7 days more. Will forward to  Dr. Leveda Anna for further instruction.

## 2011-09-09 NOTE — Assessment & Plan Note (Signed)
Worried about PAF and stroke risk.  Check K+ and TSH.  Holter.  Will get 72 hour holter since symptoms don't occur every day.

## 2011-09-12 NOTE — Telephone Encounter (Signed)
Spoke with Windell Moulding and advised her that 48 hour monitor will be ok.

## 2011-09-15 ENCOUNTER — Encounter (INDEPENDENT_AMBULATORY_CARE_PROVIDER_SITE_OTHER): Payer: Medicare Other

## 2011-09-15 DIAGNOSIS — R002 Palpitations: Secondary | ICD-10-CM

## 2011-09-21 NOTE — Progress Notes (Unsigned)
  Subjective:    Patient ID: Danielle [name], female    DOB: 26-Dec-1955, 55 y.o.   MRN: [id number]  HPI  Called and discussed reassuring Holter monitor.  She did have several spells of palpitations while wearing the monitor.  Only PACs were noted.  I am reassured.  Given the option of leaving meds as is versus switching HCTZ to Beta Blocker.  She elected to leave as is.  Recheck in 2-3 months, sooner prn worsening of spells.    Review of Systems     Objective:   Physical Exam        Assessment & Plan:

## 2011-10-21 ENCOUNTER — Ambulatory Visit (INDEPENDENT_AMBULATORY_CARE_PROVIDER_SITE_OTHER): Payer: Medicare Other | Admitting: Family Medicine

## 2011-10-21 ENCOUNTER — Encounter: Payer: Self-pay | Admitting: Family Medicine

## 2011-10-21 VITALS — BP 135/93 | HR 91 | Temp 98.5°F | Ht 65.0 in | Wt 186.0 lb

## 2011-10-21 DIAGNOSIS — I10 Essential (primary) hypertension: Secondary | ICD-10-CM

## 2011-10-21 DIAGNOSIS — R300 Dysuria: Secondary | ICD-10-CM

## 2011-10-21 DIAGNOSIS — R002 Palpitations: Secondary | ICD-10-CM

## 2011-10-21 LAB — POCT URINALYSIS DIPSTICK
Bilirubin, UA: NEGATIVE
Glucose, UA: NEGATIVE
Nitrite, UA: NEGATIVE
Urobilinogen, UA: 0.2

## 2011-10-21 MED ORDER — METOPROLOL SUCCINATE ER 50 MG PO TB24: 50.0000 mg | ORAL_TABLET | Freq: Every day | ORAL | Status: DC

## 2011-10-21 MED ORDER — CIPROFLOXACIN HCL 500 MG PO TABS: 500.0000 mg | ORAL_TABLET | Freq: Two times a day (BID) | ORAL | Status: AC

## 2011-10-21 NOTE — Assessment & Plan Note (Signed)
Benign by Holter.  PACs.  Will add Beta Blocker.

## 2011-10-21 NOTE — Patient Instructions (Signed)
I am putting you on a drug that should help both high blood pressure and palpitations.  Let me know if it works.  I can increase the dose if needed.  Generally, this is a very well tolerated drug with minimal or no side effects.  Get a mammogram See me in a month or two when you have something to tell me.

## 2011-10-21 NOTE — Progress Notes (Signed)
Subjective:     Patient ID: Danielle [name], female   DOB: [birth year], 55 y.o.   MRN: [id number]  HPI  Continues, perhaps worsening palpitations.  No loss of conciousness.  No anxiety.  Does feel lightheaded.  Did have labs (TSH, K+)  And holter (had spells on holter and no sig arrythmias.)  Also, no caffeine or other stimulants.  Other issue is BP has been intermitantly up.  Finally, dysuria like her previous UTIs  No fever   Review of Systems     Objective:   Physical Exam Lungs clear Cardiac RRR without m No CVA tenderness    Assessment:     See problem     Plan:     See problem list

## 2011-10-21 NOTE — Assessment & Plan Note (Signed)
Up today and at home per patient report.

## 2011-10-21 NOTE — Assessment & Plan Note (Signed)
Another UTI in patient with frequent UTIs

## 2011-11-07 ENCOUNTER — Encounter: Payer: Self-pay | Admitting: Cardiology

## 2011-12-16 ENCOUNTER — Telehealth: Payer: Self-pay

## 2011-12-16 ENCOUNTER — Encounter: Payer: Self-pay | Admitting: Family Medicine

## 2011-12-16 ENCOUNTER — Ambulatory Visit (INDEPENDENT_AMBULATORY_CARE_PROVIDER_SITE_OTHER): Payer: Medicare Other | Admitting: Family Medicine

## 2011-12-16 VITALS — BP 149/97 | HR 94 | Temp 97.6°F | Ht 66.0 in | Wt 190.1 lb

## 2011-12-16 DIAGNOSIS — R300 Dysuria: Secondary | ICD-10-CM

## 2011-12-16 DIAGNOSIS — G459 Transient cerebral ischemic attack, unspecified: Secondary | ICD-10-CM

## 2011-12-16 DIAGNOSIS — R002 Palpitations: Secondary | ICD-10-CM

## 2011-12-16 DIAGNOSIS — I10 Essential (primary) hypertension: Secondary | ICD-10-CM

## 2011-12-16 LAB — POCT URINALYSIS DIPSTICK
Glucose, UA: NEGATIVE
Leukocytes, UA: NEGATIVE
Nitrite, UA: NEGATIVE
Protein, UA: NEGATIVE
Spec Grav, UA: >=1.03
Urobilinogen, UA: 0.2

## 2011-12-16 LAB — POCT UA - MICROSCOPIC ONLY

## 2011-12-16 MED ORDER — METOPROLOL SUCCINATE ER 100 MG PO TB24: 100.0000 mg | ORAL_TABLET | Freq: Every day | ORAL | Status: DC

## 2011-12-16 MED ORDER — CLOPIDOGREL BISULFATE 75 MG PO TABS: 75.0000 mg | ORAL_TABLET | Freq: Every day | ORAL | Status: DC

## 2011-12-16 MED ORDER — LISINOPRIL 5 MG PO TABS: 5.0000 mg | ORAL_TABLET | Freq: Every day | ORAL | Status: DC

## 2011-12-16 NOTE — Assessment & Plan Note (Signed)
Elevated in the setting of possible TIA.  Will be aggressive and increase Beta blocker and add ACE.

## 2011-12-16 NOTE — Progress Notes (Signed)
  Subjective:    Patient ID: Danielle [name], female    DOB: [birth year], 56 y.o.   MRN: [id number]  HPI  Very concerning symptoms.  BP running high for one week.  Has been having daily palpitations.  6 days ago had a spell lasting several hours of diplopia, right facial tingling, a bit of drooling out of the Rt. Side of mouth and confusion.  All these symptoms cleared and she is back to normal except HBP.  Has had spells which sound like TIAs dating back to 2009.  Nl echo in 2009.  Nl head CT, carotid dopplers and holter in 03/2011.    Review of Systems Denies chest pain, bleeding or SOB.  Does have epigastric discomfort.     Objective:   Physical Exam HEENT normal Neck supple without bruits Lungs, clear Cardiac RRR with 1/6 SEM Abd benign Ext no edema       Assessment & Plan:

## 2011-12-16 NOTE — Assessment & Plan Note (Signed)
Unimpressive urine but classic symptoms.  Will culture.

## 2011-12-16 NOTE — Assessment & Plan Note (Addendum)
Symptoms worrisome for another TIA - double vision suggests post circulation problem.  Had normal carotids and CT of head recently.  Also, palpitations worrisome for PAF although recent holter normal.  Given she had symptoms on ASA, will switch to plavix.

## 2011-12-16 NOTE — Patient Instructions (Signed)
I am concerned about strokes Stop your aspirin.  I have sent in a new blood thinner - Plavix You have lots of tests ordered.  I will provide results as I get them. See me in 10 days. I increased your metoprolol - you can use up what you have by taking two pills a day. I added another blood pressure medicine - lisinopril.

## 2011-12-16 NOTE — Assessment & Plan Note (Signed)
I worry that I am missing PAF, which would relate to TIA.  Will repeat holter.  She states she has daily palpitations.

## 2011-12-17 LAB — COMPLETE METABOLIC PANEL WITH GFR
ALT: 28 U/L (ref 0–35)
CO2: 24 mEq/L (ref 19–32)
Calcium: 11.2 mg/dL — ABNORMAL HIGH (ref 8.4–10.5)
Chloride: 105 mEq/L (ref 96–112)
Creat: 0.58 mg/dL (ref 0.50–1.10)
GFR, Est African American: >89 mL/min
GFR, Est Non African American: >89 mL/min
Glucose, Bld: 107 mg/dL — ABNORMAL HIGH (ref 70–99)

## 2011-12-19 ENCOUNTER — Other Ambulatory Visit: Payer: Medicare Other

## 2011-12-19 ENCOUNTER — Telehealth: Payer: Self-pay | Admitting: Family Medicine

## 2011-12-19 NOTE — Telephone Encounter (Signed)
Pt is coming in tomorrow and needs for [place] to call her insurance to get a pre-cert [phone]

## 2011-12-19 NOTE — Telephone Encounter (Signed)
MRI pre certified with [facility].  Auth # [id number].  Ileana Ladd

## 2011-12-20 ENCOUNTER — Ambulatory Visit
Admission: RE | Admit: 2011-12-20 | Discharge: 2011-12-20 | Disposition: A | Discharge status: Home / Self Care | Payer: Medicare Other | Source: Ambulatory Visit | Attending: Family Medicine | Admitting: Family Medicine

## 2011-12-20 DIAGNOSIS — G459 Transient cerebral ischemic attack, unspecified: Secondary | ICD-10-CM

## 2011-12-20 MED ORDER — GADOBENATE DIMEGLUMINE 529 MG/ML IV SOLN
20.0000 mL | Freq: Once | INTRAVENOUS | Status: AC | PRN
  Administered 2011-12-20: 20 mL via INTRAVENOUS

## 2011-12-20 NOTE — Telephone Encounter (Signed)
Patient call back to make appt. For 12/26/11 to have monitor done

## 2011-12-26 ENCOUNTER — Encounter (INDEPENDENT_AMBULATORY_CARE_PROVIDER_SITE_OTHER): Payer: Medicare Other

## 2011-12-26 DIAGNOSIS — G459 Transient cerebral ischemic attack, unspecified: Secondary | ICD-10-CM

## 2012-01-02 ENCOUNTER — Encounter: Payer: Self-pay | Admitting: Family Medicine

## 2012-01-02 DIAGNOSIS — R002 Palpitations: Secondary | ICD-10-CM

## 2012-01-02 NOTE — Progress Notes (Signed)
  Subjective:    Patient ID: Danielle [name], female    DOB: 1956-08-16, 56 y.o.   MRN: [id number]  HPI Repeat Holter shows no A fib.  RSR with only rare PAC and PVC    Review of Systems     Objective:   Physical Exam        Assessment & Plan:  Despi

## 2012-01-03 ENCOUNTER — Ambulatory Visit (HOSPITAL_COMMUNITY)
Admission: RE | Admit: 2012-01-03 | Discharge: 2012-01-03 | Disposition: A | Discharge status: Home / Self Care | Payer: Medicare Other | Source: Ambulatory Visit | Attending: Family Medicine | Admitting: Family Medicine

## 2012-01-03 DIAGNOSIS — I059 Rheumatic mitral valve disease, unspecified: Secondary | ICD-10-CM | POA: Insufficient documentation

## 2012-01-03 DIAGNOSIS — I079 Rheumatic tricuspid valve disease, unspecified: Secondary | ICD-10-CM | POA: Insufficient documentation

## 2012-01-03 DIAGNOSIS — G459 Transient cerebral ischemic attack, unspecified: Secondary | ICD-10-CM | POA: Insufficient documentation

## 2012-01-03 MED ORDER — SODIUM CHLORIDE 0.9 % IV SOLN
INTRAVENOUS | Status: DC
  Administered 2012-01-03: 500 mL via INTRAVENOUS

## 2012-01-03 NOTE — Progress Notes (Signed)
Bubble study done per protocol

## 2012-01-06 DIAGNOSIS — G459 Transient cerebral ischemic attack, unspecified: Secondary | ICD-10-CM

## 2012-01-06 HISTORY — DX: Transient cerebral ischemic attack, unspecified: G45.9

## 2012-01-11 ENCOUNTER — Ambulatory Visit: Payer: Medicare Other | Admitting: Family Medicine

## 2012-01-13 ENCOUNTER — Ambulatory Visit (INDEPENDENT_AMBULATORY_CARE_PROVIDER_SITE_OTHER): Payer: Medicare Other | Admitting: Family Medicine

## 2012-01-13 ENCOUNTER — Encounter: Payer: Self-pay | Admitting: Family Medicine

## 2012-01-13 VITALS — BP 128/85 | HR 97 | Temp 98.2°F | Ht 66.0 in | Wt 191.3 lb

## 2012-01-13 DIAGNOSIS — R7309 Other abnormal glucose: Secondary | ICD-10-CM

## 2012-01-13 DIAGNOSIS — G459 Transient cerebral ischemic attack, unspecified: Secondary | ICD-10-CM

## 2012-01-13 DIAGNOSIS — I10 Essential (primary) hypertension: Secondary | ICD-10-CM

## 2012-01-13 LAB — BASIC METABOLIC PANEL
BUN: 14 mg/dL (ref 6–23)
CO2: 27 mEq/L (ref 19–32)
Calcium: 10.3 mg/dL (ref 8.4–10.5)
Glucose, Bld: 168 mg/dL — ABNORMAL HIGH (ref 70–99)
Potassium: 3.9 mEq/L (ref 3.5–5.3)
Sodium: 139 mEq/L (ref 135–145)

## 2012-01-13 NOTE — Patient Instructions (Signed)
All your tests are fine and point away from the diagnosis of stroke.  I don't think you need further testing.  If you would like a second opinion, I would be happy to refer you to a neurologist. Keep taking your meds Get your mammogram done. Really work on diet and exercise to stop that weight from creeping up. I will call with the results of the blood work

## 2012-01-13 NOTE — Progress Notes (Signed)
  Subjective:    Patient ID: Danielle [name], female    DOB: [birth year], 56 y.o.   MRN: [id number]  HPI  FU TIA.  See previous note.  All tests are reassuring.  No CVA seen on MRI.  Echo normal.  Repeat holter shows no a fib.  (She actually had RSR during times when she felt palpitations.) Continues to have rare events though less severe than previously.  Further hx, the facial spells are sometimes preceded by headache. Generally feels better.    Review of Systems     Objective:   Physical Exam  BP controled Cardiac RRR without m or g Lungs clear Neuro WNL      Assessment & Plan:

## 2012-01-13 NOTE — Assessment & Plan Note (Signed)
Given lack of any even small CVA on MRI, I question dx of TIA.  Does not fit well with migraine with aura.  I feel comfortable that she is not at high risk for CVA, does not need coumadin anticoag and that we can observe.  She understands and agrees.

## 2012-01-13 NOTE — Assessment & Plan Note (Signed)
Well controled on current meds 

## 2012-01-16 NOTE — Assessment & Plan Note (Signed)
BS on BMP=168.  Called and discussed.  She does not remember if she had anything to eat prior to lab work.  Hgb A1C=5.9 03/2011.  Will repeat A1C to see if she has become diabetic.

## 2012-01-16 NOTE — Progress Notes (Signed)
Addended by: Tivis Ringer on: 01/16/2012 10:51 AM   Modules accepted: Orders

## 2012-01-18 ENCOUNTER — Other Ambulatory Visit: Payer: Self-pay | Admitting: Family Medicine

## 2012-01-18 DIAGNOSIS — I10 Essential (primary) hypertension: Secondary | ICD-10-CM

## 2012-01-18 NOTE — Telephone Encounter (Signed)
Refill request

## 2012-01-19 ENCOUNTER — Other Ambulatory Visit (INDEPENDENT_AMBULATORY_CARE_PROVIDER_SITE_OTHER): Payer: Medicare Other

## 2012-01-19 DIAGNOSIS — R7309 Other abnormal glucose: Secondary | ICD-10-CM

## 2012-01-19 LAB — POCT GLYCOSYLATED HEMOGLOBIN (HGB A1C): Hemoglobin A1C: 6.1

## 2012-01-19 NOTE — Progress Notes (Signed)
A1c = 6.1% Discussed with pt that glucose value was in diabetic range, but A1c is in intermediate/prediabetic range.  Encouraged pt to watch diet carefully, limiting sweets, starches, carbs, "white" foods. Dewitt Hoes, MLS

## 2012-01-24 NOTE — Telephone Encounter (Signed)
Refill request

## 2012-01-24 NOTE — Assessment & Plan Note (Signed)
Refill

## 2012-01-30 ENCOUNTER — Encounter: Payer: Medicare Other | Admitting: Physical Medicine and Rehabilitation

## 2012-02-13 ENCOUNTER — Ambulatory Visit: Payer: Medicare Other | Admitting: Physical Medicine and Rehabilitation

## 2012-02-14 ENCOUNTER — Other Ambulatory Visit: Payer: Self-pay | Admitting: Family Medicine

## 2012-02-29 ENCOUNTER — Ambulatory Visit: Payer: Medicare Other | Admitting: Physical Medicine and Rehabilitation

## 2012-03-05 ENCOUNTER — Encounter
Payer: Medicare Other | Attending: Physical Medicine and Rehabilitation | Admitting: Physical Medicine and Rehabilitation

## 2012-03-05 ENCOUNTER — Encounter: Payer: Self-pay | Admitting: Physical Medicine and Rehabilitation

## 2012-03-05 VITALS — BP 156/81 | HR 83 | Resp 14 | Ht 65.0 in | Wt 191.0 lb

## 2012-03-05 DIAGNOSIS — M545 Low back pain, unspecified: Secondary | ICD-10-CM | POA: Insufficient documentation

## 2012-03-05 DIAGNOSIS — M542 Cervicalgia: Secondary | ICD-10-CM | POA: Insufficient documentation

## 2012-03-05 DIAGNOSIS — M79609 Pain in unspecified limb: Secondary | ICD-10-CM | POA: Insufficient documentation

## 2012-03-05 DIAGNOSIS — R209 Unspecified disturbances of skin sensation: Secondary | ICD-10-CM | POA: Insufficient documentation

## 2012-03-05 DIAGNOSIS — G8929 Other chronic pain: Secondary | ICD-10-CM | POA: Insufficient documentation

## 2012-03-05 DIAGNOSIS — IMO0002 Reserved for concepts with insufficient information to code with codable children: Secondary | ICD-10-CM

## 2012-03-05 DIAGNOSIS — M5382 Other specified dorsopathies, cervical region: Secondary | ICD-10-CM

## 2012-03-05 NOTE — Progress Notes (Addendum)
Subjective:    Patient ID: Danielle [name], female    DOB: [birth year], 56 y.o.   MRN: [id number]  HPI  Patient is a 56 year old woman who has a long history of low back pain and her predominantly right lower extremity pain. 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 She underwent laminectomy twice, and subsequently 8 more lumbar surgeries for low back and leg pain.  She also tells me she has had spinal cord stimulator placed one time in 1999 or 2000 and she underwent another trial in 2008, Dr. Ethelene Hal. She reports to me that neither trialed really helped her much with her back or leg pain.  She has lost track of the number of sacroiliac joint injections, and right lower extremity nerve blocks that she's had. She also tells me she's undergone facet injections and epidural steroid injections.  She's had no physical therapy in the last year. Prior to that she tells me she's had years and years of therapy.  She has trialed Lyrica Neurontin Prozac Cymbalta and amitriptyline Wellbutrin, topamax. She tells me there been so many she can't remember them all.  She has also seen Dr. Vear Clock in the past.  She tells me he told her there was nothing he could really do for her other than pain medication.  She says she just did without it for years.  She did not like being so sleepy all the time.  Some mild bladder incontinence with coughing and sneezing since hystorectomy.   She reports some persistent numbness and tingling especially in the lateral right lower leg to the lateral right foot.  She reports that she typically has 2 or 3 good days a week on the average. She also notes 4-5 bad days a week.  Everyday she walks a mile are listed how she feels. Some days she does need a break this task up because she cannot do at all and one session. Typically she breaks her mile into 3 or 4 sessions a day.  She denies depression at this  time.    Pain Inventory Average Pain 7 Pain Right Now 6 My pain is burning, stabbing and tingling  In the last 24 hours, has pain interfered with the following? General activity 5 Relation with others 0 Enjoyment of life 0 What TIME of day is your pain at its worst? all day Sleep (in general) Fair  Pain is worse with: walking, bending, sitting, inactivity, standing and some activites Pain improves with: medication Relief from Meds: 5  Mobility walk without assistance use a cane how many minutes can you walk? off and on ability to climb steps?  yes do you drive?  yes  Function disabled: date disabled 56 I need assistance with the following:  household duties and shopping  Neuro/Psych bladder control problems weakness numbness tingling trouble walking dizziness  Prior Studies CT/MRI  Physicians involved in your care Orthopedist Dr Otelia Sergeant        Review of Systems  Constitutional: Negative.   HENT: Negative.   Eyes: Negative.   Respiratory: Negative.   Cardiovascular: Negative.   Gastrointestinal: Negative.   Genitourinary: Positive for difficulty urinating.  Musculoskeletal: Negative.   Skin: Negative.   Neurological: Positive for numbness.  Hematological: Negative.   Psychiatric/Behavioral: Negative.        Objective:   Physical Exam The patient is a well-developed mildly obese woman who does not appear in any  distress.  She is oriented x3 her speech is clear her affect is bright and alert cooperative and pleasant  Cranial nursing coordination are grossly intact  Reflexes are intact in the upper extremities 2+, lower extremities 1+ patellar tendon and Achilles tendons bilaterally. No abnormal tone clonus or tremors are noted  Sensory exam with light touch and pinprick reveals decreased sensation over left C5, C6 dermatomes and right C6 dermatomes.  Decreased sensation noted along right lateral leg and foot.  Manual muscle testing of  upper and lower extremities does not reveal focal deficit  This also includes examination of hand musculature including thenar musculature.  She also has good strength 5/over 5 bilateral shoulder abductors, external rotators, biceps triceps brachioradialis and finger flexors and intrinsics.  Straight leg raise provokes back pain bilaterally.  She exits a chair chair with the use of upper extremities  Gait is stable with short stride length slightly uneven gait is noted, decreased pushoff on right.  Cervical range of motion is fairly well preserve but she reports discomfort with extension and rotation to the right.  As well preserve shoulder range of motion with abduction as well as internal and external rotation  Lumbar motion is limited in all planes.  Well healed lumbar surgical scar noted.       Assessment & Plan:  1. Chronic low back pain  2. Chronic Right leg pain  3. Hand numbness and tingling intermittently,  last of arms was 8-10 years ago.   4. Neck pain in last 2 months had recent MRI of neck which was done at [facility] 3/13.  Results are not available to me today.  This patient has seen multiple orthopedic, physiatrists and pain management specialists over many years.  She is not interested in invasive means, more physical therapy, or medications to help manage her pain at this time. She has multiple braces and splints. She has had multiple spine injections including facet injections, epidural steroid injections, sacroiliac joint injections and in [facility] she also underwent multiple Botox injections to her lumbar spine as well.  She is able to walk a mile each day and engage with her granddaughter. She has some good days and some bad days. She has learned to pace herself and break her tasks up into manageable blocks.   Her newest problem is bilateral hand numbness and tingling. This may be related to her neck/cervical spondylosis.Can not rule out  carpal tunnel involvement however.  At this time I do not have much more to add other than considering electrodiagnostic studies to evaluate for carpal tunnel and now go ahead and set these up.   Will set up for EMG/NCV to eval for CTS bilateral Upper extremities.  His is comfortable with this plan currently I've reviewed the above with her and she agrees.

## 2012-03-05 NOTE — Progress Notes (Signed)
Addended by: Doreene Eland on: 03/05/2012 02:09 PM   Modules accepted: Orders

## 2012-03-05 NOTE — Patient Instructions (Signed)
I will set you up for electrodiagnostic studies of bilateral upper extremities.

## 2012-03-06 ENCOUNTER — Ambulatory Visit (HOSPITAL_COMMUNITY)
Admission: RE | Admit: 2012-03-06 | Discharge: 2012-03-06 | Disposition: A | Discharge status: Home / Self Care | Payer: Medicare Other | Source: Ambulatory Visit | Attending: Family Medicine | Admitting: Family Medicine

## 2012-03-06 ENCOUNTER — Encounter: Payer: Self-pay | Admitting: Family Medicine

## 2012-03-06 ENCOUNTER — Ambulatory Visit (INDEPENDENT_AMBULATORY_CARE_PROVIDER_SITE_OTHER): Payer: Medicare Other | Admitting: Family Medicine

## 2012-03-06 VITALS — BP 166/100 | HR 97 | Temp 98.5°F | Ht 65.0 in | Wt 191.0 lb

## 2012-03-06 DIAGNOSIS — G459 Transient cerebral ischemic attack, unspecified: Secondary | ICD-10-CM

## 2012-03-06 DIAGNOSIS — R079 Chest pain, unspecified: Secondary | ICD-10-CM | POA: Insufficient documentation

## 2012-03-06 DIAGNOSIS — I10 Essential (primary) hypertension: Secondary | ICD-10-CM

## 2012-03-06 MED ORDER — LISINOPRIL 5 MG PO TABS: 10.0000 mg | ORAL_TABLET | Freq: Every day | ORAL | Status: DC

## 2012-03-06 MED ORDER — HYDROCHLOROTHIAZIDE 12.5 MG PO CAPS: 25.0000 mg | ORAL_CAPSULE | Freq: Every day | ORAL | Status: DC

## 2012-03-06 NOTE — Assessment & Plan Note (Signed)
Elevated at this time increase patient's lisinopril and hydrochlorothiazide. Followup in 3 days.

## 2012-03-06 NOTE — Patient Instructions (Addendum)
It is nice to meet you. I have increased to of your blood pressure medicines. Take 2 pills daily of the lisinopril and the hydrochlorothiazide. I am also giving you set up for a stress test. I will also have you see a cardiologist. I when you to come back on Friday to see me. If the chest pain gets worse at any point in his not responding to nitroglycerin please go to the emergency department. I will see Friday

## 2012-03-06 NOTE — Assessment & Plan Note (Addendum)
Patient gives a history of potential exertional chest pain relieved with nitroglycerin and rest associated with nausea substernally that radiated to the back. At this time patient's pain is very minimal and seems to be resolving. Discussed at length with Dr. Leveda Anna patient's primary care provider. Even though still reduces potential cause for her being cardiac pain, patient seems very comfortable at this time. Will patient does have elevated blood pressure which might be contributing. With patient's heart rate in the 90s concern patient has been noncompliant with her beta blocker. Encourage her to take her beta blocker a regular basis as well as increase her hydrochlorothiazide and her lisinopril. The patient red flags and when to seek medical attention. Gave patient the option of being admitted for workup and patient declined. At this time will have patient see cardiologist which has been scheduled and 3 weeks' time as well as a stress test in 10 days. Patient will followup with me in 2 days and followup at primary care provider in one week. Patient's differential and chest to include unfortunately depression. Very low likelihood of PE or TIA with patient's recent workup and physical findings.

## 2012-03-06 NOTE — Progress Notes (Signed)
  Subjective:    Patient ID: Danielle [name], female    DOB: 02/12/1956, 56 y.o.   MRN: [id number]  HPI 56 year old female with a past medical history significant for hypercholesterol, hypertension, hypothyroidism, tobacco use and recent workup for potential TIA which was unremarkable here for chest pain. Onset: Started at 9 AM this morning when she was taking the dog outside patient's care was wet and it was cold. Duration: Patient states it lasted minutes was relieved with rest and nitroglycerin Location substernal radiating to the back What makes it better patient states time it better as well as nitroglycerin and rest What makes it worse patient states it seems that whenever she moved it seemed to be worse but was unable to reproduce the pain. Patient states the pain was a 9/10 and now has come down to approximately a 3/10 but still feels funny. Patient states she is having nausea but denies any sweating. Patient did call EMS this morning who did an EKG which was normal EKG done here shows significant amount of artifact from movement but otherwise essentially normal with no abnormalities. Patient recently had a TIA workup including echocardiogram and MRI which was negative. Patient also had a Holter monitor done which showed some mild PVCs but otherwise normal. Patient was put on Metroprolol and since that time has had decreased amount of palpations.   Review of Systems As stated above denies any fevers chills    Objective:   Physical Exam Vitals reviewed elevated blood pressure General: Patient seems moderately distressed Cardiovascular: Regular rate and rhythm no murmur appreciated no carotid bruits Pulmonary: Clear to auscultation bilaterally Abdomen: Bowel sounds positive nontender nondistended Chest: Patient is nontender on the sternum. Unable to reproduce the pain Extremities: Patient does have 1-2+ distal pulses    Assessment & Plan:

## 2012-03-06 NOTE — Progress Notes (Signed)
Addended by: Garen Grams F on: 03/06/2012 04:21 PM   Modules accepted: Orders

## 2012-03-09 ENCOUNTER — Encounter: Payer: Self-pay | Admitting: Family Medicine

## 2012-03-09 ENCOUNTER — Ambulatory Visit: Payer: Medicare Other | Admitting: Family Medicine

## 2012-03-09 ENCOUNTER — Ambulatory Visit (INDEPENDENT_AMBULATORY_CARE_PROVIDER_SITE_OTHER): Payer: Medicare Other | Admitting: Family Medicine

## 2012-03-09 DIAGNOSIS — R079 Chest pain, unspecified: Secondary | ICD-10-CM

## 2012-03-09 DIAGNOSIS — I10 Essential (primary) hypertension: Secondary | ICD-10-CM

## 2012-03-09 NOTE — Assessment & Plan Note (Signed)
Continue current therapy at this time. On April 30 to increase hydrochlorothiazide and lisinopril so we'll need to get a basic metabolic panel at followup. Patient knows of red flags and when to seek medical attention.

## 2012-03-09 NOTE — Patient Instructions (Signed)
Signs of leg you're doing better. Please keep your appointment for the stress test and cardiology visit. Probably should followup with Dr. Leveda Anna in 2 weeks to recheck her blood pressure and draw some labs.

## 2012-03-09 NOTE — Assessment & Plan Note (Signed)
Resolved at this time and does not seem to be stable angina at this point hopefully it's more likely atypical chest pain. Agent though with her risk factors should still have a full cardiology workup. We'll continue with the stress test and cardiology consult. Patient will followup with primary care provider in 2 weeks. Recheck blood pressure at that time. Patient also probably needs a basic metabolic panel at followup.

## 2012-03-09 NOTE — Progress Notes (Signed)
  Subjective:    Patient ID: Danielle [name], female    DOB: [birth year], 56 y.o.   MRN: [id number]  HPI Patient is here for followup for chest pain and hypertension.  Patient states that her chest pain resolved yesterday midday. Patient states that she appears completely normal at this point and feels great. She does not know what caused her to have the pain but is glad it gone. Patient states that now with walking everything she's not having as much shortness of breath and does not feel any chest discomfort. She feels like she is back at her baseline. For further information please see note of April 30 appointment. Patient is set up for a stress test as well as a cardiologist consult.  Hypertension Blood pressure at home: Not checking Blood pressure today: 140/92  Taking Meds: We increased her hydrochlorothiazide and lisinopril at last visit but only approximately 48 hours ago Side effects: No ROS: Denies headache visual changes nausea, vomiting, chest pain or abdominal pain or shortness of breath.    Review of Systems Denies fevers chills abdominal pain nausea vomiting diarrhea constipation    Objective:   Physical Exam Vitals reviewed  General: No apparent distress Cardiovascular: Regular rate and rhythm no murmur appreciated no carotid bruits Pulmonary: Clear to auscultation bilaterally Abdomen: Bowel sounds positive nontender nondistended Chest: Patient is nontender on the sternum. Unable to reproduce the pain Extremities: Patient does have 1-2+ distal pulses     Assessment & Plan:

## 2012-03-14 ENCOUNTER — Ambulatory Visit (HOSPITAL_COMMUNITY): Payer: Medicare Other | Attending: Family Medicine | Admitting: Radiology

## 2012-03-14 DIAGNOSIS — R0602 Shortness of breath: Secondary | ICD-10-CM

## 2012-03-14 DIAGNOSIS — Z8673 Personal history of transient ischemic attack (TIA), and cerebral infarction without residual deficits: Secondary | ICD-10-CM | POA: Insufficient documentation

## 2012-03-14 DIAGNOSIS — R110 Nausea: Secondary | ICD-10-CM | POA: Insufficient documentation

## 2012-03-14 DIAGNOSIS — E785 Hyperlipidemia, unspecified: Secondary | ICD-10-CM | POA: Insufficient documentation

## 2012-03-14 DIAGNOSIS — R079 Chest pain, unspecified: Secondary | ICD-10-CM | POA: Insufficient documentation

## 2012-03-14 DIAGNOSIS — R5383 Other fatigue: Secondary | ICD-10-CM | POA: Insufficient documentation

## 2012-03-14 DIAGNOSIS — F17200 Nicotine dependence, unspecified, uncomplicated: Secondary | ICD-10-CM | POA: Insufficient documentation

## 2012-03-14 DIAGNOSIS — R61 Generalized hyperhidrosis: Secondary | ICD-10-CM | POA: Insufficient documentation

## 2012-03-14 DIAGNOSIS — Z8249 Family history of ischemic heart disease and other diseases of the circulatory system: Secondary | ICD-10-CM | POA: Insufficient documentation

## 2012-03-14 DIAGNOSIS — R002 Palpitations: Secondary | ICD-10-CM | POA: Insufficient documentation

## 2012-03-14 DIAGNOSIS — R5381 Other malaise: Secondary | ICD-10-CM | POA: Insufficient documentation

## 2012-03-14 DIAGNOSIS — I10 Essential (primary) hypertension: Secondary | ICD-10-CM | POA: Insufficient documentation

## 2012-03-14 MED ORDER — TECHNETIUM TC 99M TETROFOSMIN IV KIT
30.0000 | PACK | Freq: Once | INTRAVENOUS | Status: AC | PRN
  Administered 2012-03-14: 30 via INTRAVENOUS

## 2012-03-14 MED ORDER — REGADENOSON 0.4 MG/5ML IV SOLN
0.4000 mg | Freq: Once | INTRAVENOUS | Status: AC
  Administered 2012-03-14: 0.4 mg via INTRAVENOUS

## 2012-03-14 MED ORDER — AMINOPHYLLINE 25 MG/ML IV SOLN
50.0000 mg | Freq: Once | INTRAVENOUS | Status: AC
  Administered 2012-03-14: 50 mg via INTRAVENOUS

## 2012-03-14 MED ORDER — TECHNETIUM TC 99M TETROFOSMIN IV KIT
10.0000 | PACK | Freq: Once | INTRAVENOUS | Status: AC | PRN
  Administered 2012-03-14: 10 via INTRAVENOUS

## 2012-03-14 MED ORDER — AMINOPHYLLINE 25 MG/ML IV SOLN
75.0000 mg | Freq: Once | INTRAVENOUS | Status: AC
  Administered 2012-03-14: 75 mg via INTRAVENOUS

## 2012-03-14 NOTE — Progress Notes (Signed)
[facility] SITE 3 NUCLEAR MED [address] [place] [address] [phone]  Cardiology Nuclear Med Study  Danielle [name] is a 56 y.o. female     MRN : [id number]     DOB: [birth year]  Procedure Date: 03/14/2012  Nuclear Med Background Indication for Stress Test:  Evaluation for Ischemia History:  03/2007 MPS: NL EF: 58%, MC, 05/2008 Heart Cath: NL EF: 60%, 01/03/2012 ECHO: 60-65% Cardiac Risk Factors: Family History - CAD, Hypertension, Lipids, Smoker and TIA  Symptoms:  Chest Pain, Diaphoresis, Fatigue, Nausea and Palpitations   Nuclear Pre-Procedure Caffeine/Decaff Intake:  None> 12 hrs NPO After: 9:00pm   Lungs:  clear O2 Sat: 96% on room air. IV 0.9% NS with Angio Cath:  22g  IV Site: R Forearm x 1, tolerated well IV Started by:  Irean Hong, RN  Chest Size (in):  36 Cup Size: C  Height: 5\' 5"  (1.651 m)  Weight:  188 lb (85.276 kg)  BMI:  Body mass index is 31.28 kg/(m^2). Tech Comments:Toprol as usual.  This patient was a walking Lexiscan. She did fine, had some sob and nausea, at the end of her recovery period she had some dry heaving. She then stated that she felt better. She sat outside my door and then went to the bathroom to vomit. She was given IV Aminophylline 75 mg for relief. She started that she felt a little better. I started with another patient. I checked on her a few minutes later and she looked very bad and stated that she felt very bad. I had Cleon Gustin given another 50 mg IV Aminophylline. She took the patient in another room for further evaluation and another EKG. Patsy advised that after about 10 minutes the patient stated that she felt great. All her symptoms were gone.     Nuclear Med Study 1 or 2 day study: 1 day  Stress Test Type:  Treadmill/Lexiscan  Reading MD: Charlton Haws, MD  Order Authorizing Provider:  Doralee Albino, MD  Resting Radionuclide: Technetium 62m Tetrofosmin  Resting Radionuclide Dose: 11.0 mCi   Stress  Radionuclide:  Technetium 42m Tetrofosmin  Stress Radionuclide Dose: 32.9 mCi           Stress Protocol Rest HR: 85 Stress HR: 123  Rest BP: 121/84 Stress BP: 158/93  Exercise Time (min): n/a METS: n/a   Predicted Max HR: 165 bpm % Max HR: 74.55 bpm Rate Pressure Product: 69629   Dose of Adenosine (mg):  n/a Dose of Lexiscan: 0.4 mg  Dose of Atropine (mg): n/a Dose of Dobutamine: n/a mcg/kg/min (at max HR)  Stress Test Technologist: Milana Na, EMT-P  Nuclear Technologist:  Domenic Polite, CNMT     Rest Procedure:  Myocardial perfusion imaging was performed at rest 45 minutes following the intravenous administration of Technetium 53m Tetrofosmin. Rest ECG: NSR - Normal EKG  Stress Procedure:  The patient received IV Lexiscan 0.4 mg over 15-seconds with concurrent low level exercise and then Technetium 33m Tetrofosmin was injected at 30-seconds while the patient continued walking one more minute. There were non specific changes with Lexiscan. Quantitative spect images were obtained after a 45-minute delay. Stress ECG: No significant change from baseline ECG  QPS Raw Data Images:  Normal; no motion artifact; normal heart/lung ratio. Stress Images:  Normal homogeneous uptake in all areas of the myocardium. Rest Images:  Normal homogeneous uptake in all areas of the myocardium. Subtraction (SDS):  Normal Transient Ischemic Dilatation (Normal <1.22):  1.12  Lung/Heart Ratio (Normal <0.45):  0.29  Quantitative Gated Spect Images QGS EDV:  65 ml QGS ESV:  29 ml  Impression Exercise Capacity:  Lexiscan with low level exercise. BP Response:  Normal blood pressure response. Clinical Symptoms:  No significant symptoms noted. ECG Impression:  No significant ST segment change suggestive of ischemia. Comparison with Prior Nuclear Study: No images to compare  Overall Impression:  Normal stress nuclear study.  LV Ejection Fraction: 56%.  LV Wall Motion:  NL LV Function; NL Wall  Motion   Charlton Haws

## 2012-03-23 ENCOUNTER — Encounter: Payer: Self-pay | Admitting: Cardiovascular Disease

## 2012-03-23 ENCOUNTER — Ambulatory Visit (INDEPENDENT_AMBULATORY_CARE_PROVIDER_SITE_OTHER): Payer: Medicare Other | Admitting: Cardiovascular Disease

## 2012-03-23 VITALS — BP 141/88 | HR 89 | Ht 66.0 in | Wt 192.0 lb

## 2012-03-23 DIAGNOSIS — E7800 Pure hypercholesterolemia, unspecified: Secondary | ICD-10-CM

## 2012-03-23 DIAGNOSIS — G459 Transient cerebral ischemic attack, unspecified: Secondary | ICD-10-CM

## 2012-03-23 DIAGNOSIS — I10 Essential (primary) hypertension: Secondary | ICD-10-CM

## 2012-03-23 DIAGNOSIS — R079 Chest pain, unspecified: Secondary | ICD-10-CM

## 2012-03-23 DIAGNOSIS — R002 Palpitations: Secondary | ICD-10-CM

## 2012-03-23 NOTE — Assessment & Plan Note (Signed)
Benign no evidence of PAF Observe

## 2012-03-23 NOTE — Assessment & Plan Note (Signed)
Well controlled.  Continue current medications and low sodium Dash type diet.    

## 2012-03-23 NOTE — Progress Notes (Signed)
Patient ID: Danielle [name], female   DOB: 08/01/1956, 56 y.o.   MRN: [id number] Shalva is seen today in F/U for SSCP. Negative w/u in 2011   It is atypical and associated wit headache, numbness in the left arm and generalized leg weakness. She had a totally normal heart cath in July of 2009. Primary ordered myovue which I reviewed.  Normal with EF 56% done 03/14/12  . I reassured her and dont think this is related to her heart. She had her gallblader removed before . I suspect her symptoms are musculoskeletal  She has had ? TIA;s in past with negative holter for PAF and negative bubble study  Cardiovascular Risk History:  Positive major cardiovascular risk factors include hyperlipidemia, hypertension, and family history for ischemic heart disease (males less than 56 years old). Negative major cardiovascular risk factors include female age less than 65 years old, no history of diabetes, and non-tobacco-user status.  Positive history for target organ damage include prior stroke (or TIA). Further assessment for target organ damage reveals no history of ASHD, cardiac end-organ damage (CHF/LVH), peripheral vascular disease, renal insufficiency, or hypertensive retinopathy.   ROS: Denies fever, malais, weight loss, blurry vision, decreased visual acuity, cough, sputum, SOB, hemoptysis, pleuritic pain, palpitaitons, heartburn, abdominal pain, melena, lower extremity edema, claudication, or rash.  All other systems reviewed and negative  General: Affect appropriate Healthy:  appears stated age HEENT: normal Neck supple with no adenopathy JVP normal no bruits no thyromegaly Lungs clear with no wheezing and good diaphragmatic motion Heart:  S1/S2 soft SEMmurmur, no rub, gallop or click PMI normal Abdomen: benighn, BS positve, no tenderness, no AAA no bruit.  No HSM or HJR Distal pulses intact with no bruits No edema Neuro non-focal Skin warm and dry No muscular weakness   Current Outpatient Prescriptions    Medication Sig Dispense Refill  . clopidogrel (PLAVIX) 75 MG tablet Take 1 tablet (75 mg total) by mouth daily.  90 tablet  3  . hydrochlorothiazide (MICROZIDE) 12.5 MG capsule Take 2 capsules (25 mg total) by mouth daily.  60 capsule  5  . KLOR-CON M20 20 MEQ tablet TAKE 1 TABLET BY MOUTH TWICE A DAY  90 tablet  3  . levothyroxine (SYNTHROID, LEVOTHROID) 175 MCG tablet Take 1 tablet (175 mcg total) by mouth daily.  31 tablet  12  . lisinopril (PRINIVIL,ZESTRIL) 5 MG tablet Take 2 tablets (10 mg total) by mouth daily.  180 tablet  3  . metoprolol succinate (TOPROL-XL) 100 MG 24 hr tablet Take 1 tablet (100 mg total) by mouth daily. Take with or immediately following a meal.  90 tablet  3  . omeprazole (PRILOSEC) 20 MG capsule TAKE ONE CAPSULE TWICE A DAY  60 capsule  4  . simvastatin (ZOCOR) 40 MG tablet Take 1 tablet (40 mg total) by mouth every evening.  90 tablet  3    Allergies  Cephalexin; Codeine; and Codeine phosphate  Electrocardiogram:  Assessment and Plan

## 2012-03-23 NOTE — Assessment & Plan Note (Signed)
Atypical normal myovue observe

## 2012-03-23 NOTE — Assessment & Plan Note (Signed)
Cholesterol is at goal.  Continue current dose of statin and diet Rx.  No myalgias or side effects.  F/U  LFT's in 6 months. Lab Results  Component Value Date   LDLCALC  Value: 104        Total Cholesterol/HDL:CHD Risk Coronary Heart Disease Risk Table                     Men   Women  1/2 Average Risk   3.4   3.3  Average Risk       5.0   4.4  2 X Average Risk   9.6   7.1  3 X Average Risk  23.4   11.0        Use the calculated Patient Ratio above and the CHD Risk Table to determine the patient's CHD Risk.        ATP III CLASSIFICATION (LDL):  <100     mg/dL   Optimal  161-096  mg/dL   Near or Above                    Optimal  130-159  mg/dL   Borderline  045-409  mg/dL   High  >811     mg/dL   Very High* 07/21/7828

## 2012-03-23 NOTE — Assessment & Plan Note (Signed)
NO evidence of PAF or carotid disease.  F/U neuro ASA

## 2012-03-30 ENCOUNTER — Ambulatory Visit (INDEPENDENT_AMBULATORY_CARE_PROVIDER_SITE_OTHER): Payer: Medicare Other | Admitting: Family Medicine

## 2012-03-30 ENCOUNTER — Encounter: Payer: Self-pay | Admitting: Family Medicine

## 2012-03-30 VITALS — BP 137/88 | HR 105 | Temp 98.4°F | Ht 66.0 in | Wt 189.7 lb

## 2012-03-30 DIAGNOSIS — R7309 Other abnormal glucose: Secondary | ICD-10-CM

## 2012-03-30 DIAGNOSIS — I10 Essential (primary) hypertension: Secondary | ICD-10-CM

## 2012-03-30 DIAGNOSIS — R079 Chest pain, unspecified: Secondary | ICD-10-CM

## 2012-03-30 LAB — BASIC METABOLIC PANEL
BUN: 16 mg/dL (ref 6–23)
Calcium: 11.2 mg/dL — ABNORMAL HIGH (ref 8.4–10.5)
Chloride: 100 mEq/L (ref 96–112)
Creat: 0.72 mg/dL (ref 0.50–1.10)

## 2012-03-30 NOTE — Assessment & Plan Note (Signed)
Encourage wt loss, recheck A1C

## 2012-03-30 NOTE — Patient Instructions (Signed)
Get your mammogram done Stay on all your same meds I will call with blood work results. See me in three months.

## 2012-03-30 NOTE — Progress Notes (Signed)
  Subjective:    Patient ID: Danielle [name], female    DOB: [birth year], 56 y.o.   MRN: [id number]  HPI  Chest pain has resolved as mysteriously as it began.  Work up is negative and complete.  (I reviewed Dr. Fabio Bering note)  No palpitations, no other TIA symptoms.  She is aware she is overdue for a mammogram and will schedule on her own.  Finally, she is aware she is pre diabetic.  Good that she has lost 3 lbs since last visit.  Encourage continued wt loss    Review of Systems     Objective:   Physical Exam Cardiac RRR without m or g Lungs clear Ext no edema       Assessment & Plan:

## 2012-03-30 NOTE — Assessment & Plan Note (Signed)
Well controled, check BMP

## 2012-03-30 NOTE — Assessment & Plan Note (Signed)
Symptoms have resolved at present.  No further WU planned.  Cont current meds

## 2012-04-04 ENCOUNTER — Encounter: Payer: Medicare Other | Admitting: Physical Medicine and Rehabilitation

## 2012-04-16 ENCOUNTER — Ambulatory Visit (INDEPENDENT_AMBULATORY_CARE_PROVIDER_SITE_OTHER): Payer: Medicare Other | Admitting: Family Medicine

## 2012-04-16 ENCOUNTER — Encounter: Payer: Self-pay | Admitting: Family Medicine

## 2012-04-16 VITALS — BP 136/85 | HR 98 | Temp 98.3°F | Ht 66.0 in | Wt 191.0 lb

## 2012-04-16 DIAGNOSIS — T148 Other injury of unspecified body region: Secondary | ICD-10-CM

## 2012-04-16 DIAGNOSIS — T148XXA Other injury of unspecified body region, initial encounter: Secondary | ICD-10-CM

## 2012-04-16 DIAGNOSIS — W57XXXA Bitten or stung by nonvenomous insect and other nonvenomous arthropods, initial encounter: Secondary | ICD-10-CM | POA: Insufficient documentation

## 2012-04-16 MED ORDER — MOMETASONE FUROATE 0.1 % EX CREA: TOPICAL_CREAM | Freq: Every day | CUTANEOUS | Status: DC

## 2012-04-16 MED ORDER — CETIRIZINE HCL 10 MG PO TABS: 10.0000 mg | ORAL_TABLET | Freq: Every day | ORAL | Status: DC

## 2012-04-16 NOTE — Progress Notes (Signed)
  Subjective:    Patient ID: Danielle [name], female    DOB: [birth year], 56 y.o.   MRN: [id number]  HPI Acute visit for bug bite on her right forearm.  Working in her yard yesterday. In the evening noticed a "head" and started having swelling and redness. Tried to ice but does not think it helped. Took some Benadryl. This morning, felt bite was enlarging and noticed a couple of other hives on other arm.   Review of Systems No weakness, tingling, difficulty breathing.   Past Medical History, Family History, Social History, Allergies, and Medications reviewed. Significant for history of tobacco use, history of TIA on Plavix     Objective:   Physical Exam GEN: NAD; Caucasian PSYCH: engaged, appropriate, very pleasant, alert and oriented PULM: NI WOB SKIN:    3 x 5 cm area of swelling and erythema right forearm; mild tenderness with visible pinpoint head; no bruising   Two 2 x 2 cm areas of swelling and erythema on underside of left arm; no head visible    Assessment & Plan:

## 2012-04-16 NOTE — Patient Instructions (Signed)
Use the cream once or twice a day on the red areas.  Take the anti-histamine daily.  Bug bites should improve in 3-10 days.   If you have difficulty breathing or the rash starts to get worse (pus, significantly largely) then go to the ED or return to clinic.

## 2012-04-16 NOTE — Assessment & Plan Note (Signed)
Hive on right forearm and then later two on underside of left arm.  Suspect bug bite from patient's working outside yesterday.  Unlikely due to poisonous spider. 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  Tx with steroid cream and anti-histamine.  Given indications to go to ED/return to clinic.

## 2012-06-01 ENCOUNTER — Other Ambulatory Visit: Payer: Self-pay | Admitting: Family Medicine

## 2012-06-12 ENCOUNTER — Other Ambulatory Visit: Payer: Self-pay | Admitting: Family Medicine

## 2012-06-12 DIAGNOSIS — Z1231 Encounter for screening mammogram for malignant neoplasm of breast: Secondary | ICD-10-CM

## 2012-06-20 ENCOUNTER — Ambulatory Visit (INDEPENDENT_AMBULATORY_CARE_PROVIDER_SITE_OTHER): Payer: Medicare Other | Admitting: Family Medicine

## 2012-06-20 ENCOUNTER — Encounter: Payer: Self-pay | Admitting: Family Medicine

## 2012-06-20 VITALS — BP 132/88 | HR 76 | Ht 66.0 in | Wt 199.2 lb

## 2012-06-20 DIAGNOSIS — N390 Urinary tract infection, site not specified: Secondary | ICD-10-CM

## 2012-06-20 DIAGNOSIS — I10 Essential (primary) hypertension: Secondary | ICD-10-CM

## 2012-06-20 DIAGNOSIS — E039 Hypothyroidism, unspecified: Secondary | ICD-10-CM

## 2012-06-20 DIAGNOSIS — E7800 Pure hypercholesterolemia, unspecified: Secondary | ICD-10-CM

## 2012-06-20 DIAGNOSIS — Z87891 Personal history of nicotine dependence: Secondary | ICD-10-CM

## 2012-06-20 DIAGNOSIS — Z0000 Encounter for general adult medical examination without abnormal findings: Secondary | ICD-10-CM

## 2012-06-20 DIAGNOSIS — M722 Plantar fascial fibromatosis: Secondary | ICD-10-CM

## 2012-06-20 DIAGNOSIS — N23 Unspecified renal colic: Secondary | ICD-10-CM

## 2012-06-20 DIAGNOSIS — R7309 Other abnormal glucose: Secondary | ICD-10-CM

## 2012-06-20 LAB — POCT URINALYSIS DIPSTICK
Bilirubin, UA: NEGATIVE
Blood, UA: NEGATIVE
Nitrite, UA: NEGATIVE
Protein, UA: NEGATIVE
Urobilinogen, UA: 0.2
pH, UA: 7.5

## 2012-06-20 LAB — POCT UA - MICROSCOPIC ONLY

## 2012-06-20 MED ORDER — CIPROFLOXACIN HCL 500 MG PO TABS: 500.0000 mg | ORAL_TABLET | Freq: Two times a day (BID) | ORAL | Status: AC

## 2012-06-20 NOTE — Assessment & Plan Note (Signed)
Recent wt gain and Hgb a1C one month ago at urgent care is worrisome.  She knows she is on the verge of diabetes.

## 2012-06-20 NOTE — Patient Instructions (Addendum)
The Hgb A1C test is to see if you have diabetes and how well controled it is. Normal, non diabetics have an A1C of less than 6.0 Readings between 6-7 generally mean you have diabetes which is well controled.   Readings above 7 mean we need to work on better control. Google Plantar fasciitis.  Remember stretching, massage and weight loss.   Great work on stopping smoking. You have another UTI I will do a regular X-ray to see if kidney stones.

## 2012-06-20 NOTE — Assessment & Plan Note (Addendum)
Will Rx.  Has recurrent infections.  Also a history of renal stones.  Check KUB as a screening test.

## 2012-06-20 NOTE — Progress Notes (Signed)
  Subjective:    Patient ID: Danielle [name], female    DOB: 1955/11/24, 56 y.o.   MRN: [id number]  HPI Physical plus complaints.   HPDP generally up to date.  Needs mammo but already scheduled for 06/26/12 Frequent UTIs.  No fever but vague bilateral "kidney pain." Frustrated by weight gain.  Eating OK.  Not exercising.  Exercise limited due to back pain and foot pain. Bilateral foot pain - plantar surface.     Review of SystemsDenies CP, SOB, change in bowel or bladder, bleeding, leg swelling.     Objective:   Physical ExamHEENT nl Neck supple  Lungs clear Cardiac RRR without m or g Abd benign Ext trace bilateral edema Exam consistent with bilateral plantar fasciitis.   Neuro motor and sensory grossly intact.     Assessment & Plan:

## 2012-06-21 LAB — COMPLETE METABOLIC PANEL WITH GFR
ALT: 32 U/L (ref 0–35)
AST: 23 U/L (ref 0–37)
CO2: 29 mEq/L (ref 19–32)
Calcium: 11.2 mg/dL — ABNORMAL HIGH (ref 8.4–10.5)
Chloride: 103 mEq/L (ref 96–112)
GFR, Est African American: >89 mL/min
Potassium: 4.3 mEq/L (ref 3.5–5.3)
Sodium: 138 mEq/L (ref 135–145)
Total Protein: 6.8 g/dL (ref 6.0–8.3)

## 2012-06-21 LAB — LIPID PANEL
LDL Cholesterol: 95 mg/dL (ref 0–99)
Triglycerides: 195 mg/dL — ABNORMAL HIGH (ref <150)
VLDL: 39 mg/dL (ref 0–40)

## 2012-06-22 ENCOUNTER — Encounter: Payer: Self-pay | Admitting: Family Medicine

## 2012-06-22 DIAGNOSIS — Z0000 Encounter for general adult medical examination without abnormal findings: Secondary | ICD-10-CM | POA: Insufficient documentation

## 2012-06-22 DIAGNOSIS — M722 Plantar fascial fibromatosis: Secondary | ICD-10-CM | POA: Insufficient documentation

## 2012-06-22 NOTE — Assessment & Plan Note (Signed)
Well controled. 

## 2012-06-22 NOTE — Assessment & Plan Note (Signed)
Denies current pain 

## 2012-06-22 NOTE — Assessment & Plan Note (Signed)
Given exercises. 

## 2012-06-22 NOTE — Assessment & Plan Note (Signed)
Check labs 

## 2012-06-22 NOTE — Assessment & Plan Note (Signed)
Generally healthy and up to date on HPDP except mammo which she has scheduled. Weight/exercise is main issue of improvement. It is great that she quit tobacco.

## 2012-06-26 ENCOUNTER — Ambulatory Visit: Payer: Medicare Other

## 2012-06-27 ENCOUNTER — Ambulatory Visit
Admission: RE | Admit: 2012-06-27 | Discharge: 2012-06-27 | Disposition: A | Discharge status: Home / Self Care | Payer: Medicare Other | Source: Ambulatory Visit | Attending: Family Medicine | Admitting: Family Medicine

## 2012-06-27 DIAGNOSIS — N390 Urinary tract infection, site not specified: Secondary | ICD-10-CM

## 2012-06-27 DIAGNOSIS — Z1231 Encounter for screening mammogram for malignant neoplasm of breast: Secondary | ICD-10-CM

## 2012-07-25 ENCOUNTER — Encounter: Payer: Self-pay | Admitting: Family Medicine

## 2012-07-25 ENCOUNTER — Ambulatory Visit (INDEPENDENT_AMBULATORY_CARE_PROVIDER_SITE_OTHER): Payer: Medicare Other | Admitting: Family Medicine

## 2012-07-25 VITALS — BP 160/95 | HR 82 | Temp 98.0°F | Ht 66.0 in | Wt 201.7 lb

## 2012-07-25 DIAGNOSIS — Z23 Encounter for immunization: Secondary | ICD-10-CM

## 2012-07-25 DIAGNOSIS — E039 Hypothyroidism, unspecified: Secondary | ICD-10-CM

## 2012-07-25 DIAGNOSIS — R300 Dysuria: Secondary | ICD-10-CM

## 2012-07-25 DIAGNOSIS — I10 Essential (primary) hypertension: Secondary | ICD-10-CM

## 2012-07-25 LAB — POCT URINALYSIS DIPSTICK
Bilirubin, UA: NEGATIVE
Glucose, UA: NEGATIVE
Nitrite, UA: NEGATIVE
Spec Grav, UA: 1.025
Urobilinogen, UA: 0.2

## 2012-07-25 MED ORDER — LISINOPRIL 20 MG PO TABS: 20.0000 mg | ORAL_TABLET | Freq: Every day | ORAL | Status: DC

## 2012-07-25 NOTE — Progress Notes (Signed)
  Subjective:    Patient ID: Danielle [name], female    DOB: Jan 06, 1956, 56 y.o.   MRN: [id number]  HPI Several issues: Dysuria and hx of frequent UTIs  No fever.  Just started today. BP has been up.  Seems to have wide fluctuations. Had single spell of lightheadedness.  Her chronic palpitations acted up at that time Quit smoking x 2.5 months.   Review of Systems     Objective:   Physical Exam Lungs clear Cardiac RRR without m or g Abd benign       Assessment & Plan:

## 2012-07-25 NOTE — Patient Instructions (Addendum)
Your blood pressure was up.  I increased your lisinopril to 20 mg per day.  I sent a new prescription.  You can use up your old lisinopril by taking four 5 mg tabs per day. You got a flu shot today. We decided to leave your thyroid dose alone.  I will need to recheck around Feb. 2014.  Help me remember. I should see you again in one month to make sure your blood pressure is OK.

## 2012-07-26 ENCOUNTER — Telehealth: Payer: Self-pay | Admitting: *Deleted

## 2012-07-26 NOTE — Assessment & Plan Note (Signed)
Increase lisinopril.  Consider ambulatory BP monitor for labile HBP

## 2012-07-26 NOTE — Telephone Encounter (Signed)
Patient calling today due to elevated BP last night (220/139), "left jaw tingling", and could hear "heart pounding in my head."  Patient "decided to lie down and call doctor tomorrow."  Was seen in office yesterday for elevated BP and medication was increased (lisinopril 20 mg daily).  Patient did not start medication yesterday.  Picked up med from pharmacy and started new dose today.  Has had "four TIA episodes in past."  BP this afternoon---195/114.  No c/o of chest pain, headache, tingling.  Spoke with Dr. Mauricio Po (preceptor)---will have patient come to office tomorrow for BP check.  Patient to bring her monitor to visit to check for accuracy.  Patient informed to go to ED immediately for evaluation if she experiences any more facial tingling or elevated blood pressure.  Patient verbalized understanding.  Gaylene Brooks, RN

## 2012-07-26 NOTE — Assessment & Plan Note (Signed)
Reviewed mildly elevated TSH.  Will repeat in 6 months.

## 2012-07-27 ENCOUNTER — Ambulatory Visit (INDEPENDENT_AMBULATORY_CARE_PROVIDER_SITE_OTHER): Payer: Medicare Other | Admitting: *Deleted

## 2012-07-27 DIAGNOSIS — I10 Essential (primary) hypertension: Secondary | ICD-10-CM

## 2012-07-27 NOTE — Progress Notes (Signed)
Patient in early today for BP check today..  Started lisinopril as directed on 09/18. Last night BP when she checked at home was elevated at 195/117 she states. Also had some jaw pain. Denies any chest pain .  Feels well today , no jaw pain.  BP checked manually with large adult cuff. BP LA 136/90 and RA 144/90.  Pulse 80. Consulted with Dr. Earnest Bailey . Will forward message to Dr. Leveda Anna. Patient did not bring in her home BP monitor.

## 2012-07-30 NOTE — Assessment & Plan Note (Signed)
Seen communication.

## 2012-07-30 NOTE — Progress Notes (Signed)
  Subjective:    Patient ID: Danielle [name], female    DOB: 04/18/1956, 56 y.o.   MRN: [id number]  HPI Problems noted.  Called patient and LM to call back if problems persist.    Review of Systems     Objective:   Physical Exam        Assessment & Plan:

## 2012-08-02 ENCOUNTER — Other Ambulatory Visit: Payer: Self-pay | Admitting: Family Medicine

## 2012-08-18 ENCOUNTER — Other Ambulatory Visit: Payer: Self-pay | Admitting: Family Medicine

## 2013-04-18 ENCOUNTER — Encounter: Payer: Self-pay | Admitting: Cardiovascular Disease

## 2013-05-08 ENCOUNTER — Encounter: Payer: Self-pay | Admitting: Neurology

## 2013-05-08 ENCOUNTER — Ambulatory Visit (INDEPENDENT_AMBULATORY_CARE_PROVIDER_SITE_OTHER): Payer: Medicare Other | Admitting: Neurology

## 2013-05-08 VITALS — BP 134/89 | HR 88 | Ht 65.5 in | Wt 196.0 lb

## 2013-05-08 DIAGNOSIS — M79609 Pain in unspecified limb: Secondary | ICD-10-CM | POA: Insufficient documentation

## 2013-05-08 NOTE — Progress Notes (Signed)
Reason for visit: Arm pain  Danielle [name] is a 57 y.o. female  History of present illness:  Danielle [name] is a 57 year old white female with a history of right arm discomfort that began around March of 2013. The patient indicates that she fell, and bumped her head with the fall. Following that event, the patient began noting some discomfort and tingling sensations in the fourth and fifth fingers of the right hand. The patient indicates that she also has some right medial elbow discomfort, with occasional shooting pains into the right forearm. The patient also has discomfort into the right neck and right shoulder. The patient denies any alteration in the pain symptoms with head turning or neck flexion or extension. The patient has also noted some occasional shock sensations into the legs bilaterally, but she does not associate this with neck movement. The patient has had multiple lumbosacral spine surgeries, but she indicates that she has never had cervical spine surgery. The patient feels somewhat weak in the legs, and she has problems with constipation, no issues with bladder control. The patient does have some balance issues, and she will fall on occasion. The patient has had injections into the cubital tunnel on the right with transient benefit for about 2 weeks following the injection. The patient has had nerve conduction study and EMG evaluation on 2 occasions, the last study was approximately 2 months ago. These studies have not shown evidence of an ulnar neuropathy. The patient is sent to this office for an evaluation.  Past Medical History  Diagnosis Date  . Hypertension   . Glucose intolerance (pre-diabetes)   . History of tobacco abuse   . History of hiatal hernia   . Hyperthyroidism   . Tachycardia   . Stroke   . 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 Obesity   . Dyslipidemia   . Gastroesophageal reflux disease     Past Surgical History  Procedure Laterality Date  . Abdominal  hysterectomy    . Cholecystectomy    . Back surgery      10 spine surgeries since 1988.  . Tonsillectomy    . Appendectomy      Family History  Problem Relation Age of Onset  . Heart attack Father   . Cancer Father   . Diabetes Father   . Heart attack Brother   . Cancer Other   . Coronary artery disease Other   . Stroke Other   . Diabetes Other   . Hypertension Other   . Heart failure Other   . Stroke Mother   . Diabetes Mother     Social history:  reports that she has been smoking Cigarettes.  She has a 11.25 pack-year smoking history. She has never used smokeless tobacco. She reports that she does not drink alcohol or use illicit drugs.  Medications:  Current Outpatient Prescriptions on File Prior to Visit  Medication Sig Dispense Refill  . hydrochlorothiazide (MICROZIDE) 12.5 MG capsule TAKE ONE CAPSULE BY MOUTH EVERY DAY  30 capsule  5  . KLOR-CON M20 20 MEQ tablet TAKE 1 TABLET BY MOUTH TWICE A DAY  90 tablet  3  . levothyroxine (SYNTHROID, LEVOTHROID) 175 MCG tablet TAKE 1 TABLET BY MOUTH ONCE DAILY  31 tablet  8  . lisinopril (PRINIVIL,ZESTRIL) 20 MG tablet Take 1 tablet (20 mg total) by mouth daily.  90 tablet  3  . simvastatin (ZOCOR) 40 MG tablet TAKE 1 TABLET BY MOUTH EVERY EVENING  90 tablet  2  . clopidogrel (PLAVIX) 75 MG tablet Take 1 tablet (75 mg total) by mouth daily.  90 tablet  3  . metoprolol succinate (TOPROL-XL) 100 MG 24 hr tablet Take 1 tablet (100 mg total) by mouth daily. Take with or immediately following a meal.  90 tablet  3   No current facility-administered medications on file prior to visit.    Allergies:  Allergies  Allergen Reactions  . Cephalexin     REACTION: burns stomach  . Codeine     REACTION: Hives  . Codeine Phosphate     REACTION: unspecified  . Ivp Dye (Iodinated Diagnostic Agents)     ROS:  Out of a complete 14 system review of symptoms, the patient complains only of the following symptoms, and all other reviewed  systems are negative.  Palpitations of the heart Blurred vision Easy bruising Achy muscles Dizziness  Blood pressure 134/89, pulse 88, height 5' 5.5" (1.664 m), weight 196 lb (88.905 kg).  Physical Exam  General: The patient is alert and cooperative at the time of the examination. The patient is minimally to moderately obese.  Head: Pupils are equal, round, and reactive to light. Discs are flat bilaterally.  Neck: The neck is supple, no carotid bruits are noted.  Respiratory: The respiratory examination is clear.  Cardiovascular: The cardiovascular examination reveals a regular rate and rhythm, no obvious murmurs or rubs are noted.  Skin: Extremities are without significant edema. No alteration in the radial pulse is noted with the right arm with arm elevation or head turning.  Neurologic Exam  Mental status:  Cranial nerves: Facial symmetry is present. There is good sensation of the face to pinprick and soft touch bilaterally. The strength of the facial muscles and the muscles to head turning and shoulder shrug are normal bilaterally. Speech is well enunciated, no aphasia or dysarthria is noted. Extraocular movements are full. Visual fields are full.  Motor: The motor testing reveals 5 over 5 strength of all 4 extremities. Good symmetric motor tone is noted throughout. There is no evidence of weakness in the intrinsic muscles of the right hand.  Sensory: Sensory testing is intact to pinprick, soft touch, vibration sensation, and position sense on all 4 extremities. No evidence of extinction is noted.  Coordination: Cerebellar testing reveals good finger-nose-finger and heel-to-shin bilaterally.  Gait and station: Gait is associated with short shuffling steps Tandem gait is normal. Romberg is negative. No drift is seen.  Reflexes: Deep tendon reflexes are symmetric and normal bilaterally. Toes are downgoing bilaterally.   Assessment/Plan:  1. Right arm discomfort  The  patient has a distribution of pain that is in the right ulnar nerve distribution. The patient however, has not had a documented ulnar neuropathy. The patient has responded transiently to cubital tunnel steroid injections. The patient has discomfort at the medial epicondyles, but pain is not elicited with pronation of the forearm, or with pressure on the palm of the hand. The patient indicates that the right arm discomfort began following a fall associated with a bump to the head. The patient indicated that she did not hit her elbow with the fall. The patient will be set up for MRI evaluation of the cervical spine. The patient also reports electric shock sensations in the legs, possibly representing a lhermitte's phenomenon. The patient will followup in 3 or 4 months. The patient has an elbow pad, and she is to use this over the ulnar groove during the day, and invert  the pad at night to prevent full flexion of the right arm. The patient still could have a low-grade ulnar neuropathy.  Marlan Palau MD 05/08/2013 9:32 PM  [facility] [address] [place], [place] [address]  Phone [phone] Fax [phone]

## 2013-05-20 ENCOUNTER — Other Ambulatory Visit: Payer: Self-pay | Admitting: *Deleted

## 2013-05-20 MED ORDER — LEVOTHYROXINE SODIUM 175 MCG PO TABS: ORAL_TABLET | ORAL | Status: DC

## 2013-05-22 ENCOUNTER — Ambulatory Visit
Admission: RE | Admit: 2013-05-22 | Discharge: 2013-05-22 | Disposition: A | Discharge status: Home / Self Care | Payer: Self-pay | Source: Ambulatory Visit | Attending: Neurology | Admitting: Neurology

## 2013-05-22 DIAGNOSIS — M79609 Pain in unspecified limb: Secondary | ICD-10-CM

## 2013-05-25 ENCOUNTER — Telehealth: Payer: Self-pay | Admitting: Neurology

## 2013-05-25 NOTE — Telephone Encounter (Signed)
I called the patient. 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 It is possible that some of the symptoms in the arm could be related to this. There is no evidence of spinal cord compression that would explain the Lhermitte's phenomenon. I left a message. We will consider the possibility of getting an epidural steroid injection in the cervical spine to see if this helps her arm symptoms. Prior EMG and nerve conduction studies have been unremarkable. The patient will call our office if she wants to have epidural steroid injection.

## 2013-05-27 ENCOUNTER — Telehealth: Payer: Self-pay | Admitting: Neurology

## 2013-05-27 DIAGNOSIS — M47812 Spondylosis without myelopathy or radiculopathy, cervical region: Secondary | ICD-10-CM

## 2013-05-30 NOTE — Telephone Encounter (Signed)
Spoke to patient and she would like to proceed and get the epidural steroid injection.

## 2013-05-30 NOTE — Telephone Encounter (Signed)
The patient indicates that she wishes to proceed with the cervical epidural steroid injection. I will get this ordered.

## 2013-05-31 ENCOUNTER — Other Ambulatory Visit: Payer: Self-pay | Admitting: Neurology

## 2013-05-31 DIAGNOSIS — M47812 Spondylosis without myelopathy or radiculopathy, cervical region: Secondary | ICD-10-CM

## 2013-06-05 ENCOUNTER — Other Ambulatory Visit: Payer: Medicare Other

## 2013-06-11 ENCOUNTER — Other Ambulatory Visit: Payer: Self-pay

## 2013-06-11 ENCOUNTER — Ambulatory Visit
Admission: RE | Admit: 2013-06-11 | Discharge: 2013-06-11 | Disposition: A | Discharge status: Home / Self Care | Payer: Medicare Other | Source: Ambulatory Visit | Attending: Neurology | Admitting: Neurology

## 2013-06-11 VITALS — BP 139/90 | HR 94

## 2013-06-11 DIAGNOSIS — M47812 Spondylosis without myelopathy or radiculopathy, cervical region: Secondary | ICD-10-CM

## 2013-06-11 MED ORDER — IOHEXOL 300 MG/ML  SOLN: 1.0000 mL | Freq: Once | INTRAMUSCULAR | Status: AC | PRN

## 2013-06-11 MED ORDER — TRIAMCINOLONE ACETONIDE 40 MG/ML IJ SUSP (RADIOLOGY): 60.0000 mg | Freq: Once | INTRAMUSCULAR | Status: DC

## 2013-07-15 ENCOUNTER — Emergency Department (HOSPITAL_COMMUNITY)
Admission: EM | Admit: 2013-07-15 | Discharge: 2013-07-15 | Disposition: A | Discharge status: Nursing Home (Includes ICF) | Payer: Medicare Other | Source: Home / Self Care | Attending: Family Medicine | Admitting: Family Medicine

## 2013-07-15 ENCOUNTER — Encounter (HOSPITAL_COMMUNITY): Payer: Self-pay | Admitting: Nurse Practitioner

## 2013-07-15 ENCOUNTER — Other Ambulatory Visit (HOSPITAL_COMMUNITY)
Admission: RE | Admit: 2013-07-15 | Discharge: 2013-07-15 | Disposition: A | Discharge status: Home / Self Care | Payer: Medicare Other | Source: Ambulatory Visit | Attending: Family Medicine | Admitting: Family Medicine

## 2013-07-15 ENCOUNTER — Encounter (HOSPITAL_COMMUNITY): Payer: Self-pay | Admitting: Emergency Medicine

## 2013-07-15 ENCOUNTER — Emergency Department (HOSPITAL_COMMUNITY): Payer: Medicare Other

## 2013-07-15 ENCOUNTER — Emergency Department (HOSPITAL_COMMUNITY)
Admission: EM | Admit: 2013-07-15 | Discharge: 2013-07-15 | Disposition: A | Discharge status: Home / Self Care | Payer: Medicare Other | Attending: Emergency Medicine | Admitting: Emergency Medicine

## 2013-07-15 DIAGNOSIS — F17200 Nicotine dependence, unspecified, uncomplicated: Secondary | ICD-10-CM | POA: Insufficient documentation

## 2013-07-15 DIAGNOSIS — D72829 Elevated white blood cell count, unspecified: Secondary | ICD-10-CM | POA: Insufficient documentation

## 2013-07-15 DIAGNOSIS — Z881 Allergy status to other antibiotic agents status: Secondary | ICD-10-CM | POA: Insufficient documentation

## 2013-07-15 DIAGNOSIS — R000 Tachycardia, unspecified: Secondary | ICD-10-CM | POA: Insufficient documentation

## 2013-07-15 DIAGNOSIS — Z8673 Personal history of transient ischemic attack (TIA), and cerebral infarction without residual deficits: Secondary | ICD-10-CM | POA: Insufficient documentation

## 2013-07-15 DIAGNOSIS — Z113 Encounter for screening for infections with a predominantly sexual mode of transmission: Secondary | ICD-10-CM | POA: Insufficient documentation

## 2013-07-15 DIAGNOSIS — R110 Nausea: Secondary | ICD-10-CM | POA: Insufficient documentation

## 2013-07-15 DIAGNOSIS — K5792 Diverticulitis of intestine, part unspecified, without perforation or abscess without bleeding: Secondary | ICD-10-CM

## 2013-07-15 DIAGNOSIS — Z885 Allergy status to narcotic agent status: Secondary | ICD-10-CM | POA: Insufficient documentation

## 2013-07-15 DIAGNOSIS — Z79899 Other long term (current) drug therapy: Secondary | ICD-10-CM | POA: Insufficient documentation

## 2013-07-15 DIAGNOSIS — N760 Acute vaginitis: Secondary | ICD-10-CM | POA: Insufficient documentation

## 2013-07-15 DIAGNOSIS — Z8719 Personal history of other diseases of the digestive system: Secondary | ICD-10-CM | POA: Insufficient documentation

## 2013-07-15 DIAGNOSIS — I10 Essential (primary) hypertension: Secondary | ICD-10-CM | POA: Insufficient documentation

## 2013-07-15 DIAGNOSIS — E0590 Thyrotoxicosis, unspecified without thyrotoxic crisis or storm: Secondary | ICD-10-CM | POA: Insufficient documentation

## 2013-07-15 DIAGNOSIS — E785 Hyperlipidemia, unspecified: Secondary | ICD-10-CM | POA: Insufficient documentation

## 2013-07-15 DIAGNOSIS — Z7902 Long term (current) use of antithrombotics/antiplatelets: Secondary | ICD-10-CM | POA: Insufficient documentation

## 2013-07-15 DIAGNOSIS — K219 Gastro-esophageal reflux disease without esophagitis: Secondary | ICD-10-CM | POA: Insufficient documentation

## 2013-07-15 DIAGNOSIS — R109 Unspecified abdominal pain: Secondary | ICD-10-CM

## 2013-07-15 DIAGNOSIS — K5732 Diverticulitis of large intestine without perforation or abscess without bleeding: Secondary | ICD-10-CM | POA: Insufficient documentation

## 2013-07-15 DIAGNOSIS — E669 Obesity, unspecified: Secondary | ICD-10-CM | POA: Insufficient documentation

## 2013-07-15 LAB — CBC
MCV: 92.2 fL (ref 78.0–100.0)
Platelets: 294 10*3/uL (ref 150–400)
RDW: 13.2 % (ref 11.5–15.5)
WBC: 16.3 10*3/uL — ABNORMAL HIGH (ref 4.0–10.5)

## 2013-07-15 LAB — COMPREHENSIVE METABOLIC PANEL
Alkaline Phosphatase: 98 U/L (ref 39–117)
BUN: 8 mg/dL (ref 6–23)
CO2: 27 mEq/L (ref 19–32)
Chloride: 99 mEq/L (ref 96–112)
Creatinine, Ser: 0.51 mg/dL (ref 0.50–1.10)
GFR calc Af Amer: >90 mL/min (ref >90)
GFR calc non Af Amer: >90 mL/min (ref >90)
Glucose, Bld: 108 mg/dL — ABNORMAL HIGH (ref 70–99)
Potassium: 3.7 mEq/L (ref 3.5–5.1)
Total Bilirubin: 0.5 mg/dL (ref 0.3–1.2)

## 2013-07-15 LAB — POCT URINALYSIS DIP (DEVICE)
Bilirubin Urine: NEGATIVE
Glucose, UA: NEGATIVE mg/dL
Hgb urine dipstick: NEGATIVE
Specific Gravity, Urine: 1.015 (ref 1.005–1.030)
pH: 7 (ref 5.0–8.0)

## 2013-07-15 MED ORDER — CIPROFLOXACIN HCL 500 MG PO TABS: 500.0000 mg | ORAL_TABLET | Freq: Two times a day (BID) | ORAL | Status: DC

## 2013-07-15 MED ORDER — OXYCODONE-ACETAMINOPHEN 5-325 MG PO TABS: 1.0000 | ORAL_TABLET | ORAL | Status: DC | PRN

## 2013-07-15 MED ORDER — ONDANSETRON HCL 4 MG/2ML IJ SOLN
4.0000 mg | Freq: Once | INTRAMUSCULAR | Status: AC
  Administered 2013-07-15: 4 mg via INTRAVENOUS
  Filled 2013-07-15: qty 2

## 2013-07-15 MED ORDER — METRONIDAZOLE 500 MG PO TABS: 500.0000 mg | ORAL_TABLET | Freq: Two times a day (BID) | ORAL | Status: DC

## 2013-07-15 MED ORDER — SODIUM CHLORIDE 0.9 % IV BOLUS (SEPSIS)
1000.0000 mL | Freq: Once | INTRAVENOUS | Status: AC
  Administered 2013-07-15: 1000 mL via INTRAVENOUS

## 2013-07-15 MED ORDER — METRONIDAZOLE 500 MG PO TABS
500.0000 mg | ORAL_TABLET | Freq: Once | ORAL | Status: AC
  Administered 2013-07-15: 500 mg via ORAL
  Filled 2013-07-15: qty 1

## 2013-07-15 MED ORDER — IOHEXOL 300 MG/ML  SOLN
80.0000 mL | Freq: Once | INTRAMUSCULAR | Status: AC | PRN
  Administered 2013-07-15: 80 mL via INTRAVENOUS

## 2013-07-15 MED ORDER — CIPROFLOXACIN HCL 500 MG PO TABS
500.0000 mg | ORAL_TABLET | Freq: Once | ORAL | Status: AC
  Administered 2013-07-15: 500 mg via ORAL
  Filled 2013-07-15: qty 1

## 2013-07-15 MED ORDER — ONDANSETRON HCL 4 MG PO TABS: 4.0000 mg | ORAL_TABLET | Freq: Four times a day (QID) | ORAL | Status: DC

## 2013-07-15 MED ORDER — MORPHINE SULFATE 4 MG/ML IJ SOLN
4.0000 mg | Freq: Once | INTRAMUSCULAR | Status: AC
  Administered 2013-07-15: 4 mg via INTRAVENOUS
  Filled 2013-07-15: qty 1

## 2013-07-15 MED ORDER — IOHEXOL 300 MG/ML  SOLN
25.0000 mL | INTRAMUSCULAR | Status: AC
  Administered 2013-07-15: 25 mL via ORAL

## 2013-07-15 MED ORDER — CIPROFLOXACIN HCL 500 MG PO TABS
500.0000 mg | ORAL_TABLET | Freq: Once | ORAL | Status: DC
  Filled 2013-07-15: qty 1

## 2013-07-15 NOTE — ED Notes (Signed)
Sent from [facility] for further eval of LLQ abd and L flank pain and frequent urination x 2 days. Labs drawn at [facility]

## 2013-07-15 NOTE — ED Notes (Signed)
CT notified that pt finished with contrast. 

## 2013-07-15 NOTE — ED Provider Notes (Signed)
Danielle [name] is a 57 y.o. female who presents to Urgent Care today for left lower quadrant abdominal pain present for the last several days. The pain is worse in today. She notes urinary frequency, urgency, and dysuria. Her symptoms are consistent with prior episodes of kidney stone or urinary tract infection. She notes mild fever and chills and nausea. She denies any diarrhea vomiting or constipation. She denies any chest pains or trouble breathing. She does not take any medications for this yet. She has not had any blood in her urine this will. She denies any vaginal discharge. She is postmenopausal.   Patient has a surgical history significant for TAB BSO.  Past Medical History  Diagnosis Date  . Hypertension   . Glucose intolerance (pre-diabetes)   . History of tobacco abuse   . History of hiatal hernia   . Hyperthyroidism   . Tachycardia   . Stroke   . 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 Obesity   . Dyslipidemia   . Gastroesophageal reflux disease    History  Substance Use Topics  . Smoking status: Current Every Day Smoker -- 0.25 packs/day for 45 years    Types: Cigarettes  . Smokeless tobacco: Never Used  . Alcohol Use: No   ROS as above Medications reviewed. No current facility-administered medications for this encounter.   Current Outpatient Prescriptions  Medication Sig Dispense Refill  . hydrochlorothiazide (MICROZIDE) 12.5 MG capsule TAKE ONE CAPSULE BY MOUTH EVERY DAY  30 capsule  5  . levothyroxine (SYNTHROID, LEVOTHROID) 175 MCG tablet TAKE 1 TABLET BY MOUTH ONCE DAILY  90 tablet  3  . lisinopril (PRINIVIL,ZESTRIL) 20 MG tablet Take 1 tablet (20 mg total) by mouth daily.  90 tablet  3  . simvastatin (ZOCOR) 40 MG tablet TAKE 1 TABLET BY MOUTH EVERY EVENING  90 tablet  2  . clopidogrel (PLAVIX) 75 MG tablet Take 1 tablet (75 mg total) by mouth daily.  90 tablet  3  . HYDROcodone-acetaminophen (NORCO) 7.5-325 MG per tablet Take 325 tablets by mouth as needed.      Marland Kitchen  ibuprofen (ADVIL,MOTRIN) 800 MG tablet Take 800 mg by mouth as needed.      Marland Kitchen KLOR-CON M20 20 MEQ tablet TAKE 1 TABLET BY MOUTH TWICE A DAY  90 tablet  3  . metoprolol succinate (TOPROL-XL) 100 MG 24 hr tablet Take 1 tablet (100 mg total) by mouth daily. Take with or immediately following a meal.  90 tablet  3    Exam:  BP 154/107  Pulse 104  Temp(Src) 99.6 F (37.6 C) (Oral)  Resp 21  SpO2 96% Gen: Well NAD HEENT: EOMI,  MMM Lungs: CTABL Nl WOB Heart: RRR no MRG Abd: NABS,  ND, no CV angle tenderness to percussion, left lower quadrant abdominal tenderness to palpation with no significant rebound or guarding. Exts: Non edematous BL  LE, warm and well perfused.  GYN: Normal external genitalia. Vaginal canal normal appearing vaginal cuff normal-appearing.  No masses palpated.  Results for orders placed during the hospital encounter of 07/15/13 (from the past 24 hour(s))  POCT URINALYSIS DIP (DEVICE)     Status: None   Collection Time    07/15/13  9:49 AM      Result Value Range   Glucose, UA NEGATIVE  NEGATIVE mg/dL   Bilirubin Urine NEGATIVE  NEGATIVE   Ketones, ur NEGATIVE  NEGATIVE mg/dL   Specific Gravity, Urine 1.015  1.005 - 1.030  Hgb urine dipstick NEGATIVE  NEGATIVE   pH 7.0  5.0 - 8.0   Protein, ur NEGATIVE  NEGATIVE mg/dL   Urobilinogen, UA 0.2  0.0 - 1.0 mg/dL   Nitrite NEGATIVE  NEGATIVE   Leukocytes, UA NEGATIVE  NEGATIVE  CBC     Status: Abnormal   Collection Time    07/15/13  9:59 AM      Result Value Range   WBC 16.3 (*) 4.0 - 10.5 K/uL   RBC 4.63  3.87 - 5.11 MIL/uL   Hemoglobin 14.2  12.0 - 15.0 g/dL   HCT 47.8  29.5 - 62.1 %   MCV 92.2  78.0 - 100.0 fL   MCH 30.7  26.0 - 34.0 pg   MCHC 33.3  30.0 - 36.0 g/dL   RDW 30.8  65.7 - 84.6 %   Platelets 294  150 - 400 K/uL   No results found.  Assessment and Plan: 57 y.o. female with left lower corner and abdominal pain with elevated white count. This is most likely diverticulitis.  Patient does not  have a medical history significant for diverticulitis. Plan to transfer to the emergency room via shuttle for further evaluation and management most likely with CT scan.  Comprehensive metabolic panel is pending.  Discussed warning signs or symptoms. Please see discharge instructions. Patient expresses understanding.      Rodolph Bong, MD 07/15/13 [phone]

## 2013-07-15 NOTE — ED Provider Notes (Signed)
TIME SEEN: 12:14 PM  CHIEF COMPLAINT: Left lower quadrant pain  HPI: Patient is a 57 year old female with a history of hypertension, hyperlipidemia, and hypothyroidism, TIA, kidney stones who presents emergency department 2 days of left lower quadrant pain. She describes as a sharp, shooting pain without radiation. No fevers or chills. She has had nausea but no vomiting or diarrhea. No bloody stool or melena. She has had no dysuria or hematuria. She has had urinary frequency. No vaginal bleeding or discharge. She states this does not feel like her prior kidney stones. She denies any aggravating or alleviating factors. She was seen in urgent care today and had a leukocytosis of 16.3 and and a normal urinalysis. She was sent to evaluate for possible intra-abdominal pathology including diverticulitis. Patient denies a prior history of colitis, diverticulitis. She states her last colonoscopy was many years ago. She is status post total hysterectomy and cholecystectomy.  ROS: See HPI Constitutional: no fever  Eyes: no drainage  ENT: no runny nose   Cardiovascular:  no chest pain  Resp: no SOB  GI: no vomiting GU: no dysuria Integumentary: no rash  Allergy: no hives  Musculoskeletal: no leg swelling  Neurological: no slurred speech ROS otherwise negative  PAST MEDICAL HISTORY/PAST SURGICAL HISTORY:  Past Medical History  Diagnosis Date  . Hypertension   . Glucose intolerance (pre-diabetes)   . History of tobacco abuse   . History of hiatal hernia   . Hyperthyroidism   . Tachycardia   . Stroke   . 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 Obesity   . Dyslipidemia   . Gastroesophageal reflux disease     MEDICATIONS:  Prior to Admission medications   Medication Sig Start Date End Date Taking? Authorizing Provider  clopidogrel (PLAVIX) 75 MG tablet Take 75 mg by mouth daily.   Yes Historical Provider, MD  hydrochlorothiazide (MICROZIDE) 12.5 MG capsule TAKE ONE CAPSULE BY MOUTH EVERY DAY  08/18/12  Yes Sanjuana Letters, MD  HYDROcodone-acetaminophen (NORCO) 7.5-325 MG per tablet Take 325 tablets by mouth as needed. 04/26/13  Yes Historical Provider, MD  ibuprofen (ADVIL,MOTRIN) 800 MG tablet Take 800 mg by mouth as needed. 02/14/13  Yes Historical Provider, MD  levothyroxine (SYNTHROID, LEVOTHROID) 175 MCG tablet Take 175 mcg by mouth daily before breakfast.   Yes Historical Provider, MD  lisinopril (PRINIVIL,ZESTRIL) 20 MG tablet Take 1 tablet (20 mg total) by mouth daily. 07/25/12  Yes Sanjuana Letters, MD  metoprolol succinate (TOPROL-XL) 100 MG 24 hr tablet Take 100 mg by mouth daily. Take with or immediately following a meal.   Yes Historical Provider, MD  simvastatin (ZOCOR) 40 MG tablet Take 40 mg by mouth every evening.   Yes Historical Provider, MD    ALLERGIES:  Allergies  Allergen Reactions  . Cephalexin Other (See Comments)    burns stomach  . Codeine Hives and Nausea And Vomiting    SOCIAL HISTORY:  History  Substance Use Topics  . Smoking status: Current Every Day Smoker -- 0.25 packs/day for 45 years    Types: Cigarettes  . Smokeless tobacco: Never Used  . Alcohol Use: No    FAMILY HISTORY: Family History  Problem Relation Age of Onset  . Heart attack Father   . Cancer Father   . Diabetes Father   . Heart attack Brother   . Cancer Other   . Coronary artery disease Other   . Stroke Other   . Diabetes Other   . Hypertension  Other   . Heart failure Other   . Stroke Mother   . Diabetes Mother     EXAM: BP 151/98  Pulse 110  Temp(Src) 98.2 F (36.8 C) (Oral)  Resp 18  SpO2 96% CONSTITUTIONAL: Alert and oriented and responds appropriately to questions. Well-appearing; well-nourished HEAD: Normocephalic EYES: Conjunctivae clear, PERRL ENT: normal nose; no rhinorrhea; moist mucous membranes; pharynx without lesions noted NECK: Supple, no meningismus, no LAD  CARD: RRR; S1 and S2 appreciated; no murmurs, no clicks, no rubs, no  gallops RESP: Normal chest excursion without splinting or tachypnea; breath sounds clear and equal bilaterally; no wheezes, no rhonchi, no rales,  ABD/GI: Normal bowel sounds; non-distended; soft, patient is tender to palpation in the left lower quadrant with mild voluntary guarding, no rebound, no peritoneal signs BACK:  The back appears normal and is non-tender to palpation, there is no CVA tenderness EXT: Normal ROM in all joints; non-tender to palpation; no edema; normal capillary refill; no cyanosis    SKIN: Normal color for age and race; warm NEURO: Moves all extremities equally PSYCH: The patient's mood and manner are appropriate. Grooming and personal hygiene are appropriate.  MEDICAL DECISION MAKING: Patient with leukocytosis with normal urine, mild tachycardia with left lower quadrant pain. Will obtain a CT of her abdomen and pelvis to evaluate for possible intra-abdominal pathology including colitis, diverticulitis. We'll give IV fluids, pain medication and anti-medics.  ED PROGRESS: Patient CT scan shows diverticulitis without abscess. Her heart rate has improved today use. Discussed with patient and she reports she would like discharge home with close outpatient followup with her primary care physician. Will give oral Cipro and Flagyl. Given return precautions. Patient verbalizes understanding is comfortable this plan.    Layla Maw Teyla Skidgel, DO 07/15/13 1501

## 2013-07-15 NOTE — ED Notes (Signed)
Record review

## 2013-07-15 NOTE — ED Notes (Signed)
C/o left flank and lower left abdominal pain. Urinary urgency with incontinence. Nausea. Onset 2 days ago. Hx of kidney stones.  No otc meds taken for symptoms.

## 2013-07-15 NOTE — ED Notes (Addendum)
Affirm: Candida, Gardnerella and Trich neg., GC/Chlamydia pending. Danielle [name] 07/15/2013 GC/Chlamydia neg. 07/16/2013

## 2013-07-16 ENCOUNTER — Ambulatory Visit: Payer: Medicare Other

## 2013-07-24 ENCOUNTER — Encounter: Payer: Self-pay | Admitting: Family Medicine

## 2013-07-24 ENCOUNTER — Ambulatory Visit (INDEPENDENT_AMBULATORY_CARE_PROVIDER_SITE_OTHER): Payer: Medicare Other | Admitting: Family Medicine

## 2013-07-24 VITALS — BP 139/93 | HR 93 | Temp 98.6°F | Ht 65.5 in | Wt 195.0 lb

## 2013-07-24 DIAGNOSIS — Z23 Encounter for immunization: Secondary | ICD-10-CM

## 2013-07-24 DIAGNOSIS — D126 Benign neoplasm of colon, unspecified: Secondary | ICD-10-CM

## 2013-07-24 DIAGNOSIS — E7800 Pure hypercholesterolemia, unspecified: Secondary | ICD-10-CM

## 2013-07-24 DIAGNOSIS — K5732 Diverticulitis of large intestine without perforation or abscess without bleeding: Secondary | ICD-10-CM

## 2013-07-24 DIAGNOSIS — E039 Hypothyroidism, unspecified: Secondary | ICD-10-CM

## 2013-07-24 DIAGNOSIS — G459 Transient cerebral ischemic attack, unspecified: Secondary | ICD-10-CM

## 2013-07-24 DIAGNOSIS — K219 Gastro-esophageal reflux disease without esophagitis: Secondary | ICD-10-CM

## 2013-07-24 MED ORDER — METOPROLOL SUCCINATE ER 100 MG PO TB24: 100.0000 mg | ORAL_TABLET | Freq: Every day | ORAL | Status: DC

## 2013-07-24 MED ORDER — HYDROCHLOROTHIAZIDE 12.5 MG PO CAPS: ORAL_CAPSULE | ORAL | Status: DC

## 2013-07-24 MED ORDER — FAMOTIDINE 40 MG PO TABS: 40.0000 mg | ORAL_TABLET | Freq: Every day | ORAL | Status: DC

## 2013-07-24 MED ORDER — CLOPIDOGREL BISULFATE 75 MG PO TABS: 75.0000 mg | ORAL_TABLET | Freq: Every day | ORAL | Status: DC

## 2013-07-24 MED ORDER — SIMVASTATIN 40 MG PO TABS: 40.0000 mg | ORAL_TABLET | Freq: Every evening | ORAL | Status: DC

## 2013-07-24 MED ORDER — LISINOPRIL 20 MG PO TABS
20.0000 mg | ORAL_TABLET | Freq: Every day | ORAL | Status: DC
Start: 2013-07-24 — End: 2014-06-27

## 2013-07-24 NOTE — Patient Instructions (Addendum)
About your colonoscopy, you may not need right now.  You did have a polyp in 2009.  In general, there are two types of polyps. Hyperplastic polyps, which my records show was the type you had, are not pre cancerous and you can still do every 10 year colonoscopies. Adenomatous polyps are precancerous and would deserve a five year follow up.  You should probably call the GI docs office and ask to be sure the type of polyp. Call early if you develop symptoms of diverticulitis again.  It may happen again and we will want to get you on antibiotics early I will put you on an acid reducing medicine to help your stomach. Come back in for fasting cholesterol

## 2013-07-25 DIAGNOSIS — K5732 Diverticulitis of large intestine without perforation or abscess without bleeding: Secondary | ICD-10-CM | POA: Insufficient documentation

## 2013-07-25 NOTE — Assessment & Plan Note (Signed)
My records show hyperplastic polyps only.  She is getting recalled by GI for five year follow up.  She will double check, but I doubt necessary.

## 2013-07-25 NOTE — Assessment & Plan Note (Signed)
Nicely resolved with conservative RX.

## 2013-07-25 NOTE — Assessment & Plan Note (Signed)
Sounds like a flair accompanied by mild esophageal dysmotility.  Start H2 blocker.

## 2013-07-25 NOTE — Assessment & Plan Note (Signed)
Due for lab check. 

## 2013-07-25 NOTE — Progress Notes (Signed)
  Subjective:    Patient ID: Danielle [name], female    DOB: [birth year], 57 y.o.   MRN: [id number]  HPI  Patient is follow up of ER for acute abd pain.  Actually has two different issues. 1. Had left lower quadrant abd pain progressive over 1week.  Seen in ER and had CT dx of diverticulitis.  Placed on cipro and flagyl and has dramatically improved.  This pain has resolved and she will complete her antibiotics tomorrow. 2. One month of epigastric discomfort.  Several months of intermittent swallowing difficulty.  Known GERD, not on antacid or PPI.    Review of Systems     Objective:   Physical Exam Abd benign.       Assessment & Plan:

## 2013-08-12 ENCOUNTER — Other Ambulatory Visit: Payer: Self-pay

## 2013-08-12 DIAGNOSIS — Z803 Family history of malignant neoplasm of breast: Secondary | ICD-10-CM

## 2013-08-12 DIAGNOSIS — Z1231 Encounter for screening mammogram for malignant neoplasm of breast: Secondary | ICD-10-CM

## 2013-09-02 ENCOUNTER — Ambulatory Visit
Admission: RE | Admit: 2013-09-02 | Discharge: 2013-09-02 | Disposition: A | Discharge status: Home / Self Care | Payer: Medicare Other | Source: Ambulatory Visit

## 2013-09-02 DIAGNOSIS — Z1231 Encounter for screening mammogram for malignant neoplasm of breast: Secondary | ICD-10-CM

## 2013-09-02 DIAGNOSIS — Z803 Family history of malignant neoplasm of breast: Secondary | ICD-10-CM

## 2013-09-12 ENCOUNTER — Encounter: Payer: Self-pay | Admitting: Neurology

## 2013-09-12 ENCOUNTER — Ambulatory Visit (INDEPENDENT_AMBULATORY_CARE_PROVIDER_SITE_OTHER): Payer: Medicare Other | Admitting: Neurology

## 2013-09-12 VITALS — BP 138/88 | HR 70 | Wt 197.0 lb

## 2013-09-12 DIAGNOSIS — M47812 Spondylosis without myelopathy or radiculopathy, cervical region: Secondary | ICD-10-CM | POA: Insufficient documentation

## 2013-09-12 DIAGNOSIS — M79609 Pain in unspecified limb: Secondary | ICD-10-CM

## 2013-09-12 HISTORY — DX: Spondylosis without myelopathy or radiculopathy, cervical region: M47.812

## 2013-09-12 MED ORDER — NORTRIPTYLINE HCL 10 MG PO CAPS: ORAL_CAPSULE | ORAL | Status: DC

## 2013-09-12 NOTE — Progress Notes (Signed)
Reason for visit: Neck and arm discomfort  Danielle [name] is an 57 y.o. female  History of present illness:   Danielle [name] is a 57 year old white female with a history of neck and shoulder discomfort, and some numbness into the ulnar aspect of the hand primarily on the right side. The patient also reports some occasional shock sensations in the legs, and she reports some pain and pressure sensations behind the ear on the right. The patient does have some discomfort into the shoulder and shoulder blade on the right, but she also has some problems on the left side as well. The patient underwent MRI evaluation of the cervical spine that shows evidence of bilateral neuroforaminal stenosis at the C5-6 level that is severe, and at the C6-7 level on the right that is severe. The patient underwent an epidural steroid injection of the neck that did not help. The patient is also followed by Dr. Otelia Sergeant for her low back pain, and she has been seen by Dr. Thyra Breed through the pain center without much benefit. The patient has undergone physical therapy, and she has a traction device that does not help. The patient returns to this office for an evaluation.  Past Medical History  Diagnosis Date  . Hypertension   . Glucose intolerance (pre-diabetes)   . History of tobacco abuse   . History of hiatal hernia   . Hyperthyroidism   . Tachycardia   . Stroke   . 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 Obesity   . Dyslipidemia   . Gastroesophageal reflux disease   . Cervical spondylosis without myelopathy 09/12/2013    Past Surgical History  Procedure Laterality Date  . Abdominal hysterectomy    . Cholecystectomy    . Back surgery      10 spine surgeries since 1988.  . Tonsillectomy    . Appendectomy      Family History  Problem Relation Age of Onset  . Heart attack Father   . Cancer Father   . Diabetes Father   . Heart attack Brother   . Cancer Other   . Coronary artery disease Other   . Stroke  Other   . Diabetes Other   . Hypertension Other   . Heart failure Other   . Stroke Mother   . Diabetes Mother     Social history:  reports that she has been smoking Cigarettes.  She has a 11.25 pack-year smoking history. She has never used smokeless tobacco. She reports that she does not drink alcohol or use illicit drugs.    Allergies  Allergen Reactions  . Cephalexin Other (See Comments)    burns stomach  . Codeine Hives and Nausea And Vomiting    Medications:  Current Outpatient Prescriptions on File Prior to Visit  Medication Sig Dispense Refill  . clopidogrel (PLAVIX) 75 MG tablet Take 1 tablet (75 mg total) by mouth daily.  30 tablet  12  . famotidine (PEPCID) 40 MG tablet Take 1 tablet (40 mg total) by mouth daily. For heartburn  30 tablet  12  . hydrochlorothiazide (MICROZIDE) 12.5 MG capsule TAKE ONE CAPSULE BY MOUTH EVERY DAY  30 capsule  12  . ibuprofen (ADVIL,MOTRIN) 800 MG tablet Take 800 mg by mouth as needed.      Marland Kitchen levothyroxine (SYNTHROID, LEVOTHROID) 175 MCG tablet Take 175 mcg by mouth daily before breakfast.      . lisinopril (PRINIVIL,ZESTRIL) 20 MG tablet Take 1 tablet (20 mg  total) by mouth daily.  30 tablet  12  . metoprolol succinate (TOPROL-XL) 100 MG 24 hr tablet Take 1 tablet (100 mg total) by mouth daily. Take with or immediately following a meal.  30 tablet  12  . simvastatin (ZOCOR) 40 MG tablet Take 1 tablet (40 mg total) by mouth every evening.  30 tablet  12   No current facility-administered medications on file prior to visit.    ROS:  Out of a complete 14 system review of symptoms, the patient complains only of the following symptoms, and all other reviewed systems are negative.  Swelling the legs Easy bruising Feeling hot, increased thirst Headache, numbness, weakness  Blood pressure 138/88, pulse 70, weight 197 lb (89.359 kg).  Physical Exam  General: The patient is alert and cooperative at the time of the examination. The patient is  moderately obese.  Neuromuscular: The patient lacks about 10-15 of lateral rotation of the cervical spine bilaterally.  Skin: No significant peripheral edema is noted.   Neurologic Exam  Mental status: The patient is oriented x 3.  Cranial nerves: Facial symmetry is present. Speech is normal, no aphasia or dysarthria is noted. Extraocular movements are full. Visual fields are full.  Motor: The patient has good strength in all 4 extremities.  Sensory examination: Soft touch sensation is symmetric on the face, arms, and legs.  Coordination: The patient has good finger-nose-finger and heel-to-shin bilaterally.  Gait and station: The patient has a normal gait. Tandem gait is normal. Romberg is negative. No drift is seen.  Reflexes: Deep tendon reflexes are symmetric.   Assessment/Plan:  1. Cervical spondylosis, right greater than left upper extremity discomfort  2. Chronic low back pain  The patient does have neuroforaminal stenosis that may explain some of her neck and arm discomfort. The patient did not respond to the epidural steroid injection. The patient will be placed on nortriptyline in low dose, and she will followup through this office in about 4 months. The patient has not responded to physical therapy, or to cervical traction. The epidural injection could be repeated in the future.  Marlan Palau MD 09/12/2013 6:32 PM  [facility] [address] [place], [place] [address]  Phone [phone] Fax [phone]

## 2013-09-12 NOTE — Patient Instructions (Signed)
    Pamelor (nortriptyline) is an antidepressant medication that has many uses that may include headache, whiplash injuries, or for peripheral neuropathy pain. Side effects may include drowsiness, dry mouth, blurred vision, or constipation. As with any antidepressant medication, worsening depression may occur. If you had any significant side effects, please call our office. The full effects of this medication may take 7-10 days after starting the drug, or going up on the dose.  

## 2013-10-18 ENCOUNTER — Other Ambulatory Visit (HOSPITAL_COMMUNITY): Payer: Self-pay | Admitting: Specialist

## 2013-10-18 DIAGNOSIS — M533 Sacrococcygeal disorders, not elsewhere classified: Secondary | ICD-10-CM

## 2013-10-18 DIAGNOSIS — R102 Pelvic and perineal pain: Secondary | ICD-10-CM

## 2013-10-18 DIAGNOSIS — M7918 Myalgia, other site: Secondary | ICD-10-CM

## 2013-10-24 ENCOUNTER — Ambulatory Visit (HOSPITAL_COMMUNITY)
Admission: RE | Admit: 2013-10-24 | Discharge: 2013-10-24 | Disposition: A | Discharge status: Home / Self Care | Payer: Medicare Other | Source: Ambulatory Visit | Attending: Specialist | Admitting: Specialist

## 2013-10-24 ENCOUNTER — Encounter (HOSPITAL_COMMUNITY)
Admission: RE | Admit: 2013-10-24 | Discharge: 2013-10-24 | Disposition: A | Discharge status: Home / Self Care | Payer: Medicare Other | Source: Ambulatory Visit | Attending: Specialist | Admitting: Specialist

## 2013-10-24 DIAGNOSIS — M549 Dorsalgia, unspecified: Secondary | ICD-10-CM | POA: Insufficient documentation

## 2013-10-24 DIAGNOSIS — M6281 Muscle weakness (generalized): Secondary | ICD-10-CM | POA: Insufficient documentation

## 2013-10-24 DIAGNOSIS — R102 Pelvic and perineal pain: Secondary | ICD-10-CM

## 2013-10-24 DIAGNOSIS — M7918 Myalgia, other site: Secondary | ICD-10-CM

## 2013-10-24 DIAGNOSIS — M533 Sacrococcygeal disorders, not elsewhere classified: Secondary | ICD-10-CM

## 2013-10-24 DIAGNOSIS — Z981 Arthrodesis status: Secondary | ICD-10-CM | POA: Insufficient documentation

## 2013-10-24 MED ORDER — TECHNETIUM TC 99M MEDRONATE IV KIT
25.0000 | PACK | Freq: Once | INTRAVENOUS | Status: AC | PRN
  Administered 2013-10-24: 25 via INTRAVENOUS

## 2013-11-04 ENCOUNTER — Other Ambulatory Visit (INDEPENDENT_AMBULATORY_CARE_PROVIDER_SITE_OTHER): Payer: Medicare Other

## 2013-11-04 DIAGNOSIS — R350 Frequency of micturition: Secondary | ICD-10-CM

## 2013-11-04 LAB — POCT URINALYSIS DIPSTICK
Bilirubin, UA: NEGATIVE
Glucose, UA: NEGATIVE
Leukocytes, UA: NEGATIVE
Nitrite, UA: NEGATIVE
pH, UA: 8

## 2013-11-24 ENCOUNTER — Emergency Department (HOSPITAL_COMMUNITY)
Admission: EM | Admit: 2013-11-24 | Discharge: 2013-11-24 | Disposition: A | Discharge status: Home / Self Care | Payer: Medicare Other | Source: Home / Self Care | Attending: Emergency Medicine | Admitting: Emergency Medicine

## 2013-11-24 ENCOUNTER — Emergency Department (INDEPENDENT_AMBULATORY_CARE_PROVIDER_SITE_OTHER): Payer: Medicare Other

## 2013-11-24 ENCOUNTER — Encounter (HOSPITAL_COMMUNITY): Payer: Self-pay | Admitting: Emergency Medicine

## 2013-11-24 DIAGNOSIS — R69 Illness, unspecified: Principal | ICD-10-CM

## 2013-11-24 DIAGNOSIS — J111 Influenza due to unidentified influenza virus with other respiratory manifestations: Secondary | ICD-10-CM

## 2013-11-24 MED ORDER — OSELTAMIVIR PHOSPHATE 75 MG PO CAPS: 75.0000 mg | ORAL_CAPSULE | Freq: Two times a day (BID) | ORAL | Status: DC

## 2013-11-24 NOTE — Discharge Instructions (Signed)
Your chest xray was without evidence of pneumonia

## 2013-11-24 NOTE — ED Provider Notes (Signed)
CSN: [id number]     Arrival date & time 11/24/13  1808 History   First MD Initiated Contact with Patient 11/24/13 1823     Chief Complaint  Patient presents with  . Otalgia  . Sore Throat   (Consider location/radiation/quality/duration/timing/severity/associated sxs/prior Treatment) HPI Comments: Did have 2014-2015 flu shot. Also reports that she has developed some discomfort in region of left scapula over past 24 hours.   Patient is a 58 y.o. female presenting with pharyngitis and URI. The history is provided by the patient.  Sore Throat  URI Presenting symptoms: cough, ear pain, fatigue, fever, rhinorrhea and sore throat   Severity:  Moderate Onset quality:  Sudden Duration:  2 days Timing:  Constant Progression:  Worsening Chronicity:  New Associated symptoms: myalgias   Associated symptoms comment:  +mild nausea and diarrhea without vomiting   Past Medical History  Diagnosis Date  . Hypertension   . Glucose intolerance (pre-diabetes)   . History of tobacco abuse   . History of hiatal hernia   . Hyperthyroidism   . Tachycardia   . Stroke   . 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 Obesity   . Dyslipidemia   . Gastroesophageal reflux disease   . Cervical spondylosis without myelopathy 09/12/2013   Past Surgical History  Procedure Laterality Date  . Abdominal hysterectomy    . Cholecystectomy    . Back surgery      10 spine surgeries since 1988.  . Tonsillectomy    . Appendectomy     Family History  Problem Relation Age of Onset  . Heart attack Father   . Cancer Father   . Diabetes Father   . Heart attack Brother   . Cancer Other   . Coronary artery disease Other   . Stroke Other   . Diabetes Other   . Hypertension Other   . Heart failure Other   . Stroke Mother   . Diabetes Mother    History  Substance Use Topics  . Smoking status: Current Every Day Smoker -- 0.25 packs/day for 45 years    Types: Cigarettes  . Smokeless tobacco: Never Used  .  Alcohol Use: No   OB History   Grav Para Term Preterm Abortions TAB SAB Ect Mult Living                 Review of Systems  Constitutional: Positive for fever and fatigue.  HENT: Positive for ear pain, rhinorrhea and sore throat.   Respiratory: Positive for cough.   Musculoskeletal: Positive for myalgias.  All other systems reviewed and are negative.    Allergies  Cephalexin and Codeine  Home Medications   Current Outpatient Rx  Name  Route  Sig  Dispense  Refill  . clopidogrel (PLAVIX) 75 MG tablet   Oral   Take 1 tablet (75 mg total) by mouth daily.   30 tablet   12   . famotidine (PEPCID) 40 MG tablet   Oral   Take 1 tablet (40 mg total) by mouth daily. For heartburn   30 tablet   12   . hydrochlorothiazide (MICROZIDE) 12.5 MG capsule      TAKE ONE CAPSULE BY MOUTH EVERY DAY   30 capsule   12   . HYDROcodone-acetaminophen (NORCO) 7.5-325 MG per tablet   Oral   Take 7.5-325 tablets by mouth every 6 (six) hours as needed.         Marland Kitchen ibuprofen (ADVIL,MOTRIN) 800 MG tablet  Oral   Take 800 mg by mouth as needed.         Marland Kitchen levothyroxine (SYNTHROID, LEVOTHROID) 175 MCG tablet   Oral   Take 175 mcg by mouth daily before breakfast.         . lisinopril (PRINIVIL,ZESTRIL) 20 MG tablet   Oral   Take 1 tablet (20 mg total) by mouth daily.   30 tablet   12   . metoprolol succinate (TOPROL-XL) 100 MG 24 hr tablet   Oral   Take 1 tablet (100 mg total) by mouth daily. Take with or immediately following a meal.   30 tablet   12   . nortriptyline (PAMELOR) 10 MG capsule      Take one capsule at night for one week, then take 2 capsules at night for one week, then take 3 capsules at night   90 capsule   3   . simvastatin (ZOCOR) 40 MG tablet   Oral   Take 1 tablet (40 mg total) by mouth every evening.   30 tablet   12   . VOLTAREN 1 % GEL   Topical   Apply 1 g topically daily.          BP 114/76  Pulse 107  Temp(Src) 98.4 F (36.9 C) (Oral)   Resp 21  SpO2 96% Physical Exam  Nursing note and vitals reviewed. Constitutional: She is oriented to person, place, and time. She appears well-developed and well-nourished. No distress.  HENT:  Head: Normocephalic and atraumatic.  Right Ear: Hearing, tympanic membrane, external ear and ear canal normal.  Left Ear: Hearing, tympanic membrane, external ear and ear canal normal.  Nose: Nose normal.  Mouth/Throat: Uvula is midline, oropharynx is clear and moist and mucous membranes are normal.  Eyes: Conjunctivae are normal. Right eye exhibits no discharge. Left eye exhibits no discharge. No scleral icterus.  Neck: Normal range of motion. Neck supple.  Cardiovascular: Normal rate, regular rhythm and normal heart sounds.   Pulmonary/Chest: Effort normal and breath sounds normal.  Abdominal: Soft. Bowel sounds are normal. She exhibits no distension. There is no tenderness.  Musculoskeletal: Normal range of motion.  Lymphadenopathy:    She has no cervical adenopathy.  Neurological: She is alert and oriented to person, place, and time.  Skin: Skin is warm and dry.  Psychiatric: She has a normal mood and affect. Her behavior is normal.    ED Course  Procedures (including critical care time) Labs Review Labs Reviewed - No data to display Imaging Review No results found.  EKG Interpretation    Date/Time:    Ventricular Rate:    PR Interval:    QRS Duration:   QT Interval:    QTC Calculation:   R Axis:     Text Interpretation:              MDM  CXR without evidence of CAP or bronchitis. Will treat for influenza like illness with Tamiflu and advise PCP follow up. Educated regarding additional symptomatic care at home.     [place], [place] 11/24/13 [phone]

## 2013-11-24 NOTE — ED Provider Notes (Signed)
Medical screening examination/treatment/procedure(s) were performed by non-physician practitioner and as supervising physician I was immediately available for consultation/collaboration.  Diyari Cherne, M.D.  Naomee Nowland C Navina Wohlers, MD 11/24/13 2150 

## 2013-11-24 NOTE — ED Notes (Signed)
Patient states is not feeling well Has been having ear pain and fever Sore throat Aches and chills past three days

## 2013-12-17 ENCOUNTER — Other Ambulatory Visit: Payer: Self-pay | Admitting: Specialist

## 2013-12-17 DIAGNOSIS — R1031 Right lower quadrant pain: Secondary | ICD-10-CM

## 2013-12-17 DIAGNOSIS — R1032 Left lower quadrant pain: Principal | ICD-10-CM

## 2013-12-18 ENCOUNTER — Other Ambulatory Visit: Payer: Self-pay | Admitting: Specialist

## 2013-12-19 ENCOUNTER — Ambulatory Visit
Admission: RE | Admit: 2013-12-19 | Discharge: 2013-12-19 | Disposition: A | Discharge status: Home / Self Care | Payer: Medicare Other | Source: Ambulatory Visit | Attending: Specialist | Admitting: Specialist

## 2013-12-19 ENCOUNTER — Other Ambulatory Visit: Payer: Self-pay | Admitting: Specialist

## 2013-12-19 VITALS — BP 113/76 | HR 94

## 2013-12-19 DIAGNOSIS — R1031 Right lower quadrant pain: Secondary | ICD-10-CM

## 2013-12-19 DIAGNOSIS — E2839 Other primary ovarian failure: Secondary | ICD-10-CM

## 2013-12-19 DIAGNOSIS — IMO0002 Reserved for concepts with insufficient information to code with codable children: Secondary | ICD-10-CM

## 2013-12-19 DIAGNOSIS — R1032 Left lower quadrant pain: Principal | ICD-10-CM

## 2013-12-19 DIAGNOSIS — M47812 Spondylosis without myelopathy or radiculopathy, cervical region: Secondary | ICD-10-CM

## 2013-12-19 MED ORDER — ONDANSETRON HCL 4 MG/2ML IJ SOLN
4.0000 mg | Freq: Once | INTRAMUSCULAR | Status: AC
Start: 2013-12-19 — End: 2013-12-19
  Administered 2013-12-19: 4 mg via INTRAMUSCULAR

## 2013-12-19 MED ORDER — IOHEXOL 300 MG/ML  SOLN
10.0000 mL | Freq: Once | INTRAMUSCULAR | Status: AC | PRN
  Administered 2013-12-19: 10 mL via INTRATHECAL

## 2013-12-19 MED ORDER — DIAZEPAM 5 MG PO TABS
10.0000 mg | ORAL_TABLET | Freq: Once | ORAL | Status: AC
  Administered 2013-12-19: 10 mg via ORAL

## 2013-12-19 MED ORDER — MEPERIDINE HCL 100 MG/ML IJ SOLN
100.0000 mg | Freq: Once | INTRAMUSCULAR | Status: AC
Start: 2013-12-19 — End: 2013-12-19
  Administered 2013-12-19: 100 mg via INTRAMUSCULAR

## 2013-12-19 NOTE — Discharge Instructions (Signed)

## 2013-12-27 ENCOUNTER — Ambulatory Visit (INDEPENDENT_AMBULATORY_CARE_PROVIDER_SITE_OTHER): Payer: Medicare Other | Admitting: Family Medicine

## 2013-12-27 ENCOUNTER — Encounter: Payer: Self-pay | Admitting: Family Medicine

## 2013-12-27 VITALS — BP 152/105 | HR 93 | Temp 97.8°F | Ht 65.5 in | Wt 197.0 lb

## 2013-12-27 DIAGNOSIS — E7800 Pure hypercholesterolemia, unspecified: Secondary | ICD-10-CM

## 2013-12-27 DIAGNOSIS — I10 Essential (primary) hypertension: Secondary | ICD-10-CM

## 2013-12-27 DIAGNOSIS — IMO0002 Reserved for concepts with insufficient information to code with codable children: Secondary | ICD-10-CM

## 2013-12-27 DIAGNOSIS — R7309 Other abnormal glucose: Secondary | ICD-10-CM

## 2013-12-27 DIAGNOSIS — M25579 Pain in unspecified ankle and joints of unspecified foot: Secondary | ICD-10-CM

## 2013-12-27 DIAGNOSIS — E039 Hypothyroidism, unspecified: Secondary | ICD-10-CM

## 2013-12-27 LAB — LIPID PANEL
Cholesterol: 207 mg/dL — ABNORMAL HIGH (ref 0–200)
HDL: 47 mg/dL (ref >39)
LDL CALC: 124 mg/dL — AB (ref 0–99)
TRIGLYCERIDES: 180 mg/dL — AB (ref <150)
Total CHOL/HDL Ratio: 4.4 Ratio
VLDL: 36 mg/dL (ref 0–40)

## 2013-12-27 LAB — COMPLETE METABOLIC PANEL WITH GFR
ALBUMIN: 4.4 g/dL (ref 3.5–5.2)
ALT: 31 U/L (ref 0–35)
AST: 24 U/L (ref 0–37)
Alkaline Phosphatase: 90 U/L (ref 39–117)
BUN: 11 mg/dL (ref 6–23)
CHLORIDE: 103 meq/L (ref 96–112)
CO2: 29 meq/L (ref 19–32)
CREATININE: 0.53 mg/dL (ref 0.50–1.10)
Calcium: 10.6 mg/dL — ABNORMAL HIGH (ref 8.4–10.5)
GFR, Est Non African American: >89 mL/min
Glucose, Bld: 115 mg/dL — ABNORMAL HIGH (ref 70–99)
POTASSIUM: 5 meq/L (ref 3.5–5.3)
Sodium: 140 mEq/L (ref 135–145)
Total Bilirubin: 0.4 mg/dL (ref 0.2–1.2)
Total Protein: 6.8 g/dL (ref 6.0–8.3)

## 2013-12-27 LAB — CBC
HCT: 41.4 % (ref 36.0–46.0)
HEMOGLOBIN: 14.1 g/dL (ref 12.0–15.0)
MCH: 30 pg (ref 26.0–34.0)
MCHC: 34.1 g/dL (ref 30.0–36.0)
MCV: 88.1 fL (ref 78.0–100.0)
PLATELETS: 355 10*3/uL (ref 150–400)
RBC: 4.7 MIL/uL (ref 3.87–5.11)
RDW: 13.6 % (ref 11.5–15.5)
WBC: 8.8 10*3/uL (ref 4.0–10.5)

## 2013-12-27 LAB — URIC ACID: Uric Acid, Serum: 5.3 mg/dL (ref 2.4–7.0)

## 2013-12-27 LAB — POCT GLYCOSYLATED HEMOGLOBIN (HGB A1C): Hemoglobin A1C: 6.4

## 2013-12-27 LAB — TSH: TSH: 12.384 u[IU]/mL — ABNORMAL HIGH (ref 0.350–4.500)

## 2013-12-27 MED ORDER — METOPROLOL SUCCINATE ER 200 MG PO TB24: 200.0000 mg | ORAL_TABLET | Freq: Every day | ORAL | Status: DC

## 2013-12-27 NOTE — Assessment & Plan Note (Signed)
Recheck A1C with weight up and possible foot neuropathy.

## 2013-12-27 NOTE — Patient Instructions (Signed)
My nurse will call about the podiatry referral.  I will call with blood work results. I increased your metoprolol to 200 mg daily and sent in a new prescription for that dose.  You can use up your current supply by taking two pills a day.   I need to see you in one month for blood pressure recheck. Weight loss is the long term key.  Water aerobics is probably the best exercise for you.

## 2013-12-27 NOTE — Assessment & Plan Note (Addendum)
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  Check labs and refer to podiatry.  Check labs for neuropathy and gout.

## 2013-12-27 NOTE — Assessment & Plan Note (Signed)
Recheck lipids

## 2013-12-27 NOTE — Progress Notes (Signed)
   Subjective:    Patient ID: Danielle [name], female    DOB: 1956-06-05, 58 y.o.   MRN: [id number]  HPI Danielle [name] is not thriving.  Issues 1. 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  That has resolved but she has not been as active for the past month. 2. Neck is bothering her more - but that is not the big problem 3. Big problem is worsening back pain and bilateral groin pain.  S/P multi level fusion.  Seen by ortho and repeat myelogram. Groin pain corresponds with L2 nerve root (thecal sac) compression. 4. Bilateral foot pain severe.  Pain in metatarsal region and at ankle.   5. States taking BP meds    Review of Systems     Objective:   Physical ExamElevated BP confirmed.  Elevated wt noted. Lungs clear Cardiac RRR without m or g Feet. Good pulses Pain is mostly at metatarsal heads.  Some loss of arch.        Assessment & Plan:

## 2013-12-27 NOTE — Assessment & Plan Note (Signed)
Currently managed by ortho.  Explained that long term best hope is significant weight loss and keeping muscles fit.  Rec water aerobics

## 2013-12-27 NOTE — Assessment & Plan Note (Signed)
Poor control.  Increase metoprolol

## 2013-12-27 NOTE — Assessment & Plan Note (Addendum)
Recheck.  TSH TSH elevated.  Will increase levothyroxine.  Recheck BP in one month.

## 2013-12-30 MED ORDER — LEVOTHYROXINE SODIUM 200 MCG PO TABS: 200.0000 ug | ORAL_TABLET | Freq: Every day | ORAL | Status: DC

## 2013-12-30 NOTE — Addendum Note (Signed)
Addended by: [name] on: 12/30/2013 09:24 AM   Modules accepted: Orders, Medications

## 2014-01-03 ENCOUNTER — Encounter: Payer: Self-pay | Admitting: Podiatrist

## 2014-01-03 ENCOUNTER — Ambulatory Visit (INDEPENDENT_AMBULATORY_CARE_PROVIDER_SITE_OTHER): Payer: Medicare Other | Admitting: Podiatrist

## 2014-01-03 ENCOUNTER — Ambulatory Visit (INDEPENDENT_AMBULATORY_CARE_PROVIDER_SITE_OTHER): Payer: Medicare Other

## 2014-01-03 ENCOUNTER — Ambulatory Visit: Payer: Medicare Other | Admitting: Podiatrist

## 2014-01-03 VITALS — BP 120/83 | HR 75 | Resp 18

## 2014-01-03 DIAGNOSIS — M7742 Metatarsalgia, left foot: Secondary | ICD-10-CM

## 2014-01-03 DIAGNOSIS — R52 Pain, unspecified: Secondary | ICD-10-CM

## 2014-01-03 DIAGNOSIS — M722 Plantar fascial fibromatosis: Secondary | ICD-10-CM

## 2014-01-03 DIAGNOSIS — M7741 Metatarsalgia, right foot: Secondary | ICD-10-CM

## 2014-01-03 DIAGNOSIS — M7750 Other enthesopathy of unspecified foot: Secondary | ICD-10-CM

## 2014-01-03 MED ORDER — TRIAMCINOLONE ACETONIDE 40 MG/ML IJ SUSP
20.0000 mg | Freq: Once | INTRAMUSCULAR | Status: AC
  Administered 2014-01-03: 20 mg

## 2014-01-03 NOTE — Progress Notes (Signed)
   Subjective:    Patient ID: Danielle [name], female    DOB: 05/24/1956, 58 y.o.   MRN: [id number]  HPI my feet have been bothering me for about a year and they hurt on the ball of both my feet and the toes feel like they are going to blow out and stinging and have had 11 back surgeries and burns and throbs and numbness and tingling and some swelling and they turn purplish and red color and I saw  Dr Lenice Llamas about a year ago and all he said it was plantar fasciitis, then I saw Dr. Rolene Course and he gave me an insert for my right foot and made it worse and now they will not quit hurting and I went to the [facility] about a year ago and the two places on my feet look like ring worm but they said it wasn't and they gave me shots in them    Review of Systems  Constitutional: Positive for activity change and fatigue.  HENT: Positive for ear pain.   Genitourinary: Positive for frequency.  Musculoskeletal: Positive for back pain and gait problem.  All other systems reviewed and are negative.       Objective:   Physical Exam GENERAL APPEARANCE: Alert, conversant. Appropriately groomed. No acute distress.  VASCULAR: Pedal pulses palpable and strong bilateral.  Capillary refill time is immediate to all digits,  Proximal to distal cooling it warm to warm.  Digital hair growth is present bilateral  NEUROLOGIC: sensation is intact epicritically and protectively to 5.07 monofilament at 5/5 sites bilateral.  Light touch is intact but blunted bilateral, vibratory sensation intact bilateral, Positive Tinel sign is elicited bilateral MUSCULOSKELETAL: Pain on palpation plantar medial aspect both feet at insertion of plantar fascia on the medial calcaneal tubercle. Inflammation at the insertion of the plantar fascia is present. High arched foot type is seen. Pain at the metatarsal heads bilaterally is noted.  Pain on the dorsal lateral side of the foot also present.  DERMATOLOGIC: skin color, texture, and  turger are within normal limits. Circular- reddish type lesion present on the dorsum of the right foot and less so on the left foot.  Raised border with no itching or irritation involved.  Patient saw [facility] dermatology about it and they injected something into it which made it go away-- it is now returned.    Assessment & Plan:  Plantar fasciitis bilateral, nerve impingement from previous back issues, tarsal tunnel, metatarsalgia  Plan: Discussed etiology, pathology, conservative vs. Surgical therapies and at this time a plantar fascial injection was recommended.  The patient agreed and a sterile skin prep was applied.  An injection consisting of kenalog and marcaine mixture was infiltrated at the point of maximal tenderness on the bilateral Heels.  The patient tolerated this well and was given instructions for aftercare. i will see her back in 3 weeks for follow up and possible injections at the metatarsal region for pain in this area.  May consider an orthotic as well.

## 2014-01-06 ENCOUNTER — Ambulatory Visit
Admission: RE | Admit: 2014-01-06 | Discharge: 2014-01-06 | Disposition: A | Discharge status: Home / Self Care | Payer: Medicare Other | Source: Ambulatory Visit | Attending: Specialist | Admitting: Specialist

## 2014-01-06 DIAGNOSIS — E2839 Other primary ovarian failure: Secondary | ICD-10-CM

## 2014-01-13 ENCOUNTER — Ambulatory Visit: Payer: Medicare Other | Admitting: Neurology

## 2014-01-13 ENCOUNTER — Telehealth: Payer: Self-pay | Admitting: Neurology

## 2014-01-13 NOTE — Telephone Encounter (Signed)
This patient did not show for the revisit appointment today. 

## 2014-01-22 ENCOUNTER — Encounter: Payer: Self-pay | Admitting: Family Medicine

## 2014-01-22 DIAGNOSIS — M8580 Other specified disorders of bone density and structure, unspecified site: Secondary | ICD-10-CM | POA: Insufficient documentation

## 2014-01-24 ENCOUNTER — Ambulatory Visit: Payer: Medicare Other | Admitting: Podiatrist

## 2014-01-24 ENCOUNTER — Ambulatory Visit: Payer: Medicare Other | Admitting: Family Medicine

## 2014-02-12 ENCOUNTER — Ambulatory Visit (INDEPENDENT_AMBULATORY_CARE_PROVIDER_SITE_OTHER): Payer: Medicare Other | Admitting: Podiatrist

## 2014-02-12 ENCOUNTER — Encounter: Payer: Self-pay | Admitting: Podiatrist

## 2014-02-12 VITALS — BP 120/77 | HR 93 | Resp 16 | Ht 66.0 in | Wt 194.0 lb

## 2014-02-12 DIAGNOSIS — M722 Plantar fascial fibromatosis: Secondary | ICD-10-CM

## 2014-02-12 DIAGNOSIS — L923 Foreign body granuloma of the skin and subcutaneous tissue: Secondary | ICD-10-CM

## 2014-02-12 NOTE — Progress Notes (Signed)
   Subjective:    Patient ID: Danielle [name], female    DOB: 05-06-1956, 58 y.o.   MRN: [id number]  HPI patient presents at today's visit for followup of plantar fasciitis. She states that the shots helped significantly and it's not really bothering her anymore. She does state she has a dark spot on the bottom of her right foot at the fourth metatarsal head region. She's had it for one week and doesn't recall any injury. She states it's black and tenderness around as well.   N dark spot                           L right plantar sub 4th MPJ midfoot                           D 1 week                O no incident                           C black speck with tenderness                           A to touch                           T H202, and picking at it   Review of Systems     Objective:   Physical Exam  Neurovascular status is unchanged. Plantar fasciitis has improved bilaterally. She does have a well-circumscribed lesion on the plantar aspect of the right foot which was debridement gently was a foreign body within the skin. It was removed easily. It appears to be a splinter.      Assessment & Plan:  Foreign body of skin, plantar fasciitis bilateral  Remove the superficial foreign body with a 15 blade without complication. I'll see her back if she would like another injection for plantar fasciitis however at today's visit she is improving and doing well. She will call with any problems or concerns.

## 2014-04-23 ENCOUNTER — Telehealth: Payer: Self-pay | Admitting: Family Medicine

## 2014-04-23 NOTE — Telephone Encounter (Signed)
Need rx for nyastatin for oral thrush.  Please call in to pharmacy

## 2014-04-24 ENCOUNTER — Other Ambulatory Visit: Payer: Self-pay | Admitting: Family Medicine

## 2014-04-24 MED ORDER — NYSTATIN 100000 UNIT/ML MT SUSP: 5.0000 mL | Freq: Four times a day (QID) | OROMUCOSAL | Status: DC

## 2014-04-24 NOTE — Telephone Encounter (Signed)
Done and patient informed.

## 2014-06-27 ENCOUNTER — Other Ambulatory Visit (HOSPITAL_COMMUNITY): Payer: Self-pay | Admitting: Specialist

## 2014-06-27 ENCOUNTER — Encounter (HOSPITAL_COMMUNITY): Payer: Self-pay | Admitting: Pharmacy Technician

## 2014-06-27 NOTE — H&P (Signed)
Danielle [name] is an 58 y.o. female.   Chief Complaint: neck and right arm pain HPI: complaining of persistent neck pain and some radiation into the right upper extremity into the right elbow.  She has been experiencing pain and discomfort for over a year.  Has been seen by neurology, who feels as though her problem is related to right-sided cervical spondylosis changes.  She has been experiencing paresthesias into the right upper extremity.  EMG/nerve conduction studies have not been very beneficial in determining the muscle cause or a peripheral neuropathy.The most recent MRI scan just obtained and this study is done on 06/06/2014.  Shows she has a disk osteophyte complex on the right, right worse than left uncovertebral disease and this deforms the right hemicord and causes marked right-sided foraminal narrowing at C5-6.  At C6-7 bilateral uncovertebral disease, moderate biforaminal narrowing is present.     She has been through a therapy program in the past, cervical traction, using over the door traction unit presently and it is not relieving her pain to any large extent.  She describes problems with headaches along the right side.   Past Medical History  Diagnosis Date  . Hypertension   . Glucose intolerance (pre-diabetes)   . History of tobacco abuse   . History of hiatal hernia   . Tachycardia   . 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 Obesity   . Dyslipidemia   . Cervical spondylosis without myelopathy 09/12/2013  . Complication of anesthesia   . PONV (postoperative nausea and vomiting)   . Family history of anesthesia complication     Mother - N/V  . Oral thrush     after back surgeries  . Heart murmur     nothing to be concerned  . Stroke     TIAs- 2013last one  . Hypothyroidism   . Kidney stone   . Gastroesophageal reflux disease     8./25/15- no problem  . History of blood transfusion   . Diverticulitis     Past Surgical History  Procedure Laterality Date  . Abdominal  hysterectomy    . Cholecystectomy    . Tonsillectomy    . Appendectomy    . Cystoscopy w/ stone manipulation    . Cystoscopy w/ ureteral stent placement    . Back surgery      10 spine surgeries since 1988.    Family History  Problem Relation Age of Onset  . Heart attack Father   . Cancer Father   . Diabetes Father   . Heart attack Brother   . Cancer Other   . Coronary artery disease Other   . Stroke Other   . Diabetes Other   . Hypertension Other   . Heart failure Other   . Stroke Mother   . Diabetes Mother   . Cancer Mother    Social History:  reports that she has been smoking Cigarettes.  She has a 21.5 pack-year smoking history. She has never used smokeless tobacco. She reports that she does not drink alcohol or use illicit drugs.  Allergies:  Allergies  Allergen Reactions  . Cephalexin Other (See Comments)    burns stomach  . Codeine Hives and Nausea And Vomiting    Medications Prior to Admission  Medication Sig Dispense Refill  . cholecalciferol (VITAMIN D) 1000 UNITS tablet Take 1,000 Units by mouth daily.      Marland Kitchen HYDROcodone-acetaminophen (NORCO) 7.5-325 MG per tablet Take 7.5-325 tablets by mouth every  6 (six) hours as needed (pain).       Marland Kitchen ibuprofen (ADVIL,MOTRIN) 800 MG tablet Take 800 mg by mouth as needed for headache (pain).       Marland Kitchen levothyroxine (SYNTHROID) 200 MCG tablet Take 1 tablet (200 mcg total) by mouth daily before breakfast.  90 tablet  3    Results for orders placed during the hospital encounter of 07/01/14 (from the past 48 hour(s))  CBC     Status: Abnormal   Collection Time    06/30/14  3:47 PM      Result Value Ref Range   WBC 11.5 (*) 4.0 - 10.5 K/uL   RBC 4.77  3.87 - 5.11 MIL/uL   Hemoglobin 14.2  12.0 - 15.0 g/dL   HCT 90.3  41.8 - 88.5 %   MCV 89.9  78.0 - 100.0 fL   MCH 29.8  26.0 - 34.0 pg   MCHC 33.1  30.0 - 36.0 g/dL   RDW 41.8  25.8 - 27.1 %   Platelets 308  150 - 400 K/uL  BASIC METABOLIC PANEL     Status: Abnormal    Collection Time    06/30/14  3:47 PM      Result Value Ref Range   Sodium 140  137 - 147 mEq/L   Potassium 4.3  3.7 - 5.3 mEq/L   Chloride 103  96 - 112 mEq/L   CO2 26  19 - 32 mEq/L   Glucose, Bld 138 (*) 70 - 99 mg/dL   BUN 16  6 - 23 mg/dL   Creatinine, Ser 4.71  0.50 - 1.10 mg/dL   Calcium 57.8 (*) 8.4 - 10.5 mg/dL   GFR calc non Af Amer >90  >90 mL/min   GFR calc Af Amer >90  >90 mL/min   Comment: (NOTE)     The eGFR has been calculated using the CKD EPI equation.     This calculation has not been validated in all clinical situations.     eGFR's persistently <90 mL/min signify possible Chronic Kidney     Disease.   Anion gap 11  5 - 15   No results found.  Review of Systems  Musculoskeletal: Positive for back pain, myalgias and neck pain.  Neurological: Positive for headaches.  All other systems reviewed and are negative.   Blood pressure 142/83, pulse 95, temperature 98.3 F (36.8 C), temperature source Oral, resp. rate 20, weight 88.451 kg (195 lb), SpO2 97.00%. Physical Exam  Constitutional: She is oriented to person, place, and time. She appears well-developed and well-nourished.  HENT:  Head: Normocephalic and atraumatic.  Eyes: EOM are normal. Pupils are equal, round, and reactive to light.  Cardiovascular: Normal rate and regular rhythm.   Respiratory: Effort normal and breath sounds normal.  GI: Soft.  Musculoskeletal:   Her clinical exam shows that she is having pain radiating into the right upper extremity and into her right hand.  Discomfort to some extent is into the ulnar digits of the right hand.  She has had mild cervical stenosis on the right at C6-7 and no stenosis at C7-T1.  Her exam shows she has pain when she extends the neck, lateral bending and lateral rotation are uncomfortable as well.  Today's exam shows that she does have some weakness in right finger extension.  She has some very slight triceps weakness on the right.  Wrist dorsiflexion/volar  flexion appear intact.  Her pronation strength on the right slightly decreased compared with the  left.    Neurological: She is alert and oriented to person, place, and time.  Skin: Skin is warm and dry.     Assessment/Plan Right C5-6 and right C6-7 spondylosis  PLAN:  Right C5-6 and C6-7 foraminotomies.  Weylyn Ricciuti E 07/01/2014, 1:14 PM

## 2014-06-30 ENCOUNTER — Encounter (HOSPITAL_COMMUNITY): Payer: Self-pay

## 2014-06-30 ENCOUNTER — Encounter (HOSPITAL_COMMUNITY)
Admission: RE | Admit: 2014-06-30 | Discharge: 2014-06-30 | Disposition: A | Discharge status: Home / Self Care | Payer: Medicare Other | Source: Ambulatory Visit | Attending: Specialist | Admitting: Specialist

## 2014-06-30 DIAGNOSIS — Z888 Allergy status to other drugs, medicaments and biological substances status: Secondary | ICD-10-CM | POA: Diagnosis not present

## 2014-06-30 DIAGNOSIS — Z8719 Personal history of other diseases of the digestive system: Secondary | ICD-10-CM | POA: Diagnosis not present

## 2014-06-30 DIAGNOSIS — I10 Essential (primary) hypertension: Secondary | ICD-10-CM | POA: Diagnosis not present

## 2014-06-30 DIAGNOSIS — M47812 Spondylosis without myelopathy or radiculopathy, cervical region: Secondary | ICD-10-CM | POA: Diagnosis present

## 2014-06-30 DIAGNOSIS — E785 Hyperlipidemia, unspecified: Secondary | ICD-10-CM | POA: Diagnosis not present

## 2014-06-30 DIAGNOSIS — Z885 Allergy status to narcotic agent status: Secondary | ICD-10-CM | POA: Diagnosis not present

## 2014-06-30 DIAGNOSIS — Z6832 Body mass index (BMI) 32.0-32.9, adult: Secondary | ICD-10-CM | POA: Diagnosis not present

## 2014-06-30 DIAGNOSIS — E739 Lactose intolerance, unspecified: Secondary | ICD-10-CM | POA: Diagnosis not present

## 2014-06-30 DIAGNOSIS — I69928 Other speech and language deficits following unspecified cerebrovascular disease: Secondary | ICD-10-CM | POA: Diagnosis not present

## 2014-06-30 DIAGNOSIS — E669 Obesity, unspecified: Secondary | ICD-10-CM | POA: Diagnosis not present

## 2014-06-30 DIAGNOSIS — E039 Hypothyroidism, unspecified: Secondary | ICD-10-CM | POA: Diagnosis not present

## 2014-06-30 DIAGNOSIS — K219 Gastro-esophageal reflux disease without esophagitis: Secondary | ICD-10-CM | POA: Diagnosis not present

## 2014-06-30 DIAGNOSIS — R011 Cardiac murmur, unspecified: Secondary | ICD-10-CM | POA: Diagnosis not present

## 2014-06-30 HISTORY — DX: Other specified postprocedural states: Z98.890

## 2014-06-30 HISTORY — DX: Diverticulitis of intestine, part unspecified, without perforation or abscess without bleeding: K57.92

## 2014-06-30 HISTORY — DX: Candidal stomatitis: B37.0

## 2014-06-30 HISTORY — DX: Adverse effect of unspecified anesthetic, initial encounter: T41.45XA

## 2014-06-30 HISTORY — DX: Hypothyroidism, unspecified: E03.9

## 2014-06-30 HISTORY — DX: Calculus of kidney: N20.0

## 2014-06-30 HISTORY — DX: Family history of other specified conditions: Z84.89

## 2014-06-30 HISTORY — DX: Cardiac murmur, unspecified: R01.1

## 2014-06-30 HISTORY — DX: Other specified postprocedural states: R11.2

## 2014-06-30 HISTORY — DX: Other complications of anesthesia, initial encounter: T88.59XA

## 2014-06-30 HISTORY — DX: Personal history of other medical treatment: Z92.89

## 2014-06-30 LAB — BASIC METABOLIC PANEL WITH GFR
Anion gap: 11 (ref 5–15)
BUN: 16 mg/dL (ref 6–23)
CO2: 26 meq/L (ref 19–32)
Calcium: 10.8 mg/dL — ABNORMAL HIGH (ref 8.4–10.5)
Chloride: 103 meq/L (ref 96–112)
Creatinine, Ser: 0.58 mg/dL (ref 0.50–1.10)
GFR calc Af Amer: >90 mL/min
GFR calc non Af Amer: >90 mL/min
Glucose, Bld: 138 mg/dL — ABNORMAL HIGH (ref 70–99)
Potassium: 4.3 meq/L (ref 3.7–5.3)
Sodium: 140 meq/L (ref 137–147)

## 2014-06-30 LAB — CBC
HCT: 42.9 % (ref 36.0–46.0)
Hemoglobin: 14.2 g/dL (ref 12.0–15.0)
MCH: 29.8 pg (ref 26.0–34.0)
MCHC: 33.1 g/dL (ref 30.0–36.0)
MCV: 89.9 fL (ref 78.0–100.0)
Platelets: 308 K/uL (ref 150–400)
RBC: 4.77 MIL/uL (ref 3.87–5.11)
RDW: 13.4 % (ref 11.5–15.5)
WBC: 11.5 K/uL — ABNORMAL HIGH (ref 4.0–10.5)

## 2014-06-30 NOTE — Pre-Procedure Instructions (Signed)
Danielle [name]  06/30/2014   Your procedure is scheduled on:  Tuesday, August 25.  Report to [facility] Admitting at 11:30 AM.  Call this number if you have problems the morning of surgery: [phone]   Remember:   Do not eat food or drink liquids after midnight.   Take these medicines the morning of surgery with A SIP OF WATER: levothyroxine (SYNTHROID).              May take  HYDROcodone-acetaminophen [facility]) if needed.               Stop taking ibuprofen (ADVIL,MOTRIN).     Do not wear jewelry, make-up or nail polish.  Do not wear lotions, powders, or perfumes.   Do not shave 48 hours prior to surgery.   Do not bring valuables to the hospital.              [facility] is not responsible for any belongings or valuables.               Contacts, dentures or bridgework may not be worn into surgery.  Leave suitcase in the car. After surgery it may be brought to your room.  For patients admitted to the hospital, discharge time is determined by your treatment team.               Patients discharged the day of surgery will not be allowed to drive home.  Name and phone number of your driver: -   Special Instructions: Review  [facility] - Preparing For Surgery.   Please read over the following fact sheets that you were given: Pain Booklet, Coughing and Deep Breathing and Surgical Site Infection Prevention

## 2014-07-01 ENCOUNTER — Encounter (HOSPITAL_COMMUNITY): Payer: Medicare Other | Admitting: Certified Registered Nurse Anesthetist

## 2014-07-01 ENCOUNTER — Ambulatory Visit (HOSPITAL_COMMUNITY): Payer: Medicare Other

## 2014-07-01 ENCOUNTER — Ambulatory Visit (HOSPITAL_COMMUNITY)
Admission: RE | Admit: 2014-07-01 | Discharge: 2014-07-02 | Disposition: A | Discharge status: Home / Self Care | Payer: Medicare Other | Source: Ambulatory Visit | Attending: Specialist | Admitting: Specialist

## 2014-07-01 ENCOUNTER — Encounter (HOSPITAL_COMMUNITY): Payer: Self-pay | Admitting: Certified Registered Nurse Anesthetist

## 2014-07-01 ENCOUNTER — Encounter (HOSPITAL_COMMUNITY)
Admission: RE | Disposition: A | Discharge status: Home / Self Care | Payer: Self-pay | Source: Ambulatory Visit | Attending: Specialist

## 2014-07-01 ENCOUNTER — Ambulatory Visit (HOSPITAL_COMMUNITY): Payer: Medicare Other | Admitting: Certified Registered Nurse Anesthetist

## 2014-07-01 DIAGNOSIS — Z888 Allergy status to other drugs, medicaments and biological substances status: Secondary | ICD-10-CM | POA: Insufficient documentation

## 2014-07-01 DIAGNOSIS — E739 Lactose intolerance, unspecified: Secondary | ICD-10-CM | POA: Diagnosis not present

## 2014-07-01 DIAGNOSIS — I10 Essential (primary) hypertension: Secondary | ICD-10-CM | POA: Insufficient documentation

## 2014-07-01 DIAGNOSIS — R011 Cardiac murmur, unspecified: Secondary | ICD-10-CM | POA: Insufficient documentation

## 2014-07-01 DIAGNOSIS — Z885 Allergy status to narcotic agent status: Secondary | ICD-10-CM | POA: Insufficient documentation

## 2014-07-01 DIAGNOSIS — E039 Hypothyroidism, unspecified: Secondary | ICD-10-CM | POA: Insufficient documentation

## 2014-07-01 DIAGNOSIS — E785 Hyperlipidemia, unspecified: Secondary | ICD-10-CM | POA: Insufficient documentation

## 2014-07-01 DIAGNOSIS — K219 Gastro-esophageal reflux disease without esophagitis: Secondary | ICD-10-CM | POA: Insufficient documentation

## 2014-07-01 DIAGNOSIS — M47812 Spondylosis without myelopathy or radiculopathy, cervical region: Secondary | ICD-10-CM | POA: Diagnosis not present

## 2014-07-01 DIAGNOSIS — I69928 Other speech and language deficits following unspecified cerebrovascular disease: Secondary | ICD-10-CM | POA: Insufficient documentation

## 2014-07-01 DIAGNOSIS — Z6832 Body mass index (BMI) 32.0-32.9, adult: Secondary | ICD-10-CM | POA: Insufficient documentation

## 2014-07-01 DIAGNOSIS — Z8719 Personal history of other diseases of the digestive system: Secondary | ICD-10-CM | POA: Insufficient documentation

## 2014-07-01 DIAGNOSIS — M4802 Spinal stenosis, cervical region: Secondary | ICD-10-CM | POA: Diagnosis present

## 2014-07-01 DIAGNOSIS — E669 Obesity, unspecified: Secondary | ICD-10-CM | POA: Insufficient documentation

## 2014-07-01 HISTORY — PX: POSTERIOR CERVICAL FUSION/FORAMINOTOMY: SHX5038

## 2014-07-01 LAB — GLUCOSE, CAPILLARY: GLUCOSE-CAPILLARY: 150 mg/dL — AB (ref 70–99)

## 2014-07-01 LAB — SURGICAL PCR SCREEN
MRSA, PCR: NEGATIVE
Staphylococcus aureus: NEGATIVE

## 2014-07-01 SURGERY — POSTERIOR CERVICAL FUSION/FORAMINOTOMY LEVEL 2
Anesthesia: General

## 2014-07-01 MED ORDER — METHOCARBAMOL 500 MG PO TABS: 500.0000 mg | ORAL_TABLET | Freq: Four times a day (QID) | ORAL | Status: DC | PRN

## 2014-07-01 MED ORDER — PANTOPRAZOLE SODIUM 40 MG IV SOLR
40.0000 mg | Freq: Every day | INTRAVENOUS | Status: DC
  Filled 2014-07-01 (×2): qty 40

## 2014-07-01 MED ORDER — MORPHINE SULFATE 2 MG/ML IJ SOLN: 1.0000 mg | INTRAMUSCULAR | Status: DC | PRN

## 2014-07-01 MED ORDER — METHOCARBAMOL 1000 MG/10ML IJ SOLN
500.0000 mg | Freq: Four times a day (QID) | INTRAVENOUS | Status: DC | PRN
  Filled 2014-07-01: qty 5

## 2014-07-01 MED ORDER — GLYCOPYRROLATE 0.2 MG/ML IJ SOLN
INTRAMUSCULAR | Status: DC | PRN
  Administered 2014-07-01: 0.2 mg via INTRAVENOUS

## 2014-07-01 MED ORDER — ACETAMINOPHEN 650 MG RE SUPP: 650.0000 mg | RECTAL | Status: DC | PRN

## 2014-07-01 MED ORDER — PHENYLEPHRINE HCL 10 MG/ML IJ SOLN
10.0000 mg | INTRAVENOUS | Status: DC | PRN
  Administered 2014-07-01: 40 ug/min via INTRAVENOUS

## 2014-07-01 MED ORDER — CLINDAMYCIN PHOSPHATE 900 MG/50ML IV SOLN
INTRAVENOUS | Status: DC | PRN
  Administered 2014-07-01: 900 mg via INTRAVENOUS

## 2014-07-01 MED ORDER — BISACODYL 5 MG PO TBEC: 5.0000 mg | DELAYED_RELEASE_TABLET | Freq: Every day | ORAL | Status: DC | PRN

## 2014-07-01 MED ORDER — CEFAZOLIN SODIUM 1-5 GM-% IV SOLN
1.0000 g | Freq: Three times a day (TID) | INTRAVENOUS | Status: AC
  Administered 2014-07-01 – 2014-07-02 (×2): 1 g via INTRAVENOUS
  Filled 2014-07-01 (×2): qty 50

## 2014-07-01 MED ORDER — LIDOCAINE HCL 4 % MT SOLN
OROMUCOSAL | Status: DC | PRN
  Administered 2014-07-01: 4 mL via TOPICAL

## 2014-07-01 MED ORDER — ONDANSETRON HCL 4 MG/2ML IJ SOLN
INTRAMUSCULAR | Status: AC
  Filled 2014-07-01: qty 2

## 2014-07-01 MED ORDER — FENTANYL CITRATE 0.05 MG/ML IJ SOLN
INTRAMUSCULAR | Status: AC
  Filled 2014-07-01: qty 5

## 2014-07-01 MED ORDER — MUPIROCIN 2 % EX OINT
TOPICAL_OINTMENT | Freq: Once | CUTANEOUS | Status: DC
  Filled 2014-07-01: qty 22

## 2014-07-01 MED ORDER — PROPOFOL 10 MG/ML IV BOLUS
INTRAVENOUS | Status: AC
  Filled 2014-07-01: qty 20

## 2014-07-01 MED ORDER — KCL IN DEXTROSE-NACL 20-5-0.45 MEQ/L-%-% IV SOLN
INTRAVENOUS | Status: DC
  Administered 2014-07-02: 06:00:00 via INTRAVENOUS
  Filled 2014-07-01 (×2): qty 1000

## 2014-07-01 MED ORDER — SENNA 8.6 MG PO TABS
1.0000 | ORAL_TABLET | Freq: Two times a day (BID) | ORAL | Status: DC
  Administered 2014-07-02: 8.6 mg via ORAL
  Filled 2014-07-01 (×3): qty 1

## 2014-07-01 MED ORDER — ACETAMINOPHEN 325 MG PO TABS: 650.0000 mg | ORAL_TABLET | ORAL | Status: DC | PRN

## 2014-07-01 MED ORDER — PHENYLEPHRINE HCL 10 MG/ML IJ SOLN
INTRAMUSCULAR | Status: DC | PRN
  Administered 2014-07-01: 120 ug via INTRAVENOUS
  Administered 2014-07-01: 80 ug via INTRAVENOUS
  Administered 2014-07-01 (×2): 120 ug via INTRAVENOUS

## 2014-07-01 MED ORDER — DEXAMETHASONE SODIUM PHOSPHATE 10 MG/ML IJ SOLN
INTRAMUSCULAR | Status: DC | PRN
  Administered 2014-07-01: 10 mg via INTRAVENOUS

## 2014-07-01 MED ORDER — KETOROLAC TROMETHAMINE 30 MG/ML IJ SOLN
30.0000 mg | Freq: Once | INTRAMUSCULAR | Status: DC
  Filled 2014-07-01: qty 1

## 2014-07-01 MED ORDER — DOCUSATE SODIUM 100 MG PO CAPS
100.0000 mg | ORAL_CAPSULE | Freq: Two times a day (BID) | ORAL | Status: DC
  Administered 2014-07-02: 100 mg via ORAL
  Filled 2014-07-01 (×2): qty 1

## 2014-07-01 MED ORDER — LEVOTHYROXINE SODIUM 200 MCG PO TABS
200.0000 ug | ORAL_TABLET | Freq: Every day | ORAL | Status: DC
  Administered 2014-07-02: 200 ug via ORAL
  Filled 2014-07-01 (×2): qty 1

## 2014-07-01 MED ORDER — PHENYLEPHRINE 40 MCG/ML (10ML) SYRINGE FOR IV PUSH (FOR BLOOD PRESSURE SUPPORT)
PREFILLED_SYRINGE | INTRAVENOUS | Status: AC
  Filled 2014-07-01: qty 20

## 2014-07-01 MED ORDER — CLINDAMYCIN PHOSPHATE 900 MG/50ML IV SOLN
900.0000 mg | INTRAVENOUS | Status: DC
  Filled 2014-07-01: qty 50

## 2014-07-01 MED ORDER — PHENOL 1.4 % MT LIQD: 1.0000 | OROMUCOSAL | Status: DC | PRN

## 2014-07-01 MED ORDER — HYDROMORPHONE HCL PF 1 MG/ML IJ SOLN: 0.2500 mg | INTRAMUSCULAR | Status: DC | PRN

## 2014-07-01 MED ORDER — SODIUM CHLORIDE 0.9 % IJ SOLN: 3.0000 mL | INTRAMUSCULAR | Status: DC | PRN

## 2014-07-01 MED ORDER — SODIUM CHLORIDE 0.9 % IJ SOLN: 3.0000 mL | Freq: Two times a day (BID) | INTRAMUSCULAR | Status: DC

## 2014-07-01 MED ORDER — NEOSTIGMINE METHYLSULFATE 10 MG/10ML IV SOLN
INTRAVENOUS | Status: DC | PRN
  Administered 2014-07-01: 2 mg via INTRAVENOUS

## 2014-07-01 MED ORDER — MUPIROCIN 2 % EX OINT
TOPICAL_OINTMENT | CUTANEOUS | Status: AC
Start: 2014-07-01 — End: 2014-07-02
  Filled 2014-07-01: qty 22

## 2014-07-01 MED ORDER — ONDANSETRON HCL 4 MG/2ML IJ SOLN
4.0000 mg | INTRAMUSCULAR | Status: DC | PRN
  Administered 2014-07-01 – 2014-07-02 (×2): 4 mg via INTRAVENOUS
  Filled 2014-07-01 (×2): qty 2

## 2014-07-01 MED ORDER — LIDOCAINE HCL (CARDIAC) 20 MG/ML IV SOLN
INTRAVENOUS | Status: AC
  Filled 2014-07-01: qty 5

## 2014-07-01 MED ORDER — ROCURONIUM BROMIDE 100 MG/10ML IV SOLN
INTRAVENOUS | Status: DC | PRN
  Administered 2014-07-01: 50 mg via INTRAVENOUS

## 2014-07-01 MED ORDER — THROMBIN 20000 UNITS EX SOLR
CUTANEOUS | Status: AC
  Filled 2014-07-01: qty 20000

## 2014-07-01 MED ORDER — MENTHOL 3 MG MT LOZG: 1.0000 | LOZENGE | OROMUCOSAL | Status: DC | PRN

## 2014-07-01 MED ORDER — FENTANYL CITRATE 0.05 MG/ML IJ SOLN
INTRAMUSCULAR | Status: DC | PRN
  Administered 2014-07-01: 50 ug via INTRAVENOUS
  Administered 2014-07-01: 100 ug via INTRAVENOUS

## 2014-07-01 MED ORDER — POLYETHYLENE GLYCOL 3350 17 G PO PACK
17.0000 g | PACK | Freq: Every day | ORAL | Status: DC | PRN
Start: 2014-07-01 — End: 2014-07-02

## 2014-07-01 MED ORDER — MIDAZOLAM HCL 2 MG/2ML IJ SOLN
INTRAMUSCULAR | Status: AC
  Filled 2014-07-01: qty 2

## 2014-07-01 MED ORDER — OXYCODONE-ACETAMINOPHEN 5-325 MG PO TABS: 1.0000 | ORAL_TABLET | ORAL | Status: DC | PRN

## 2014-07-01 MED ORDER — LIDOCAINE HCL (CARDIAC) 20 MG/ML IV SOLN
INTRAVENOUS | Status: DC | PRN
  Administered 2014-07-01: 100 mg via INTRAVENOUS

## 2014-07-01 MED ORDER — ROCURONIUM BROMIDE 50 MG/5ML IV SOLN
INTRAVENOUS | Status: AC
  Filled 2014-07-01: qty 1

## 2014-07-01 MED ORDER — SURGIFOAM 100 EX MISC
CUTANEOUS | Status: DC | PRN
  Administered 2014-07-01: 15:00:00 via TOPICAL

## 2014-07-01 MED ORDER — BUPIVACAINE LIPOSOME 1.3 % IJ SUSP
INTRAMUSCULAR | Status: DC | PRN
  Administered 2014-07-01: 20 mL

## 2014-07-01 MED ORDER — MIDAZOLAM HCL 5 MG/5ML IJ SOLN
INTRAMUSCULAR | Status: DC | PRN
  Administered 2014-07-01: 2 mg via INTRAVENOUS

## 2014-07-01 MED ORDER — SODIUM CHLORIDE 0.9 % IV SOLN: 250.0000 mL | INTRAVENOUS | Status: DC

## 2014-07-01 MED ORDER — LACTATED RINGERS IV SOLN
INTRAVENOUS | Status: DC
  Administered 2014-07-01: 12:00:00 via INTRAVENOUS

## 2014-07-01 MED ORDER — PROPOFOL 10 MG/ML IV BOLUS
INTRAVENOUS | Status: DC | PRN
  Administered 2014-07-01: 200 mg via INTRAVENOUS

## 2014-07-01 MED ORDER — BUPIVACAINE HCL 0.5 % IJ SOLN
INTRAMUSCULAR | Status: DC | PRN
  Administered 2014-07-01: 20 mL

## 2014-07-01 MED ORDER — HYDROCODONE-ACETAMINOPHEN 5-325 MG PO TABS
1.0000 | ORAL_TABLET | ORAL | Status: DC | PRN
  Administered 2014-07-01: 1 via ORAL
  Administered 2014-07-02 (×2): 2 via ORAL
  Filled 2014-07-01: qty 1
  Filled 2014-07-01 (×2): qty 2

## 2014-07-01 MED ORDER — EPHEDRINE SULFATE 50 MG/ML IJ SOLN
INTRAMUSCULAR | Status: DC | PRN
  Administered 2014-07-01: 10 mg via INTRAVENOUS

## 2014-07-01 MED ORDER — BUPIVACAINE LIPOSOME 1.3 % IJ SUSP
20.0000 mL | INTRAMUSCULAR | Status: DC
  Filled 2014-07-01: qty 20

## 2014-07-01 MED ORDER — METHOCARBAMOL 500 MG PO TABS
500.0000 mg | ORAL_TABLET | Freq: Four times a day (QID) | ORAL | Status: DC | PRN
  Administered 2014-07-02 (×2): 500 mg via ORAL
  Filled 2014-07-01 (×2): qty 1

## 2014-07-01 MED ORDER — LACTATED RINGERS IV SOLN
INTRAVENOUS | Status: DC | PRN
  Administered 2014-07-01 (×2): via INTRAVENOUS

## 2014-07-01 MED ORDER — ONDANSETRON HCL 4 MG/2ML IJ SOLN: 4.0000 mg | Freq: Once | INTRAMUSCULAR | Status: DC | PRN

## 2014-07-01 MED ORDER — BUPIVACAINE-EPINEPHRINE (PF) 0.5% -1:200000 IJ SOLN
INTRAMUSCULAR | Status: AC
  Filled 2014-07-01: qty 30

## 2014-07-01 SURGICAL SUPPLY — 60 items
BLADE SURG ROTATE 9660 (MISCELLANEOUS) IMPLANT
BUR MATCHSTICK NEURO 3.0 LAGG (BURR) ×3 IMPLANT
BUR ROUND FLUTED 4 SOFT TCH (BURR) IMPLANT
BUR ROUND FLUTED 4MM SOFT TCH (BURR)
BUR SABER RD CUTTING 3.0 (BURR) IMPLANT
BUR SABER RD CUTTING 3.0MM (BURR)
COLLAR CERV LO CONTOUR FIRM DE (SOFTGOODS) ×3 IMPLANT
CORDS BIPOLAR (ELECTRODE) ×3 IMPLANT
COVER SURGICAL LIGHT HANDLE (MISCELLANEOUS) ×3 IMPLANT
DERMABOND ADHESIVE PROPEN (GAUZE/BANDAGES/DRESSINGS) ×2
DERMABOND ADVANCED (GAUZE/BANDAGES/DRESSINGS) ×2
DERMABOND ADVANCED .7 DNX12 (GAUZE/BANDAGES/DRESSINGS) ×1 IMPLANT
DERMABOND ADVANCED .7 DNX6 (GAUZE/BANDAGES/DRESSINGS) ×1 IMPLANT
DRAPE C-ARM 42X72 X-RAY (DRAPES) ×6 IMPLANT
DRAPE INCISE IOBAN 66X45 STRL (DRAPES) IMPLANT
DRAPE LAPAROTOMY T 102X78X121 (DRAPES) ×3 IMPLANT
DRAPE MICROSCOPE LEICA (MISCELLANEOUS) IMPLANT
DRAPE PROXIMA HALF (DRAPES) ×6 IMPLANT
DRAPE SURG 17X23 STRL (DRAPES) ×6 IMPLANT
DRSG MEPILEX BORDER 4X4 (GAUZE/BANDAGES/DRESSINGS) ×3 IMPLANT
DRSG MEPILEX BORDER 4X8 (GAUZE/BANDAGES/DRESSINGS) ×3 IMPLANT
DURAPREP 26ML APPLICATOR (WOUND CARE) ×3 IMPLANT
ELECT COATED BLADE 2.86 ST (ELECTRODE) ×3 IMPLANT
ELECT REM PT RETURN 9FT ADLT (ELECTROSURGICAL) ×3
ELECTRODE REM PT RTRN 9FT ADLT (ELECTROSURGICAL) ×1 IMPLANT
EVACUATOR 1/8 PVC DRAIN (DRAIN) IMPLANT
GAUZE SPONGE 4X4 12PLY STRL (GAUZE/BANDAGES/DRESSINGS) ×3 IMPLANT
GLOVE BIOGEL M 6.5 STRL (GLOVE) ×3 IMPLANT
GLOVE BIOGEL PI IND STRL 6.5 (GLOVE) ×3 IMPLANT
GLOVE BIOGEL PI IND STRL 7.5 (GLOVE) ×2 IMPLANT
GLOVE BIOGEL PI INDICATOR 6.5 (GLOVE) ×6
GLOVE BIOGEL PI INDICATOR 7.5 (GLOVE) ×4
GLOVE ECLIPSE 6.5 STRL STRAW (GLOVE) ×6 IMPLANT
GLOVE ECLIPSE 7.0 STRL STRAW (GLOVE) ×6 IMPLANT
GLOVE ECLIPSE 9.0 STRL (GLOVE) ×3 IMPLANT
GLOVE SURG 8.5 LATEX PF (GLOVE) ×3 IMPLANT
GOWN STRL REUS W/ TWL LRG LVL3 (GOWN DISPOSABLE) ×2 IMPLANT
GOWN STRL REUS W/TWL 2XL LVL3 (GOWN DISPOSABLE) ×3 IMPLANT
GOWN STRL REUS W/TWL LRG LVL3 (GOWN DISPOSABLE) ×4
KIT BASIN OR (CUSTOM PROCEDURE TRAY) ×3 IMPLANT
KIT ROOM TURNOVER OR (KITS) ×3 IMPLANT
MANIFOLD NEPTUNE II (INSTRUMENTS) ×3 IMPLANT
NS IRRIG 1000ML POUR BTL (IV SOLUTION) ×3 IMPLANT
PACK ORTHO CERVICAL (CUSTOM PROCEDURE TRAY) ×3 IMPLANT
PAD ARMBOARD 7.5X6 YLW CONV (MISCELLANEOUS) ×6 IMPLANT
PATTIES SURGICAL .25X.25 (GAUZE/BANDAGES/DRESSINGS) ×3 IMPLANT
PATTIES SURGICAL .75X.75 (GAUZE/BANDAGES/DRESSINGS) ×3 IMPLANT
SPONGE SURGIFOAM ABS GEL 100 (HEMOSTASIS) IMPLANT
SUT ETHIBOND CT1 BRD #0 30IN (SUTURE) ×3 IMPLANT
SUT VIC AB 0 CT1 27 (SUTURE)
SUT VIC AB 0 CT1 27XBRD ANBCTR (SUTURE) IMPLANT
SUT VIC AB 2-0 CT1 27 (SUTURE)
SUT VIC AB 2-0 CT1 TAPERPNT 27 (SUTURE) IMPLANT
SUT VIC AB 2-0 UR6 27 (SUTURE) IMPLANT
SUT VICRYL 0 UR6 27IN ABS (SUTURE) ×3 IMPLANT
SUT VICRYL 4-0 PS2 18IN ABS (SUTURE) ×3 IMPLANT
TOWEL OR 17X24 6PK STRL BLUE (TOWEL DISPOSABLE) ×3 IMPLANT
TOWEL OR 17X26 10 PK STRL BLUE (TOWEL DISPOSABLE) ×3 IMPLANT
TRAY FOLEY CATH 16FRSI W/METER (SET/KITS/TRAYS/PACK) ×3 IMPLANT
WATER STERILE IRR 1000ML POUR (IV SOLUTION) ×3 IMPLANT

## 2014-07-01 NOTE — Op Note (Signed)
07/01/2014  4:26 PM  PATIENT:  Danielle [name]  58 y.o. female  MRN: [id number]  OPERATIVE REPORT  PRE-OPERATIVE DIAGNOSIS:  Right C5-6 and C6-7 spondylosis  POST-OPERATIVE DIAGNOSIS:  Right C5-6 and C6-7 spondylosis  PROCEDURE:  Procedure(s): RIGHT C5-6 and C6-7 FORAMINOTOMY    SURGEON:  Jessy Oto, MD     ASSISTANT:  Phillips Hay, PA-C  (Present throughout the entire procedure and necessary for completion of procedure in a timely manner)     ANESTHESIA:  General,    COMPLICATIONS:  None.      PROCEDURE:The patient was met in the holding area,and the appropriate cervical right C5-6 and C6-7 levels identified and marked with "x" and my initials. All questions were answered and informed consent signed.   The patient was then transported to OR she received pre operative antibiotics of clindomycin for penicillin/keflex allergy. The patient was then placed under general anesthesia without difficulty, foley catheter inserted sterilely and she was transferred to the operating room table prone position [place] horseshoe with chest rolls. All pressure points well-padded PAS stockings.. The patient received appropriate preoperative antibiotic prophylaxis.Time-out procedure was called and correct.   Sterile prep with DuraPrep and draped in the usual manner the shoulders were taped downwards and skin traction over the skin of the neck. Following DuraPrep draped in the usual manner. After timeout protocol, two spinal needles inserted At the C6-C5 level and identified with sterilely draped lateral C arm fluoro.  Incision was made approximately  C6-7 in the midline. This following infiltration of skin and subcutaneous layers with marcaine 1/2% 1:1 exparel 1.3% 10 cc. Incision carried through skin and subcutaneous layers using 10 blade scalpel and electrocautery down to the level ligamentum nuchae. Incision made centered on the spinous process of C6 and C5. Clamp then placed at the spinous process of  C5  C-arm fluoroscopy identified the clamps at the C5 level. Canning down one further spinous process then a single 0 Vicryl suture was used to mark the spinous process of C5. Electrocautery then used to carefully incise the cervical muscles off the right lateral aspect of the spinous process of C5, C6 and C7 . Carefully removing spinous muscles off of the inferior aspect lamina at C5 exposing the C5-6 posterior aspect of the interlaminar space. The magnification headlamp were used during this portion procedure. Boss McCollough retractor was inserted. High-speed bur was used to remove a small portion of bone from the inferior aspect of lamina of C5 and C6, and the medial 20% of the superior articular process of C6 and C7. Further thinning the superior aspect of the lamina of C6 and C7. A 1 mm Kerrison was then used to remove ligamentum flavum from superior aspect of the lamina C6 and the medial aspect of the inferior articular process of C5 approximately 20% exposing the superior articular process of C6.  A 1 mm Kerrison was used to remove bone off the superior aspect of the lamina of C6 and then resecting 20% of the medial aspect of the superior articular process of C6. Ligamentum flavum then easily lifted andand electrocautery used to cauterize epidural veins deep to  the ligamentum flavum and the 5 vascular leash overlying the C6 nerve root  was then resected. The operating room microscope was draped sterilely and brought into the field. Under the operating room microscope the epidural vein layer overlying the posterior aspect of the thecal sac and the C6 nerve root was then carefully lifted using a micro-titanium were cauterized  using bipolar electrocautery the 15 blade scalpel then used to incise this overlying the C6  nerve root releasing the vascular leash a forward angle 3-0 microcurette then used to remove a small portion of bone off the superior and medial aspect of the pedicle further mobilizing the C6  nerve root bipolar electrocautery to control all bleeding within the axillary area and C6  nerve. Bone wax was applied to bleeding cancellus bone surfaces are excellent hemostasis obtained for C7  nerve root was tracked superiorly and laterally nerve hook. The disc explored using a Penfield 4 found to be uncovertebral spur no disc material extruded  and the obvious posterior mass effect was felt to represent uncovertebral spur. Following this then hemostasis was obtained using thrombin-soaked Gelfoam and micro-pledgettes. When complete hemostasis was obtained all trial was removed I nerve hook could be easily passed out the neuroforamen about the lateral aspect of the C6  pedicle demonstrate the C6  neuroforamen completely decompressed. A 1 mm Kerrison was then used to remove ligamentum flavum from superior aspect of the lamina C7 and the medial aspect of the inferior articular process of C6 approximately 20% exposing the superior articular process of C7.  A 1 mm Kerrison was used to remove bone off the superior aspect of the lamina of C7 and then resecting 20% of the medial aspect of the superior articular process of C7. Ligamentum flavum then easily lifted andand electrocautery used to cauterize epidural veins deep to  the ligamentum flavum and the 5 vascular leash overlying the C7 nerve root  was then resected. The operating room microscope was draped sterilely and brought into the field. Under the operating room microscope the epidural vein layer overlying the posterior aspect of the thecal sac and the C7  nerve root was then carefully lifted using a micro-titanium were cauterized using bipolar electrocautery the 15 blade scalpel then used to incise this overlying the C7  nerve root releasing the vascular leash a forward angle 3-0 microcurette then used to remove a small portion of bone off the superior and medial aspect of the pedicle further mobilizing the C7 nerve root bipolar electrocautery to control all  bleeding within the axillary area and C7  nerve. Bone wax was applied to bleeding cancellus bone surfaces are excellent hemostasis obtained for C7  nerve root was tracked superiorly and laterally nerve hook. The obvious posterior mass effect was felt to represent uncovertebral spur. Following this then hemostasis was obtained using thrombin-soaked Gelfoam and micro-pledgettes. When complete hemostasis was obtained all trial was removed I nerve hook could be easily passed out the neuroforamen without the lateral aspect of the C7  pedicle demonstrate the C7  neuroforamen completely decompressed. Irrigation was carried out no active bleeding was present. Following further irrigation and the incision was closed by approximating the ligamentum nuchae with 0 Ethibond sutures. The subcutaneous layers approximated with interrupted 0 Vicryl suture more superficial layers with interrupted 2-0 Vicryl sutures and the skin closed with interrupted 4-0 Vicryl sutures. Dermabond was applied then MedPlex bandage. Soft cervical collar all instrument and sponge counts were correct. Patient was then returned to supine position on her stretcher. Returned to recovery room in satisfactory condition.  Physician assistant's responsibilities: Phillips Hay PA-C perform the duties of assistant physician and surgeon during this case present from the beginning of the case to the end of the case. She assisted with careful retraction of neural structures suctioning about her elements including cervical cord and C6 and C7 nerve roots. Performed  closure of the incision on the ligamentum nuchae to the skin and application of dressing. She assisted in positioning the patient had removal the patient from the OR table to her stretcher.        Danielle [name] E  07/01/2014, 4:26 PM

## 2014-07-01 NOTE — Transfer of Care (Signed)
Immediate Anesthesia Transfer of Care Note  Patient: Danielle [name]  Procedure(s) Performed: Procedure(s): RIGHT C5-6 and C6-7 FORAMINOTOMY (N/A)  Patient Location: PACU  Anesthesia Type:General  Level of Consciousness: awake, alert , oriented and patient cooperative  Airway & Oxygen Therapy: Patient Spontanous Breathing and Patient connected to nasal cannula oxygen  Post-op Assessment: Report given to PACU RN, Post -op Vital signs reviewed and stable and Patient moving all extremities  Post vital signs: Reviewed and stable  Complications: No apparent anesthesia complications

## 2014-07-01 NOTE — Anesthesia Postprocedure Evaluation (Signed)
  Anesthesia Post-op Note  Patient: Danielle [name]  Procedure(s) Performed: Procedure(s): RIGHT C5-6 and C6-7 FORAMINOTOMY (N/A)  Patient Location: PACU  Anesthesia Type:General  Level of Consciousness: awake  Airway and Oxygen Therapy: Patient Spontanous Breathing  Post-op Pain: mild  Post-op Assessment: Post-op Vital signs reviewed  Post-op Vital Signs: Reviewed  Last Vitals:  Filed Vitals:   07/01/14 1730  BP: 115/76  Pulse: 94  Temp:   Resp: 16    Complications: No apparent anesthesia complications

## 2014-07-01 NOTE — Anesthesia Preprocedure Evaluation (Signed)
Anesthesia Evaluation  Patient identified by MRN, date of birth, ID band Patient awake    Reviewed: Allergy & Precautions  History of Anesthesia Complications (+) PONV  Airway       Dental   Pulmonary Current Smoker,          Cardiovascular hypertension,     Neuro/Psych Depression TIA Neuromuscular disease CVA    GI/Hepatic GERD-  ,  Endo/Other  diabetes, Well ControlledHypothyroidism   Renal/GU Renal InsufficiencyRenal disease     Musculoskeletal   Abdominal   Peds  Hematology   Anesthesia Other Findings   Reproductive/Obstetrics                           Anesthesia Physical Anesthesia Plan  ASA: II  Anesthesia Plan: General   Post-op Pain Management:    Induction: Intravenous  Airway Management Planned: Oral ETT  Additional Equipment:   Intra-op Plan:   Post-operative Plan: Extubation in OR  Informed Consent: I have reviewed the patients History and Physical, chart, labs and discussed the procedure including the risks, benefits and alternatives for the proposed anesthesia with the patient or authorized representative who has indicated his/her understanding and acceptance.     Plan Discussed with: CRNA, Anesthesiologist and Surgeon  Anesthesia Plan Comments:         Anesthesia Quick Evaluation

## 2014-07-01 NOTE — Discharge Instructions (Signed)
No lifting greater than 10 lbs. °Avoid bending, stooping and twisting. °Walking in house for first week then may start to get out slowly increasing activity using arms. °Keep incision dry for 3 days, may use tegaderm or similar water impervious dressing. °Avoid overhead use of arms and overhead lifting. °Wear collar for comfort. °Use ice as needed for comfort. °

## 2014-07-01 NOTE — Brief Op Note (Signed)
07/01/2014  4:24 PM  PATIENT:  Danielle [name]  58 y.o. female  PRE-OPERATIVE DIAGNOSIS:  Right C5-6 and C6-7 spondylosis  POST-OPERATIVE DIAGNOSIS:  Right C5-6 and C6-7 spondylosis  PROCEDURE:  Procedure(s): RIGHT C5-6 and C6-7 FORAMINOTOMY (N/A)  SURGEON:  Surgeon(s) and Role:    * Jessy Oto, MD - Primary  PHYSICIAN ASSISTANT: Phillips Hay, PA-C  ANESTHESIA:   local, general and Dr. Sherren Kerns.  EBL:  Total I/O In: 1000 [I.V.:1000] Out: 250 [Urine:100; Blood:150]  BLOOD ADMINISTERED:none  DRAINS: Urinary Catheter (Foley)   LOCAL MEDICATIONS USED:  MARCAINE1/2% 1:1 EXPAREL 1.3%   , Amount: 15cc ml   SPECIMEN:  No Specimen  DISPOSITION OF SPECIMEN:  N/A  COUNTS:  YES  TOURNIQUET:  * No tourniquets in log *  DICTATION: .Dragon Dictation  PLAN OF CARE: Admit for overnight observation  PATIENT DISPOSITION:  PACU - hemodynamically stable.   Delay start of Pharmacological VTE agent (>24hrs) due to surgical blood loss or risk of bleeding: yes

## 2014-07-01 NOTE — Interval H&P Note (Signed)
History and Physical Interval Note:  07/01/2014 1:15 PM  Danielle [name]  has presented today for surgery, with the diagnosis of Right C5-6 and C6-7 spondylosis  The various methods of treatment have been discussed with the patient and family. After consideration of risks, benefits and other options for treatment, the patient has consented to  Procedure(s): RIGHT C5-6 and C6-7 FORAMINOTOMY (N/A) as a surgical intervention .  The patient's history has been reviewed, patient examined, no change in status, stable for surgery.  I have reviewed the patient's chart and labs.  Questions were answered to the patient's satisfaction.     NITKA,JAMES E

## 2014-07-02 ENCOUNTER — Encounter (HOSPITAL_COMMUNITY): Payer: Self-pay | Admitting: Specialist

## 2014-07-02 DIAGNOSIS — M47812 Spondylosis without myelopathy or radiculopathy, cervical region: Secondary | ICD-10-CM | POA: Diagnosis not present

## 2014-07-02 MED ORDER — KETOROLAC TROMETHAMINE 30 MG/ML IJ SOLN
30.0000 mg | Freq: Three times a day (TID) | INTRAMUSCULAR | Status: DC
  Administered 2014-07-02: 30 mg via INTRAVENOUS
  Filled 2014-07-02: qty 1

## 2014-07-02 NOTE — Progress Notes (Signed)
PT Cancellation Note  Patient Details Name: Danielle [name] MRN: [id number] DOB: 07-Nov-1956   Cancelled Treatment:    Reason Eval/Treat Not Completed: PT screened, no needs identified, will sign off. Pt screened by OT. Pt ambulating at mod I level at this time. No acute PT needs warranted. Thanks    Elie Confer [place], [place] 07/02/2014, 9:26 AM

## 2014-07-02 NOTE — Evaluation (Addendum)
Occupational Therapy Evaluation Patient Details Name: Danielle [name] MRN: [id number] DOB: 1956-08-31 Today's Date: 07/02/2014    History of Present Illness s/p posterior cervical fusion/foraminotomy level 2   Clinical Impression   Pt admitted with the above diagnoses and presents with below problem list. Pt will benefit from continued acute OT to address the below listed deficits and maximize independence with basic ADLs prior to d/c home. PTA pt was independent with ADLs. Pt currently at setup to min guard level for ADLs. Pt walked up and down 3 steps at min guard level. Per pt, pt's daughter lives nearby and able to provide intermittent supervision/assistance.      Follow Up Recommendations  Supervision - Intermittent;No OT follow up    Equipment Recommendations  3 in 1 bedside comode    Recommendations for Other Services       Precautions / Restrictions Precautions Required Braces or Orthoses: Cervical Brace Cervical Brace: Soft collar Restrictions Weight Bearing Restrictions: No      Mobility Bed Mobility Overal bed mobility: Modified Independent             General bed mobility comments: good technique. rolled to side then sat EOB. HOB elevated.  Transfers Overall transfer level: Needs assistance   Transfers: Sit to/from Stand Sit to Stand: Supervision         General transfer comment: good technique    Balance Overall balance assessment: Needs assistance Sitting-balance support: No upper extremity supported;Feet supported Sitting balance-Leahy Scale: Good     Standing balance support: No upper extremity supported;During functional activity Standing balance-Leahy Scale: Good                              ADL Overall ADL's : Needs assistance/impaired Eating/Feeding: Set up;Sitting   Grooming: Set up;Standing   Upper Body Bathing: Set up;Sitting   Lower Body Bathing: Sit to/from stand;Supervison/ safety;With adaptive equipment   Upper  Body Dressing : Set up;Sitting   Lower Body Dressing: Sit to/from stand;Min guard;With adaptive equipment   Toilet Transfer: Supervision/safety;Ambulation;Comfort height toilet   Toileting- Clothing Manipulation and Hygiene: Supervision/safety;Sit to/from stand   Tub/ Shower Transfer: Supervision/safety;Ambulation;3 in 1   Functional mobility during ADLs: Min guard General ADL Comments: Educated pt on techniques for safe completion of ADLs. Pt has reacher; educated on use with LB dressing. Discussed shower setup and having someone with her when she showers and having a seat in shower available.     Vision                     Perception     Praxis      Pertinent Vitals/Pain Pain Assessment: 0-10 Pain Score: 8  Pain Location: surgical site Pain Descriptors / Indicators: Aching;Constant Pain Intervention(s): Limited activity within patient's tolerance;Monitored during session;Repositioned;Ice applied;Other (comment);Premedicated before session     Hand Dominance     Extremity/Trunk Assessment Upper Extremity Assessment Upper Extremity Assessment: Overall WFL for tasks assessed   Lower Extremity Assessment Lower Extremity Assessment: Overall WFL for tasks assessed       Communication Communication Communication: No difficulties   Cognition Arousal/Alertness: Awake/alert Behavior During Therapy: WFL for tasks assessed/performed Overall Cognitive Status: Within Functional Limits for tasks assessed                     General Comments       Exercises       Shoulder Instructions  Home Living Family/patient expects to be discharged to:: Private residence Living Arrangements: Alone Available Help at Discharge: Family;Other (Comment) (daughter lives nearby) Type of Home: House Home Access: Stairs to enter Technical brewer of Steps: 3 Entrance Stairs-Rails: Can reach both       Bathroom Shower/Tub: Teacher, early years/pre:  Handicapped height     Home Equipment: Financial controller: Reacher        Prior Functioning/Environment Level of Independence: Independent             OT Diagnosis: Acute pain   OT Problem List: Impaired balance (sitting and/or standing);Decreased knowledge of use of DME or AE;Decreased knowledge of precautions;Pain   OT Treatment/Interventions: Self-care/ADL training;Therapeutic exercise;DME and/or AE instruction;Therapeutic activities;Patient/family education;Balance training    OT Goals(Current goals can be found in the care plan section) Acute Rehab OT Goals OT Goal Formulation: With patient Time For Goal Achievement: 07/09/14 Potential to Achieve Goals: Good ADL Goals Pt Will Perform Lower Body Bathing: with modified independence;with adaptive equipment;sit to/from stand Pt Will Perform Lower Body Dressing: with modified independence;with adaptive equipment;sit to/from stand Pt Will Transfer to Toilet: with modified independence;ambulating;grab bars (comfort height toilet) Pt Will Perform Toileting - Clothing Manipulation and hygiene: with modified independence;sit to/from stand Pt Will Perform Tub/Shower Transfer: with modified independence;ambulating;3 in 1  OT Frequency: Min 2X/week   Barriers to D/C:            Co-evaluation              End of Session Equipment Utilized During Treatment: Cervical collar  Activity Tolerance: Patient tolerated treatment well;Patient limited by fatigue Patient left: in chair;with call bell/phone within reach   Time: 0842-0908 OT Time Calculation (min): 26 min Charges:  OT General Charges $OT Visit: 1 Procedure OT Evaluation $Initial OT Evaluation Tier I: 1 Procedure OT Treatments $Self Care/Home Management : 8-22 mins G-Codes: OT G-codes **NOT FOR INPATIENT CLASS** Functional Assessment Tool Used: clinical judgement Functional Limitation: Self care Self Care Current Status (E0100): At least 1  percent but less than 20 percent impaired, limited or restricted Self Care Goal Status (F1219): At least 1 percent but less than 20 percent impaired, limited or restricted Self Care Discharge Status [id number]): At least 1 percent but less than 20 percent impaired, limited or restricted  Hortencia Pilar 07/02/2014, 10:09 AM

## 2014-07-02 NOTE — Progress Notes (Signed)
Patient's family concerned with patient leaving post-op day one. All questions answered and OT note/PA note from today read to family. Family expressed hessitation and stated "I will just take my mom to [facility] if I have any problems." Patient was given robaxin 500mg  to help with muscular headache and 4mg  of IV zofran for nausea. Fresh ice pack was applied to neck.   Patient discharged to home accompanied by daughter. Discharge instructions and rx given and explained and patient/family stated understanding. IV was removed and patient left unit in a stable condition with all personal belongings via wheelchair.

## 2014-07-02 NOTE — Progress Notes (Signed)
Orthopedic Tech Progress Note Patient Details:  Danielle [name] 1956/03/11 [id number]  Ortho Devices Type of Ortho Device: Soft collar Ortho Device/Splint Interventions: Application   Cammer, Theodoro Parma 07/02/2014, 7:41 AM

## 2014-07-02 NOTE — Progress Notes (Signed)
Subjective: 1 Day Post-Op Procedure(s) (LRB): RIGHT C5-6 and C6-7 FORAMINOTOMY (N/A) Patient reports pain as moderate. Minimal right arm pain.  Numbness of right arm resolved. Complains of positional headache.  No discomfort when flat, but pain in back of head and scalp with turning head No nausea or vomiting.  No photophobia.   Has not been out of bed yet.    Objective: Vital signs in last 24 hours: Temp:  [97.6 F (36.4 C)-98.3 F (36.8 C)] 97.6 F (36.4 C) (08/26 0600) Pulse Rate:  [76-95] 76 (08/26 0600) Resp:  [10-20] 16 (08/26 0600) BP: (100-142)/(61-83) 100/61 mmHg (08/26 0600) SpO2:  [92 %-100 %] 98 % (08/26 0600) Weight:  [88.451 kg (195 lb)] 88.451 kg (195 lb) (08/26 0500)  Intake/Output from previous day: 08/25 0701 - 08/26 0700 In: 1250 [I.V.:1250] Out: 1375 [Urine:1225; Blood:150] Intake/Output this shift:     Recent Labs  06/30/14 1547  HGB 14.2    Recent Labs  06/30/14 1547  WBC 11.5*  RBC 4.77  HCT 42.9  PLT 308    Recent Labs  06/30/14 1547  NA 140  K 4.3  CL 103  CO2 26  BUN 16  CREATININE 0.58  GLUCOSE 138*  CALCIUM 10.8*   No results found for this basename: LABPT, INR,  in the last 72 hours  Neurovascular intact Incision: no drainage Tender at base of skull.  Pt moving well. Strength in right arm intact.  Assessment/Plan: 1 Day Post-Op Procedure(s) (LRB): RIGHT C5-6 and C6-7 FORAMINOTOMY (N/A) Up with therapy Will use ice pack to back of neck.  Collar removed and feels better.  Headache most likely muscular.  Use muscle relaxant and ice packs today. Toradol IV If improved thru the day will discharge this afternoon.  Danielle [name] M 07/02/2014, 8:34 AM

## 2014-07-02 NOTE — Care Management Note (Signed)
CARE MANAGEMENT NOTE 07/02/2014  Patient:  [facility]   Account Number:  [id number]  Date Initiated:  07/02/2014  Documentation initiated by:  Ricki Miller  Subjective/Objective Assessment:   57 yr old female s/p C5-C6 foraminotomy     Action/Plan:   Patient has  no home health needs identified. Has family support at discharge.   Anticipated DC Date:  07/02/2014   Anticipated DC Plan:  [place]  CM consult      PAC Choice  DURABLE MEDICAL EQUIPMENT   Choice offered to / List presented to:     DME arranged  3-N-1      DME agency  [place] arranged  NA      Status of service:  Completed, signed off Medicare Important Message given?  NA - LOS <3 / Initial given by admissions (If response is "NO", the following Medicare IM given date fields will be blank) Date Medicare IM given:   Medicare IM given by:   Date Additional Medicare IM given:   Additional Medicare IM given by:    Discharge Disposition:  HOME/SELF CARE  Per UR Regulation:  Reviewed for med. necessity/level of care/duration of stay  If discussed at [place] of Stay Meetings, dates discussed:    Comments:

## 2014-07-02 NOTE — Progress Notes (Signed)
Patient examined and lab reviewed with Vernon PA-C. 

## 2014-07-07 NOTE — Discharge Summary (Signed)
Physician Discharge Summary  Patient ID: Danielle [name] MRN: [id number] DOB/AGE: 01/25/1956 58 y.o.  Admit date: 07/01/2014 Discharge date: 07/02/2014  Admission Diagnoses:  Spinal stenosis in cervical region secondary to spondylosis at C5-6 and C6-7  Discharge Diagnoses:  Principal Problem:   Spinal stenosis in cervical region   Past Medical History  Diagnosis Date  . Hypertension   . Glucose intolerance (pre-diabetes)   . History of tobacco abuse   . History of hiatal hernia   . Tachycardia   . 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 Obesity   . Dyslipidemia   . Cervical spondylosis without myelopathy 09/12/2013  . Complication of anesthesia   . PONV (postoperative nausea and vomiting)   . Family history of anesthesia complication     Mother - N/V  . Oral thrush     after back surgeries  . Heart murmur     nothing to be concerned  . Stroke     TIAs- 2013last one  . Hypothyroidism   . Kidney stone   . Gastroesophageal reflux disease     8./25/15- no problem  . History of blood transfusion   . Diverticulitis     Surgeries: Procedure(s): RIGHT C5-6 and C6-7 FORAMINOTOMY on 07/01/2014   Consultants (if any):  none  Discharged Condition: Improved  Hospital Course: [name] Danielle is an 58 y.o. female who was admitted 07/01/2014 with a diagnosis of Spinal stenosis in cervical region and went to the operating room on 07/01/2014 and underwent the above named procedures.    She was given perioperative antibiotics:  Anti-infectives   Start     Dose/Rate Route Frequency Ordered Stop   07/01/14 2015  ceFAZolin (ANCEF) IVPB 1 g/50 mL premix     1 g 100 mL/hr over 30 Minutes Intravenous Every 8 hours 07/01/14 2003 07/02/14 0623   07/01/14 1415  clindamycin (CLEOCIN) IVPB 900 mg  Status:  Discontinued     900 mg 100 mL/hr over 30 Minutes Intravenous To Surgery 07/01/14 1410 07/01/14 1925    .  She was given sequential compression devices, early ambulation for DVT  prophylaxis.  She benefited maximally from the hospital stay and there were no complications.    Recent vital signs:  Filed Vitals:   07/02/14 0600  BP: 100/61  Pulse: 76  Temp: 97.6 F (36.4 C)  Resp: 16    Recent laboratory studies:  Lab Results  Component Value Date   HGB 14.2 06/30/2014   HGB 14.1 12/27/2013   HGB 14.2 07/15/2013   Lab Results  Component Value Date   WBC 11.5* 06/30/2014   PLT 308 06/30/2014   Lab Results  Component Value Date   INR 0.94 03/28/2011   Lab Results  Component Value Date   NA 140 06/30/2014   K 4.3 06/30/2014   CL 103 06/30/2014   CO2 26 06/30/2014   BUN 16 06/30/2014   CREATININE 0.58 06/30/2014   GLUCOSE 138* 06/30/2014    Discharge Medications:     Medication List         cholecalciferol 1000 UNITS tablet  Commonly known as:  VITAMIN D  Take 1,000 Units by mouth daily.     HYDROcodone-acetaminophen 7.5-325 MG per tablet  Commonly known as:  NORCO  Take 7.5-325 tablets by mouth every 6 (six) hours as needed (pain).     ibuprofen 800 MG tablet  Commonly known as:  ADVIL,MOTRIN  Take 800 mg by mouth as needed for headache (  pain).     levothyroxine 200 MCG tablet  Commonly known as:  SYNTHROID  Take 1 tablet (200 mcg total) by mouth daily before breakfast.     methocarbamol 500 MG tablet  Commonly known as:  ROBAXIN  Take 1 tablet (500 mg total) by mouth every 6 (six) hours as needed for muscle spasms (spasm).     oxyCODONE-acetaminophen 5-325 MG per tablet  Commonly known as:  ROXICET  Take 1-2 tablets by mouth every 4 (four) hours as needed for severe pain.        Diagnostic Studies: Dg Spine Portable 1 View  07/01/2014   CLINICAL DATA:  Right C5-6 and C6-7 spondylosis.  EXAM: PORTABLE SPINE - 1 VIEW  COMPARISON:  None.  FINDINGS: Single lateral intraoperative view demonstrates a surgical instrument with tip over the posterior soft tissues at the level of the C5 spinous process. Vertebral body alignment and heights are  normal.  IMPRESSION: Surgical instrument with tip over the posterior soft tissues at the level of the C5 spinous process. Recommend correlation with findings at the time of the procedure.   Electronically Signed   By: Marin Olp M.D.   On: 07/01/2014 15:10    Disposition: 01-Home or Self Care      Discharge Instructions   Call MD / Call 911    Complete by:  As directed   If you experience chest pain or shortness of breath, CALL 911 and be transported to the hospital emergency room.  If you develope a fever above 101 F, pus (white drainage) or increased drainage or redness at the wound, or calf pain, call your surgeon's office.     Constipation Prevention    Complete by:  As directed   Drink plenty of fluids.  Prune juice may be helpful.  You may use a stool softener, such as Colace (over the counter) 100 mg twice a day.  Use MiraLax (over the counter) for constipation as needed.     Diet - low sodium heart healthy    Complete by:  As directed      Discharge instructions    Complete by:  As directed   Wear soft collar as needed for pain.  Ice packs to wound for comfort.  Change dressing daily or as needed. Avoid overhead use of arms.  May shower in 3 days if no wound drainage.  Keep incision covered     Increase activity slowly as tolerated    Complete by:  As directed            Follow-up Information   Follow up with NITKA,JAMES E, MD In 2 weeks.   Specialty:  Orthopedic Surgery   Contact information:   [place] [address] [phone]        Signed: Epimenio Foot 07/07/2014, 3:42 PM

## 2014-07-09 NOTE — Discharge Summary (Signed)
Patient d/c summary note and lab reviewed.  

## 2014-09-05 ENCOUNTER — Ambulatory Visit (HOSPITAL_COMMUNITY)
Admission: RE | Admit: 2014-09-05 | Discharge: 2014-09-05 | Disposition: A | Discharge status: Home / Self Care | Payer: Medicare Other | Source: Ambulatory Visit | Attending: Family Medicine | Admitting: Family Medicine

## 2014-09-05 ENCOUNTER — Ambulatory Visit (INDEPENDENT_AMBULATORY_CARE_PROVIDER_SITE_OTHER): Payer: Medicare Other | Admitting: Family Medicine

## 2014-09-05 ENCOUNTER — Encounter: Payer: Self-pay | Admitting: Family Medicine

## 2014-09-05 VITALS — BP 162/98 | HR 70 | Temp 98.7°F | Ht 65.0 in | Wt 191.0 lb

## 2014-09-05 DIAGNOSIS — K047 Periapical abscess without sinus: Secondary | ICD-10-CM

## 2014-09-05 DIAGNOSIS — I10 Essential (primary) hypertension: Secondary | ICD-10-CM

## 2014-09-05 DIAGNOSIS — E039 Hypothyroidism, unspecified: Secondary | ICD-10-CM

## 2014-09-05 DIAGNOSIS — R002 Palpitations: Secondary | ICD-10-CM

## 2014-09-05 LAB — TSH: TSH: 5.715 u[IU]/mL — ABNORMAL HIGH (ref 0.350–4.500)

## 2014-09-05 MED ORDER — PENICILLIN V POTASSIUM 500 MG PO TABS: 500.0000 mg | ORAL_TABLET | Freq: Four times a day (QID) | ORAL | Status: DC

## 2014-09-05 MED ORDER — METOPROLOL SUCCINATE ER 100 MG PO TB24: 100.0000 mg | ORAL_TABLET | Freq: Every day | ORAL | Status: DC

## 2014-09-05 MED ORDER — ASPIRIN 81 MG PO TABS: 81.0000 mg | ORAL_TABLET | Freq: Every day | ORAL | Status: DC

## 2014-09-05 MED ORDER — HYDROCODONE-ACETAMINOPHEN 7.5-325 MG PO TABS: 7.5000 | ORAL_TABLET | Freq: Four times a day (QID) | ORAL | Status: DC | PRN

## 2014-09-05 NOTE — Assessment & Plan Note (Signed)
Antibiotics, pain meds, see dentist.

## 2014-09-05 NOTE — Assessment & Plan Note (Addendum)
Symptoms have returned.  EKG is sinus tachy.  Will hold off holter for now.   Will restart ASA 81

## 2014-09-05 NOTE — Progress Notes (Signed)
   Subjective:    Patient ID: Danielle [name], female    DOB: Jul 31, 1956, 58 y.o.   MRN: [id number]  HPI Two distinct problems: 1. Made appointment because she has had another spell of palpitations. Increased BP.  Had been palpitation free for a year and BP had been well controled.  She had a similar pairing of palpitations and hypertension about a year ago.  No firm diagnosis ever achieved. Has been off all BP meds and control has been good. 2. Tooth pain and facial swelling x 48 hours.  Known cavity in right maxillary tooth.  Plans to see dentist. Incidentally, recent c-spine surg Had flu shot in hosp with surg.  Needs tetanus    Review of Systems     Objective:   Physical Exam Obvious right facial swelling.   Visible cavity on cuspid right maxillary at gum line.   Neck, no nodes. Lungs clear Cardiac Regular but borderline tachycardia. EKG Sinus with rate=95       Assessment & Plan:

## 2014-09-05 NOTE — Assessment & Plan Note (Signed)
Restart metoprolol.  FU 2 weeks.

## 2014-09-05 NOTE — Patient Instructions (Signed)
I would like to recheck you in two weeks.   I sent a prescription for the blood pressure medicine (also slows heart rate) to [facility]. Start taking an 81 mg aspirin every day. I will give you written prescriptions for the penicillin and pain med. You will need a tetanus booster next visit. I may need to do another Holter monitor - we will see.

## 2014-09-05 NOTE — Assessment & Plan Note (Signed)
Will recheck TSH.  She is on a high dose of synthroid - and TSHs have been fine.

## 2014-09-18 ENCOUNTER — Other Ambulatory Visit: Payer: Self-pay

## 2014-09-18 DIAGNOSIS — Z1231 Encounter for screening mammogram for malignant neoplasm of breast: Secondary | ICD-10-CM

## 2014-09-19 ENCOUNTER — Ambulatory Visit (INDEPENDENT_AMBULATORY_CARE_PROVIDER_SITE_OTHER): Payer: Medicare Other | Admitting: Family Medicine

## 2014-09-19 ENCOUNTER — Encounter: Payer: Self-pay | Admitting: Family Medicine

## 2014-09-19 VITALS — BP 136/85 | HR 85 | Temp 98.1°F | Ht 65.0 in | Wt 192.2 lb

## 2014-09-19 DIAGNOSIS — K047 Periapical abscess without sinus: Secondary | ICD-10-CM

## 2014-09-19 DIAGNOSIS — I10 Essential (primary) hypertension: Secondary | ICD-10-CM

## 2014-09-19 DIAGNOSIS — R002 Palpitations: Secondary | ICD-10-CM

## 2014-09-19 DIAGNOSIS — Z23 Encounter for immunization: Secondary | ICD-10-CM

## 2014-09-19 DIAGNOSIS — S40811A Abrasion of right upper arm, initial encounter: Secondary | ICD-10-CM | POA: Insufficient documentation

## 2014-09-19 MED ORDER — PENICILLIN V POTASSIUM 500 MG PO TABS: 500.0000 mg | ORAL_TABLET | Freq: Four times a day (QID) | ORAL | Status: DC

## 2014-09-19 NOTE — Patient Instructions (Signed)
I am delighted that your blood pressure is normal and your heart rate is behaving.  Let me know if you have more spells or rapid heart beat. I would go ahead and pick up the refill of penicillin to start taking right away if your tooth acts up before the 10/08/14 extraction. See me in 6 months if things are going well.

## 2014-09-19 NOTE — Assessment & Plan Note (Signed)
Will reorder PCN in case infection recurs before extraction.

## 2014-09-19 NOTE — Addendum Note (Signed)
Addended by: Enid Skeens K on: 09/19/2014 10:20 AM   Modules accepted: Orders

## 2014-09-19 NOTE — Assessment & Plan Note (Signed)
Not currently infected.  Will give tetanus.

## 2014-09-19 NOTE — Assessment & Plan Note (Signed)
Now well controled

## 2014-09-19 NOTE — Assessment & Plan Note (Signed)
Improved with no spells on metoprolol

## 2014-09-19 NOTE — Progress Notes (Signed)
   Subjective:    Patient ID: Danielle [name], female    DOB: January 06, 1956, 58 y.o.   MRN: [id number]  HPI Feels great on metoprolol.  Home BPs good and no episodes of tachycardia. Mildly red abrasion on Rt arm.  Needs tetanus booster.    Review of Systems     Objective:   Physical ExamMildly red abrasion Rt arm BP normal Cardiac RRR without m or g        Assessment & Plan:

## 2014-10-09 ENCOUNTER — Ambulatory Visit: Payer: Medicare Other

## 2014-10-24 ENCOUNTER — Ambulatory Visit
Admission: RE | Admit: 2014-10-24 | Discharge: 2014-10-24 | Disposition: A | Discharge status: Home / Self Care | Payer: Medicare Other | Source: Ambulatory Visit

## 2014-10-24 DIAGNOSIS — Z1231 Encounter for screening mammogram for malignant neoplasm of breast: Secondary | ICD-10-CM

## 2014-11-26 ENCOUNTER — Encounter: Payer: Self-pay | Admitting: Family Medicine

## 2014-11-26 ENCOUNTER — Ambulatory Visit (INDEPENDENT_AMBULATORY_CARE_PROVIDER_SITE_OTHER): Payer: BLUE CROSS/BLUE SHIELD | Admitting: Family Medicine

## 2014-11-26 VITALS — BP 131/90 | HR 99 | Temp 98.1°F | Ht 65.0 in | Wt 193.1 lb

## 2014-11-26 DIAGNOSIS — M47812 Spondylosis without myelopathy or radiculopathy, cervical region: Secondary | ICD-10-CM

## 2014-11-26 DIAGNOSIS — E119 Type 2 diabetes mellitus without complications: Secondary | ICD-10-CM

## 2014-11-26 DIAGNOSIS — B370 Candidal stomatitis: Secondary | ICD-10-CM

## 2014-11-26 DIAGNOSIS — E1165 Type 2 diabetes mellitus with hyperglycemia: Secondary | ICD-10-CM

## 2014-11-26 DIAGNOSIS — IMO0001 Reserved for inherently not codable concepts without codable children: Secondary | ICD-10-CM

## 2014-11-26 LAB — COMPREHENSIVE METABOLIC PANEL
ALT: 22 U/L (ref 0–35)
AST: 15 U/L (ref 0–37)
Albumin: 4.3 g/dL (ref 3.5–5.2)
Alkaline Phosphatase: 112 U/L (ref 39–117)
BILIRUBIN TOTAL: 0.3 mg/dL (ref 0.2–1.2)
BUN: 16 mg/dL (ref 6–23)
CO2: 29 mEq/L (ref 19–32)
Calcium: 11.7 mg/dL — ABNORMAL HIGH (ref 8.4–10.5)
Chloride: 98 mEq/L (ref 96–112)
Creat: 0.6 mg/dL (ref 0.50–1.10)
Glucose, Bld: 124 mg/dL — ABNORMAL HIGH (ref 70–99)
Potassium: 4.7 mEq/L (ref 3.5–5.3)
SODIUM: 135 meq/L (ref 135–145)
Total Protein: 7.5 g/dL (ref 6.0–8.3)

## 2014-11-26 LAB — POCT GLYCOSYLATED HEMOGLOBIN (HGB A1C): HEMOGLOBIN A1C: 6.5

## 2014-11-26 MED ORDER — NYSTATIN 100000 UNIT/ML MT SUSP: 5.0000 mL | Freq: Four times a day (QID) | OROMUCOSAL | Status: DC

## 2014-11-26 NOTE — Assessment & Plan Note (Addendum)
Check A1C - good level at goal.  No change in therapy.  Minimize future steroid injections.

## 2014-11-26 NOTE — Assessment & Plan Note (Addendum)
Could be related to her abd pain Given bump in calcium, will once again check PTH.

## 2014-11-26 NOTE — Patient Instructions (Signed)
I am not sure about all the troubles.  Let's see how you do off the steroid shots. For now, get some over the counter pepcid and take every day to see if that helps the stomach problem. Try to cut back on the ibuprofen and use tylenol instead.  Tylenol tends to be much easier on your stomach. I want you to call me in two weeks to see what symptoms have gone away by stopping the steroids. I will call with the blood test results.   I doubt that all your symptoms will go away.  Why don't you make an appointment now to see me in three weeks.

## 2014-11-27 ENCOUNTER — Encounter: Payer: Self-pay | Admitting: Family Medicine

## 2014-11-27 NOTE — Assessment & Plan Note (Signed)
Per ortho.  Minimize steroids, especially considering little benefit and hyperglycemia.

## 2014-11-27 NOTE — Progress Notes (Signed)
   Subjective:    Patient ID: Danielle [name], female    DOB: November 20, 1955, 59 y.o.   MRN: [id number]  HPI Several issues: Had recent C spine surg.  Prior to surg was having Right arm radiculopathy.  That has now cleared - AND she has begun having problems with her left arm.  Ortho has done several epidural steroid injections.  No relief.  She is to have another this afternoon - maybe. She has had significant increase in her BS while on steroids.  A1C is still good.  We discussed risk benefit of steroid injectiosn.  She has abd complaints with some epigastric discomfort, cramping pain and occasional diarrhea.  These seem also to coincide with steroid injections.  No blood.  No recent antibiotics.  Diarrhea is intermitant.    Review of Systems     Objective:   Physical ExamLung clear Cardiac RRR without m or g Abd, epigastric tenderness.  No organomegally or masses.        Assessment & Plan:

## 2014-12-02 ENCOUNTER — Other Ambulatory Visit: Payer: BLUE CROSS/BLUE SHIELD

## 2014-12-02 NOTE — Progress Notes (Signed)
PTH DONE TODAY Danielle [name]

## 2014-12-03 LAB — PTH, INTACT AND CALCIUM
Calcium: 11.3 mg/dL — ABNORMAL HIGH (ref 8.4–10.5)
PTH: 41 pg/mL (ref 14–64)

## 2014-12-03 NOTE — Progress Notes (Signed)
Patient ID: Danielle [name], female   DOB: Nov 05, 1956, 59 y.o.   MRN: [id number] Called and discussed results.  She did not get a steroid injection.  BS is much better.  Still having diarrhea and palpitations.  She will make an appointment with me next week.  We will decide about repeat parathyroid scan at that time.

## 2014-12-10 ENCOUNTER — Ambulatory Visit (HOSPITAL_COMMUNITY)
Admission: RE | Admit: 2014-12-10 | Discharge: 2014-12-10 | Disposition: A | Discharge status: Home / Self Care | Payer: BLUE CROSS/BLUE SHIELD | Source: Ambulatory Visit | Attending: Family Medicine | Admitting: Family Medicine

## 2014-12-10 ENCOUNTER — Ambulatory Visit (INDEPENDENT_AMBULATORY_CARE_PROVIDER_SITE_OTHER): Payer: BLUE CROSS/BLUE SHIELD | Admitting: Family Medicine

## 2014-12-10 ENCOUNTER — Encounter: Payer: Self-pay | Admitting: Family Medicine

## 2014-12-10 VITALS — BP 135/88 | HR 103 | Temp 98.6°F | Ht 65.0 in | Wt 193.0 lb

## 2014-12-10 DIAGNOSIS — E039 Hypothyroidism, unspecified: Secondary | ICD-10-CM | POA: Insufficient documentation

## 2014-12-10 DIAGNOSIS — I10 Essential (primary) hypertension: Secondary | ICD-10-CM | POA: Insufficient documentation

## 2014-12-10 DIAGNOSIS — I4510 Unspecified right bundle-branch block: Secondary | ICD-10-CM | POA: Insufficient documentation

## 2014-12-10 DIAGNOSIS — R002 Palpitations: Secondary | ICD-10-CM

## 2014-12-10 DIAGNOSIS — R1013 Epigastric pain: Secondary | ICD-10-CM | POA: Insufficient documentation

## 2014-12-10 DIAGNOSIS — R000 Tachycardia, unspecified: Secondary | ICD-10-CM | POA: Diagnosis not present

## 2014-12-10 DIAGNOSIS — Z23 Encounter for immunization: Secondary | ICD-10-CM | POA: Diagnosis not present

## 2014-12-10 LAB — CBC
HCT: 40 % (ref 36.0–46.0)
Hemoglobin: 13.2 g/dL (ref 12.0–15.0)
MCH: 29.1 pg (ref 26.0–34.0)
MCHC: 33 g/dL (ref 30.0–36.0)
MCV: 88.1 fL (ref 78.0–100.0)
MPV: 10.5 fL (ref 8.6–12.4)
PLATELETS: 318 10*3/uL (ref 150–400)
RBC: 4.54 MIL/uL (ref 3.87–5.11)
RDW: 13.7 % (ref 11.5–15.5)
WBC: 12.8 10*3/uL — ABNORMAL HIGH (ref 4.0–10.5)

## 2014-12-10 MED ORDER — OMEPRAZOLE 40 MG PO CPDR: 40.0000 mg | DELAYED_RELEASE_CAPSULE | Freq: Every day | ORAL | Status: DC

## 2014-12-10 MED ORDER — LOSARTAN POTASSIUM 100 MG PO TABS: 100.0000 mg | ORAL_TABLET | Freq: Every day | ORAL | Status: DC

## 2014-12-10 NOTE — Assessment & Plan Note (Addendum)
Long differential.  If does not clear will need GI referral and/or CT abd.  Check CBC and lipase.  Empiric omeprazole as trial.  FU based on blood work.  Lipase normal.  CBC shows mild elevation of WBC.  Still with pain - empiric omeprazole did not relieve.  Will check CT of abd.

## 2014-12-10 NOTE — Patient Instructions (Signed)
I added a new medicine, losartan, for your blood pressure and to protect your kidneys. Another new medicine, omeprazole, is to see if it helps with your abdominal pain I will call with blood work results next Monday.  If the pain is still there and I don't have answers, you will need more tests. See an eye doctor and tell them you have diabetes.  Have them send me a copy of their exam FU will depend on what the tests show and if the pain goes away.  We will talk on the phone.  You get a pneumonia vaccine today.

## 2014-12-10 NOTE — Assessment & Plan Note (Signed)
Check TSH to make sure not overmedicated

## 2014-12-10 NOTE — Progress Notes (Signed)
   Subjective:    Patient ID: Danielle [name], female    DOB: [birth year], 59 y.o.   MRN: [id number]  HPI  Still with epigastric discomfort.  BS improved.  Minimal diarrhea.  No vomiting.  No heartburn.  CMP was OK except hypercalcemia.    Review of Systems     Objective:   Physical ExamLungs clear Cardiac RRR without m or g Abd no mass.  Mild epigastric tenderness.        Assessment & Plan:

## 2014-12-10 NOTE — Assessment & Plan Note (Addendum)
Worsening symptoms EKG OK.  Check TSH to make sure not overmedicated.

## 2014-12-10 NOTE — Assessment & Plan Note (Signed)
Could be causing abd pain, but will place on back burner and explore other causes.

## 2014-12-11 LAB — TSH: TSH: 1.202 u[IU]/mL (ref 0.350–4.500)

## 2014-12-11 LAB — LIPASE: Lipase: <10 U/L (ref 0–75)

## 2014-12-15 NOTE — Addendum Note (Signed)
Addended by: [name] on: 12/15/2014 05:40 PM   Modules accepted: Orders, Level of Service

## 2014-12-26 ENCOUNTER — Other Ambulatory Visit: Payer: Self-pay | Admitting: Family Medicine

## 2014-12-26 DIAGNOSIS — E039 Hypothyroidism, unspecified: Secondary | ICD-10-CM

## 2014-12-26 MED ORDER — LEVOTHYROXINE SODIUM 200 MCG PO TABS: 200.0000 ug | ORAL_TABLET | Freq: Every day | ORAL | Status: DC

## 2014-12-26 NOTE — Telephone Encounter (Signed)
Needs refill on levothyrine (?)  [facility] [address]

## 2015-01-05 ENCOUNTER — Other Ambulatory Visit: Payer: Self-pay | Admitting: Specialist

## 2015-01-05 DIAGNOSIS — M542 Cervicalgia: Secondary | ICD-10-CM

## 2015-01-09 ENCOUNTER — Telehealth: Payer: Self-pay | Admitting: *Deleted

## 2015-01-09 DIAGNOSIS — R1013 Epigastric pain: Secondary | ICD-10-CM

## 2015-01-09 NOTE — Telephone Encounter (Signed)
Pt is requesting the status of her CT of the abdomen. Fleeger, Salome Spotted

## 2015-01-11 ENCOUNTER — Ambulatory Visit
Admission: RE | Admit: 2015-01-11 | Discharge: 2015-01-11 | Disposition: A | Discharge status: Home / Self Care | Payer: Medicare Other | Source: Ambulatory Visit | Attending: Specialist | Admitting: Specialist

## 2015-01-11 DIAGNOSIS — M542 Cervicalgia: Secondary | ICD-10-CM

## 2015-01-21 ENCOUNTER — Ambulatory Visit: Payer: Medicare Other | Admitting: Podiatrist

## 2015-01-22 NOTE — Telephone Encounter (Signed)
Reviewed my last note.  Once I saw that WBC was elevated, I had intended to order an abd CT. Talked with patient, she is still having a lot of abd pain, not better.  Will proceed with CT as planned.  Apologized for the delay.

## 2015-02-05 ENCOUNTER — Telehealth: Payer: Self-pay | Admitting: *Deleted

## 2015-02-05 DIAGNOSIS — R1013 Epigastric pain: Secondary | ICD-10-CM

## 2015-02-05 NOTE — Telephone Encounter (Signed)
Spoke with patient and informed her that she will need to come in to get blood work done before CT preformed. She will be coming in on Monday 02/06/2015 @ 10:00am for lab appointment. patient has also been informed of CT appointment. Monday 02/16/2015 @ 11:40am at [facility] [address] location. Patient has been informed that she will need to pick up contrast before appointemnt

## 2015-02-05 NOTE — Telephone Encounter (Signed)
done

## 2015-02-05 NOTE — Telephone Encounter (Signed)
Dr. Andria Frames will you please order a BUN and Creat on patient. She needs it for her CT

## 2015-02-09 ENCOUNTER — Other Ambulatory Visit: Payer: Medicare Other

## 2015-02-09 DIAGNOSIS — R1013 Epigastric pain: Secondary | ICD-10-CM

## 2015-02-09 LAB — BASIC METABOLIC PANEL
BUN: 11 mg/dL (ref 6–23)
CO2: 27 mEq/L (ref 19–32)
Calcium: 10.8 mg/dL — ABNORMAL HIGH (ref 8.4–10.5)
Chloride: 105 mEq/L (ref 96–112)
Creat: 0.45 mg/dL — ABNORMAL LOW (ref 0.50–1.10)
GLUCOSE: 106 mg/dL — AB (ref 70–99)
POTASSIUM: 4.7 meq/L (ref 3.5–5.3)
SODIUM: 141 meq/L (ref 135–145)

## 2015-02-09 NOTE — Progress Notes (Signed)
[facility] phlebotomist drew:  BMP 

## 2015-02-10 ENCOUNTER — Telehealth: Payer: Self-pay | Admitting: *Deleted

## 2015-02-10 NOTE — Telephone Encounter (Signed)
Spoke with [employer] to get patient's CT abdomen pelvis with contrast ([id number]) authorized and more clinical data is needed and would like for you to call for a peer to peer physician review. Patient's member ID # [id number] number to call back is [phone] please follow prompts to speak with another physician.  Procedure is being preformed 4/11 at [facility] [address]

## 2015-02-10 NOTE — Telephone Encounter (Signed)
Called as requested.  Was told scan is approved with an authorization # [id number] valid for 30 days.

## 2015-02-16 ENCOUNTER — Ambulatory Visit
Admission: RE | Admit: 2015-02-16 | Discharge: 2015-02-16 | Disposition: A | Discharge status: Home / Self Care | Payer: Medicare Other | Source: Ambulatory Visit | Attending: Family Medicine | Admitting: Family Medicine

## 2015-02-16 DIAGNOSIS — R1013 Epigastric pain: Secondary | ICD-10-CM

## 2015-02-16 MED ORDER — IOPAMIDOL (ISOVUE-300) INJECTION 61%
100.0000 mL | Freq: Once | INTRAVENOUS | Status: AC | PRN
  Administered 2015-02-16: 100 mL via INTRAVENOUS

## 2015-02-18 ENCOUNTER — Telehealth: Payer: Self-pay | Admitting: Family Medicine

## 2015-02-18 NOTE — Telephone Encounter (Signed)
Called and LM that CT was (mostly) normal and that it did not explain her abd pain.  Asked to make an appointment.  At that time I will go over the incidental findings on CT in detail.

## 2015-02-20 ENCOUNTER — Encounter: Payer: Self-pay | Admitting: Family Medicine

## 2015-02-20 ENCOUNTER — Ambulatory Visit (INDEPENDENT_AMBULATORY_CARE_PROVIDER_SITE_OTHER): Payer: Medicare Other | Admitting: Family Medicine

## 2015-02-20 VITALS — BP 136/89 | HR 105 | Temp 98.8°F | Wt 195.0 lb

## 2015-02-20 DIAGNOSIS — R1032 Left lower quadrant pain: Secondary | ICD-10-CM | POA: Diagnosis not present

## 2015-02-20 DIAGNOSIS — R300 Dysuria: Secondary | ICD-10-CM | POA: Diagnosis not present

## 2015-02-20 DIAGNOSIS — R1030 Lower abdominal pain, unspecified: Secondary | ICD-10-CM | POA: Insufficient documentation

## 2015-02-20 LAB — POCT URINALYSIS DIPSTICK
Bilirubin, UA: NEGATIVE
Glucose, UA: NEGATIVE
Ketones, UA: NEGATIVE
Nitrite, UA: NEGATIVE
PH UA: 6
PROTEIN UA: NEGATIVE
Spec Grav, UA: 1.02
UROBILINOGEN UA: 0.2

## 2015-02-20 LAB — POCT UA - MICROSCOPIC ONLY

## 2015-02-20 MED ORDER — METRONIDAZOLE 500 MG PO TABS: 500.0000 mg | ORAL_TABLET | Freq: Two times a day (BID) | ORAL | Status: DC

## 2015-02-20 MED ORDER — CIPROFLOXACIN HCL 500 MG PO TABS: 500.0000 mg | ORAL_TABLET | Freq: Two times a day (BID) | ORAL | Status: DC

## 2015-02-20 NOTE — Progress Notes (Signed)
   Subjective:    Patient ID: Danielle [name], female    DOB: 17-Oct-1956, 59 y.o.   MRN: [id number]  Patient presents for a same day appointment.  HPI  ABDOMINAL PAIN: - Recent history with epigastric abdominal pain for about 2-3 months, mostly constant with worsening on eating with sharp pains, associated with some regular diarrhea, also with intermittent "dark red stools" with history of hemorrhoids over past few months. She has recently followed with PCP for this complaint, last seen February 2016 for epigastric abd pain, initial labs with mild elevated WBC, negative lipase, trial of omeprazole without relief, sent for Abdominal CT without identified etiology of abd pain (results - no acute etiology, b/l adrenal lesions consistent w/ adenomas, Left nephrolithiasis within kidney not in ureter). - Additional Hx: s/p multiple abdominal surgeries - appendectomy, cholecystectomy, hysterectomy b/l oophrectomy - Today woke up with lower suprapubic abdominal sharp pains radiating up, not worse with anything including movements, has not had anything to eat or drink today, also with Left sided constant sharp abdominal pain without radiation. Also since waking up started with urinary frequency and pressure on bladder, but denies any dysuria, hematuria, or odor. - History of diverticulitis about 1 year ago with similar symptoms, treated outpatient with antibiotics. Also history of prior kidney stones. - Takes Hydrocodone for chronic back pain, but does not improve abdominal pain - Admits nausea (without vomiting), diarrhea yesterday (last episode, non-bloody) - Denies any fevers/chills, bloody or dark stools since 3 weeks ago, abdominal bloating or distention, leg swelling, rash, CP, SOB  I have reviewed and updated the following as appropriate: allergies and current medications  Social Hx: - Active smoker - No significant recent alcohol use  Review of Systems  See above HPI    Objective:   Physical  Exam  BP 136/89 mmHg  Pulse 105  Temp(Src) 98.8 F (37.1 C) (Oral)  Wt 195 lb (88.451 kg)  Gen - well-appearing, mild-mod discomfort due to abdominal pain, but overall cooperative and NAD HEENT - MMM Heart - Mild tachycardia with regular rhythm, no murmurs heard Abd - soft, +moderate epigastric tenderness, +mild general lower abdominal tenderness with inc focal LLQ tenderness, no distention, no rebound tenderness, no guarding, no masses, +active BS Ext - peripheral pulses intact +2 b/l Skin - warm, dry, no rashes Neuro - awake, alert, oriented     Assessment & Plan:   See specific A&P problem list for details.

## 2015-02-20 NOTE — Assessment & Plan Note (Addendum)
Acute focal LLQ abdominal pain worsening within 24 hours in setting of recent chronic (2-3 months) epigastric abd pain and prior history of some bloody stools with diarrhea. Known history of diverticulitis with similar symptoms in 2014. Additionally, recent Abd CT (for epigastric pain - without identified etiology). Suspected acute episode of diverticulitis, however unclear etiology of more chronic epigastric pain (still possibly GERD / PUD related) likely warrants further work-up GI referral for possible EGD. Additionally with microscopic hematuria and identified L-kidney stone on CT (not in ureter 1 week ago) may have moved into ureter causing pain, however not colicky or characteristic (less likely). Discussed patient with Dr. Erin Hearing. - Currently afebrile, mildly tachycardic, uncomfortable but benign abdomen, tolerating PO, not clinically dehydrated - UA - neg nitrites, trace leuks, mod hgb with RBC 5-10, calcium oxalate crystals  Plan: 1. Empiric trial PO antibiotics for suspected diverticulitis - Cipro 500mg  PO BID x 10 days and Metronidazole 500mg  PO BID x 10 days 2. Continue home meds for pain control, Tylenol, caution with NSAIDs with epigastric pain but if improved may try. Declined additional pain rx 3. Improve hydration, continue PO as tolerated 4. Ordered urine culture, however UTI covered with Cipro - f/u results 5. RTC 3-4 days to follow-up make sure improving on antibiotics. If worsening or any concerns, strict return criteria given, may need more urgent eval in ED

## 2015-02-20 NOTE — Patient Instructions (Signed)
Dear Danielle [name], Thank you for coming in to clinic today.  1. There are a few possibilities for your abdominal pain. I am most concerned about Diverticulitis - we will treat with antibiotics Cipro and Flagyl - take each one twice daily for 10 days (finish entire course). Also you may take Tylenol or Ibuprofen as needed for fevers and pain. 2. Also this could be a Kidney stone (we saw one in your Left Kidney on your CT scan - it may have moved) however this seems less likely. 3. Possible Urinary Tract Infection - we will send your urine for a culture - CIpro should cover this as well (if it turns out positive and we need to switch antibiotics we will let you know)  Over weekend - if symptoms worsen with fevers, worsening abdominal pain (not improving with medicine), return of blood or dark stools, unable to eat or drink, nausea or vomiting or other concerning symptoms please go directly to ED for further evaluation.  Please schedule a follow-up appointment with Dr. Andria Frames or available provider early next week Monday or Tuesday for close follow-up to make sure you are improving - if not may need to go to ED for repeat imaging and evaluation  If you have any other questions or concerns, please feel free to call the clinic to contact me. You may also schedule an earlier appointment if necessary.  However, if your symptoms get significantly worse, please go to the Emergency Department to seek immediate medical attention.  Nobie Putnam, [place]

## 2015-02-23 ENCOUNTER — Ambulatory Visit: Payer: Medicare Other | Admitting: Family Medicine

## 2015-02-23 LAB — URINE CULTURE

## 2015-03-06 ENCOUNTER — Other Ambulatory Visit: Payer: Self-pay | Admitting: Orthopedic Surgery

## 2015-03-15 ENCOUNTER — Emergency Department (HOSPITAL_COMMUNITY)
Admission: EM | Admit: 2015-03-15 | Discharge: 2015-03-15 | Disposition: A | Discharge status: Home / Self Care | Payer: Medicare Other | Source: Home / Self Care | Attending: Family Medicine | Admitting: Family Medicine

## 2015-03-15 ENCOUNTER — Encounter (HOSPITAL_COMMUNITY): Payer: Self-pay | Admitting: Emergency Medicine

## 2015-03-15 DIAGNOSIS — W57XXXA Bitten or stung by nonvenomous insect and other nonvenomous arthropods, initial encounter: Secondary | ICD-10-CM

## 2015-03-15 DIAGNOSIS — S50862A Insect bite (nonvenomous) of left forearm, initial encounter: Secondary | ICD-10-CM | POA: Diagnosis not present

## 2015-03-15 NOTE — ED Provider Notes (Signed)
CSN: [id number]     Arrival date & time 03/15/15  1650 History   First MD Initiated Contact with Patient 03/15/15 1741     Chief Complaint  Patient presents with  . Insect Bite   (Consider location/radiation/quality/duration/timing/severity/associated sxs/prior Treatment) HPI Comments: 59 year old female was working out in the yard about 45-60 minutes ago and she felt some sort of bite to the left distal forearm. She has since developed a 2 cm x 2 cm area of erythema with minor underlying induration and surrounding pruritus. She feels  a numbness surrounding the area and it goes to the elbow. there is no systemic rash. No systemic swelling no intraoral swelling, cough, problems breathing or urticaria.   Past Medical History  Diagnosis Date  . Hypertension   . Glucose intolerance (pre-diabetes)   . History of tobacco abuse   . History of hiatal hernia   . Tachycardia   . 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 Obesity   . Dyslipidemia   . Cervical spondylosis without myelopathy 09/12/2013  . Complication of anesthesia   . PONV (postoperative nausea and vomiting)   . Family history of anesthesia complication     Mother - N/V  . Oral thrush     after back surgeries  . Heart murmur     nothing to be concerned  . Stroke     TIAs- 2013last one  . Hypothyroidism   . Kidney stone   . Gastroesophageal reflux disease     8./25/15- no problem  . History of blood transfusion   . Diverticulitis    Past Surgical History  Procedure Laterality Date  . Abdominal hysterectomy    . Cholecystectomy    . Tonsillectomy    . Appendectomy    . Cystoscopy w/ stone manipulation    . Cystoscopy w/ ureteral stent placement    . Back surgery      10 spine surgeries since 1988.  Marland Kitchen Posterior cervical fusion/foraminotomy N/A 07/01/2014    Procedure: RIGHT C5-6 and C6-7 FORAMINOTOMY;  Surgeon: Jessy Oto, MD;  Location: [place];  Service: Orthopedics;  Laterality: N/A;   Family History  Problem  Relation Age of Onset  . Heart attack Father   . Cancer Father   . Diabetes Father   . Heart attack Brother   . Cancer Other   . Coronary artery disease Other   . Stroke Other   . Diabetes Other   . Hypertension Other   . Heart failure Other   . Stroke Mother   . Diabetes Mother   . Cancer Mother    History  Substance Use Topics  . Smoking status: Current Every Day Smoker -- 0.50 packs/day for 43 years    Types: Cigarettes  . Smokeless tobacco: Never Used  . Alcohol Use: No   OB History    No data available     Review of Systems  Constitutional: Negative for fever, activity change and fatigue.  HENT: Negative.   Respiratory: Negative for cough, shortness of breath and wheezing.   Cardiovascular: Negative for chest pain, palpitations and leg swelling.  Gastrointestinal: Negative.   Musculoskeletal: Negative.   Skin: Positive for color change. Negative for rash.  Neurological: Positive for numbness. Negative for dizziness, tremors, seizures, syncope, weakness and headaches.  Psychiatric/Behavioral: Negative.   All other systems reviewed and are negative.   Allergies  Cephalexin; Codeine; and Ivp dye  Home Medications   Prior to Admission medications   Medication  Sig Start Date End Date Taking? Authorizing Provider  levothyroxine (SYNTHROID) 200 MCG tablet Take 1 tablet (200 mcg total) by mouth daily before breakfast. 12/26/14  Yes [name], MD  aspirin 81 MG tablet Take 1 tablet (81 mg total) by mouth daily. 09/05/14   [name], MD  cholecalciferol (VITAMIN D) 1000 UNITS tablet Take 1,000 Units by mouth daily.    Historical Provider, MD  ciprofloxacin (CIPRO) 500 MG tablet Take 1 tablet (500 mg total) by mouth 2 (two) times daily. 02/20/15   Olin Hauser, DO  HYDROcodone-acetaminophen (NORCO/VICODIN) 5-325 MG per tablet Take 1 tablet by mouth every 6 (six) hours as needed for moderate pain. Per ortho, Dr. Louanne Skye    Historical Provider, MD    ibuprofen (ADVIL,MOTRIN) 800 MG tablet Take 800 mg by mouth as needed for headache (pain).  02/14/13   Historical Provider, MD  losartan (COZAAR) 100 MG tablet Take 1 tablet (100 mg total) by mouth daily. 12/10/14   [name], MD  metoprolol succinate (TOPROL-XL) 100 MG 24 hr tablet Take 1 tablet (100 mg total) by mouth daily. Take with or immediately following a meal. 09/05/14   [name], MD  metroNIDAZOLE (FLAGYL) 500 MG tablet Take 1 tablet (500 mg total) by mouth 2 (two) times daily. Do not drink alcohol while taking this medicine. 02/20/15   Olin Hauser, DO  nystatin (MYCOSTATIN) 100000 UNIT/ML suspension Take 5 mLs (500,000 Units total) by mouth 4 (four) times daily. 11/26/14   [name], MD  omeprazole (PRILOSEC) 40 MG capsule Take 1 capsule (40 mg total) by mouth daily. 12/10/14   [name], MD   BP 137/88 mmHg  Pulse 122  Temp(Src) 98.7 F (37.1 C) (Oral)  Resp 18  SpO2 98% Physical Exam  Constitutional: She is oriented to person, place, and time. She appears well-developed and well-nourished. No distress.  HENT:  Mouth/Throat: Oropharynx is clear and moist. No oropharyngeal exudate.  No intraoral swelling. Soft palate rises symmetrically. Uvula midline without edema. No swelling of the lips.  Eyes: Conjunctivae and EOM are normal.  Neck: Normal range of motion. Neck supple.  Cardiovascular: Regular rhythm and normal heart sounds.   Pulmonary/Chest: Effort normal and breath sounds normal. No respiratory distress. She has no wheezes. She has no rales.  Neurological: She is alert and oriented to person, place, and time.  Skin: Skin is warm and dry.  There is an area of erythema approximately 27 years by 2 cm located to the distal forearm extensor surface. The erythema is without papules or vesicles. There is mild surrounding swelling. No lymphangitis or surrounding rash or other lesions. No drainage, bleeding. No bite or pain marks seen under  magnification. Patient has full function of the left upper extremity. Motor is 5 over 5. Distal vascular is intact. Radial pulse 2+.  Psychiatric: She has a normal mood and affect.  Nursing note and vitals reviewed.   ED Course  Procedures (including critical care time) Labs Review Labs Reviewed - No data to display  Imaging Review No results found.   MDM   1. Insect bite of left forearm with local reaction, initial encounter    Apply cold compresses or ice as discussed. May take either Zyrtec, Claritin or Allegra. If the itching, redness or swelling is worse take Benadryl 25-50 mg every 4 hours. Apply hydrocortisone 1% cream to the area 3-4 times a day as needed. May also use Benadryl cream or gel. Discussed  red flags/warnings of systemic allergic reaction and seek medical attn promptly.     Janne Napoleon, NP 03/15/15 1759

## 2015-03-15 NOTE — Discharge Instructions (Signed)
Insect Bite Apply cold compresses or ice as discussed. May take either Zyrtec, Claritin or Allegra. If the itching, redness or swelling is worse take Benadryl 25-50 mg every 4 hours. Apply hydrocortisone 1% cream to the area 3-4 times a day as needed. May also use Benadryl cream or gel. Mosquitoes, flies, fleas, bedbugs, and many other insects can bite. Insect bites are different from insect stings. A sting is when venom is injected into the skin. Some insect bites can transmit infectious diseases. SYMPTOMS  Insect bites usually turn red, swell, and itch for 2 to 4 days. They often go away on their own. TREATMENT  Your caregiver may prescribe antibiotic medicines if a bacterial infection develops in the bite. HOME CARE INSTRUCTIONS  Do not scratch the bite area.  Keep the bite area clean and dry. Wash the bite area thoroughly with soap and water.  Put ice or cool compresses on the bite area.  Put ice in a plastic bag.  Place a towel between your skin and the bag.  Leave the ice on for 20 minutes, 4 times a day for the first 2 to 3 days, or as directed.  You may apply a baking soda paste, cortisone cream, or calamine lotion to the bite area as directed by your caregiver. This can help reduce itching and swelling.  Only take over-the-counter or prescription medicines as directed by your caregiver.  If you are given antibiotics, take them as directed. Finish them even if you start to feel better. You may need a tetanus shot if:  You cannot remember when you had your last tetanus shot.  You have never had a tetanus shot.  The injury broke your skin. If you get a tetanus shot, your arm may swell, get red, and feel warm to the touch. This is common and not a problem. If you need a tetanus shot and you choose not to have one, there is a rare chance of getting tetanus. Sickness from tetanus can be serious. SEEK IMMEDIATE MEDICAL CARE IF:   You have increased pain, redness, or swelling in  the bite area.  You see a red line on the skin coming from the bite.  You have a fever.  You have joint pain.  You have a headache or neck pain.  You have unusual weakness.  You have a rash.  You have chest pain or shortness of breath.  You have abdominal pain, nausea, or vomiting.  You feel unusually tired or sleepy. MAKE SURE YOU:   Understand these instructions.  Will watch your condition.  Will get help right away if you are not doing well or get worse. Document Released: 12/01/2004 Document Revised: 01/16/2012 Document Reviewed: 05/25/2011 [facility] Patient Information 2015 [place], [place]. This information is not intended to replace advice given to you by your health care provider. Make sure you discuss any questions you have with your health care provider.

## 2015-03-15 NOTE — ED Notes (Signed)
Reports possible insect bite to left forearm about thirty to forty minutes ago.  States "I was out working in the yard".

## 2015-04-02 ENCOUNTER — Encounter (HOSPITAL_BASED_OUTPATIENT_CLINIC_OR_DEPARTMENT_OTHER): Payer: Self-pay | Admitting: *Deleted

## 2015-04-02 NOTE — Progress Notes (Signed)
Anesthesia ok with labs from 02/11/15. Do not need repeat labs. Pt informed.

## 2015-04-02 NOTE — Progress Notes (Signed)
Pt instructed to check with Dr. Levell July office about discontinuation of ASA 81mg  and motrin for surgery.

## 2015-04-07 ENCOUNTER — Encounter (HOSPITAL_BASED_OUTPATIENT_CLINIC_OR_DEPARTMENT_OTHER)
Admission: RE | Disposition: A | Discharge status: Home / Self Care | Payer: Self-pay | Source: Ambulatory Visit | Attending: Orthopedic Surgery

## 2015-04-07 ENCOUNTER — Ambulatory Visit (HOSPITAL_BASED_OUTPATIENT_CLINIC_OR_DEPARTMENT_OTHER)
Admission: RE | Admit: 2015-04-07 | Discharge: 2015-04-07 | Disposition: A | Discharge status: Home / Self Care | Payer: Medicare Other | Source: Ambulatory Visit | Attending: Orthopedic Surgery | Admitting: Orthopedic Surgery

## 2015-04-07 ENCOUNTER — Encounter (HOSPITAL_BASED_OUTPATIENT_CLINIC_OR_DEPARTMENT_OTHER): Payer: Self-pay | Admitting: Orthopedic Surgery

## 2015-04-07 ENCOUNTER — Ambulatory Visit (HOSPITAL_BASED_OUTPATIENT_CLINIC_OR_DEPARTMENT_OTHER): Payer: Medicare Other | Admitting: Anesthesiology

## 2015-04-07 DIAGNOSIS — Z6832 Body mass index (BMI) 32.0-32.9, adult: Secondary | ICD-10-CM | POA: Diagnosis not present

## 2015-04-07 DIAGNOSIS — Z79891 Long term (current) use of opiate analgesic: Secondary | ICD-10-CM | POA: Insufficient documentation

## 2015-04-07 DIAGNOSIS — I10 Essential (primary) hypertension: Secondary | ICD-10-CM | POA: Diagnosis not present

## 2015-04-07 DIAGNOSIS — N289 Disorder of kidney and ureter, unspecified: Secondary | ICD-10-CM | POA: Diagnosis not present

## 2015-04-07 DIAGNOSIS — E039 Hypothyroidism, unspecified: Secondary | ICD-10-CM | POA: Diagnosis not present

## 2015-04-07 DIAGNOSIS — K219 Gastro-esophageal reflux disease without esophagitis: Secondary | ICD-10-CM | POA: Insufficient documentation

## 2015-04-07 DIAGNOSIS — Z8781 Personal history of (healed) traumatic fracture: Secondary | ICD-10-CM | POA: Diagnosis not present

## 2015-04-07 DIAGNOSIS — G5601 Carpal tunnel syndrome, right upper limb: Secondary | ICD-10-CM | POA: Diagnosis not present

## 2015-04-07 DIAGNOSIS — E119 Type 2 diabetes mellitus without complications: Secondary | ICD-10-CM | POA: Insufficient documentation

## 2015-04-07 DIAGNOSIS — Z79899 Other long term (current) drug therapy: Secondary | ICD-10-CM | POA: Insufficient documentation

## 2015-04-07 DIAGNOSIS — F17210 Nicotine dependence, cigarettes, uncomplicated: Secondary | ICD-10-CM | POA: Insufficient documentation

## 2015-04-07 DIAGNOSIS — Z981 Arthrodesis status: Secondary | ICD-10-CM | POA: Diagnosis not present

## 2015-04-07 DIAGNOSIS — G5621 Lesion of ulnar nerve, right upper limb: Secondary | ICD-10-CM | POA: Diagnosis not present

## 2015-04-07 DIAGNOSIS — E669 Obesity, unspecified: Secondary | ICD-10-CM | POA: Insufficient documentation

## 2015-04-07 DIAGNOSIS — G5602 Carpal tunnel syndrome, left upper limb: Secondary | ICD-10-CM | POA: Insufficient documentation

## 2015-04-07 DIAGNOSIS — R200 Anesthesia of skin: Secondary | ICD-10-CM | POA: Diagnosis present

## 2015-04-07 DIAGNOSIS — Z8673 Personal history of transient ischemic attack (TIA), and cerebral infarction without residual deficits: Secondary | ICD-10-CM | POA: Diagnosis not present

## 2015-04-07 DIAGNOSIS — Z791 Long term (current) use of non-steroidal anti-inflammatories (NSAID): Secondary | ICD-10-CM | POA: Diagnosis not present

## 2015-04-07 DIAGNOSIS — M1990 Unspecified osteoarthritis, unspecified site: Secondary | ICD-10-CM | POA: Diagnosis not present

## 2015-04-07 DIAGNOSIS — M25521 Pain in right elbow: Secondary | ICD-10-CM | POA: Diagnosis present

## 2015-04-07 HISTORY — DX: Type 2 diabetes mellitus without complications: E11.9

## 2015-04-07 HISTORY — PX: CARPAL TUNNEL RELEASE: SHX101

## 2015-04-07 HISTORY — PX: ULNAR NERVE TRANSPOSITION: SHX2595

## 2015-04-07 LAB — POCT HEMOGLOBIN-HEMACUE: HEMOGLOBIN: 14.3 g/dL (ref 12.0–15.0)

## 2015-04-07 SURGERY — CARPAL TUNNEL RELEASE
Anesthesia: General | Site: Wrist | Laterality: Right

## 2015-04-07 MED ORDER — CHLORHEXIDINE GLUCONATE 4 % EX LIQD: 60.0000 mL | Freq: Once | CUTANEOUS | Status: DC

## 2015-04-07 MED ORDER — VANCOMYCIN HCL IN DEXTROSE 1-5 GM/200ML-% IV SOLN
1000.0000 mg | INTRAVENOUS | Status: AC
  Administered 2015-04-07: 1000 mg via INTRAVENOUS

## 2015-04-07 MED ORDER — MIDAZOLAM HCL 2 MG/2ML IJ SOLN
INTRAMUSCULAR | Status: AC
  Filled 2015-04-07: qty 2

## 2015-04-07 MED ORDER — OXYCODONE HCL 5 MG/5ML PO SOLN: 5.0000 mg | Freq: Once | ORAL | Status: DC | PRN

## 2015-04-07 MED ORDER — LIDOCAINE HCL (CARDIAC) 20 MG/ML IV SOLN
INTRAVENOUS | Status: DC | PRN
  Administered 2015-04-07: 20 mg via INTRAVENOUS

## 2015-04-07 MED ORDER — FENTANYL CITRATE (PF) 100 MCG/2ML IJ SOLN
50.0000 ug | INTRAMUSCULAR | Status: DC | PRN
  Administered 2015-04-07: 50 ug via INTRAVENOUS

## 2015-04-07 MED ORDER — MEPERIDINE HCL 25 MG/ML IJ SOLN: 6.2500 mg | INTRAMUSCULAR | Status: DC | PRN

## 2015-04-07 MED ORDER — VANCOMYCIN HCL IN DEXTROSE 1-5 GM/200ML-% IV SOLN: 1000.0000 mg | INTRAVENOUS | Status: DC

## 2015-04-07 MED ORDER — ONDANSETRON HCL 4 MG/2ML IJ SOLN
INTRAMUSCULAR | Status: DC | PRN
Start: 2015-04-07 — End: 2015-04-07
  Administered 2015-04-07: 4 mg via INTRAVENOUS

## 2015-04-07 MED ORDER — BUPIVACAINE HCL (PF) 0.25 % IJ SOLN
INTRAMUSCULAR | Status: AC
  Filled 2015-04-07: qty 30

## 2015-04-07 MED ORDER — HYDROMORPHONE HCL 1 MG/ML IJ SOLN: 0.2500 mg | INTRAMUSCULAR | Status: DC | PRN

## 2015-04-07 MED ORDER — KETOROLAC TROMETHAMINE 30 MG/ML IJ SOLN: 30.0000 mg | Freq: Once | INTRAMUSCULAR | Status: DC | PRN

## 2015-04-07 MED ORDER — OXYCODONE-ACETAMINOPHEN 10-325 MG PO TABS: 1.0000 | ORAL_TABLET | ORAL | Status: DC | PRN

## 2015-04-07 MED ORDER — DEXAMETHASONE SODIUM PHOSPHATE 10 MG/ML IJ SOLN
INTRAMUSCULAR | Status: DC | PRN
  Administered 2015-04-07: 4 mg via INTRAVENOUS

## 2015-04-07 MED ORDER — FENTANYL CITRATE (PF) 100 MCG/2ML IJ SOLN
INTRAMUSCULAR | Status: AC
  Filled 2015-04-07: qty 6

## 2015-04-07 MED ORDER — FENTANYL CITRATE (PF) 100 MCG/2ML IJ SOLN
INTRAMUSCULAR | Status: AC
  Filled 2015-04-07: qty 2

## 2015-04-07 MED ORDER — PROMETHAZINE HCL 25 MG/ML IJ SOLN: 6.2500 mg | INTRAMUSCULAR | Status: DC | PRN

## 2015-04-07 MED ORDER — PROPOFOL 10 MG/ML IV BOLUS
INTRAVENOUS | Status: DC | PRN
  Administered 2015-04-07: 200 mg via INTRAVENOUS

## 2015-04-07 MED ORDER — VANCOMYCIN HCL IN DEXTROSE 1-5 GM/200ML-% IV SOLN
INTRAVENOUS | Status: AC
  Filled 2015-04-07: qty 200

## 2015-04-07 MED ORDER — MIDAZOLAM HCL 2 MG/2ML IJ SOLN
1.0000 mg | INTRAMUSCULAR | Status: DC | PRN
  Administered 2015-04-07: 2 mg via INTRAVENOUS

## 2015-04-07 MED ORDER — OXYCODONE HCL 5 MG PO TABS: 5.0000 mg | ORAL_TABLET | Freq: Once | ORAL | Status: DC | PRN

## 2015-04-07 MED ORDER — LACTATED RINGERS IV SOLN
INTRAVENOUS | Status: DC
  Administered 2015-04-07: 10:00:00 via INTRAVENOUS

## 2015-04-07 SURGICAL SUPPLY — 54 items
BLADE MINI RND TIP GREEN BEAV (BLADE) IMPLANT
BLADE SURG 15 STRL LF DISP TIS (BLADE) ×2 IMPLANT
BLADE SURG 15 STRL SS (BLADE) ×4
BNDG COHESIVE 3X5 TAN STRL LF (GAUZE/BANDAGES/DRESSINGS) ×9 IMPLANT
BNDG ESMARK 4X9 LF (GAUZE/BANDAGES/DRESSINGS) ×3 IMPLANT
BNDG GAUZE ELAST 4 BULKY (GAUZE/BANDAGES/DRESSINGS) ×6 IMPLANT
CHLORAPREP W/TINT 26ML (MISCELLANEOUS) ×3 IMPLANT
CORDS BIPOLAR (ELECTRODE) ×3 IMPLANT
COVER BACK TABLE 60X90IN (DRAPES) ×3 IMPLANT
COVER MAYO STAND STRL (DRAPES) ×3 IMPLANT
CUFF TOURN SGL LL 18 NRW (TOURNIQUET CUFF) IMPLANT
CUFF TOURNIQUET SINGLE 18IN (TOURNIQUET CUFF) IMPLANT
DECANTER SPIKE VIAL GLASS SM (MISCELLANEOUS) IMPLANT
DRAPE EXTREMITY T 121X128X90 (DRAPE) ×3 IMPLANT
DRAPE SURG 17X23 STRL (DRAPES) ×3 IMPLANT
DRSG PAD ABDOMINAL 8X10 ST (GAUZE/BANDAGES/DRESSINGS) ×6 IMPLANT
GAUZE SPONGE 4X4 12PLY STRL (GAUZE/BANDAGES/DRESSINGS) ×3 IMPLANT
GAUZE SPONGE 4X4 16PLY XRAY LF (GAUZE/BANDAGES/DRESSINGS) IMPLANT
GAUZE XEROFORM 1X8 LF (GAUZE/BANDAGES/DRESSINGS) ×3 IMPLANT
GLOVE BIO SURGEON STRL SZ 6.5 (GLOVE) ×2 IMPLANT
GLOVE BIO SURGEONS STRL SZ 6.5 (GLOVE) ×1
GLOVE BIOGEL PI IND STRL 7.0 (GLOVE) ×2 IMPLANT
GLOVE BIOGEL PI IND STRL 8.5 (GLOVE) ×1 IMPLANT
GLOVE BIOGEL PI INDICATOR 7.0 (GLOVE) ×4
GLOVE BIOGEL PI INDICATOR 8.5 (GLOVE) ×2
GLOVE EXAM NITRILE MD LF STRL (GLOVE) ×3 IMPLANT
GLOVE SURG ORTHO 8.0 STRL STRW (GLOVE) ×3 IMPLANT
GLOVE SURG SS PI 7.0 STRL IVOR (GLOVE) ×3 IMPLANT
GOWN STRL REUS W/ TWL LRG LVL3 (GOWN DISPOSABLE) ×2 IMPLANT
GOWN STRL REUS W/TWL LRG LVL3 (GOWN DISPOSABLE) ×4
GOWN STRL REUS W/TWL XL LVL3 (GOWN DISPOSABLE) ×3 IMPLANT
LOOP VESSEL MAXI BLUE (MISCELLANEOUS) IMPLANT
NEEDLE PRECISIONGLIDE 27X1.5 (NEEDLE) IMPLANT
NS IRRIG 1000ML POUR BTL (IV SOLUTION) ×3 IMPLANT
PACK BASIN DAY SURGERY FS (CUSTOM PROCEDURE TRAY) ×3 IMPLANT
PAD CAST 3X4 CTTN HI CHSV (CAST SUPPLIES) IMPLANT
PAD CAST 4YDX4 CTTN HI CHSV (CAST SUPPLIES) ×1 IMPLANT
PADDING CAST ABS 4INX4YD NS (CAST SUPPLIES) ×2
PADDING CAST ABS COTTON 4X4 ST (CAST SUPPLIES) ×1 IMPLANT
PADDING CAST COTTON 3X4 STRL (CAST SUPPLIES)
PADDING CAST COTTON 4X4 STRL (CAST SUPPLIES) ×2
SLEEVE SCD COMPRESS KNEE MED (MISCELLANEOUS) ×3 IMPLANT
SLING ARM LRG ADULT FOAM STRAP (SOFTGOODS) ×3 IMPLANT
SPLINT PLASTER CAST XFAST 3X15 (CAST SUPPLIES) IMPLANT
SPLINT PLASTER XTRA FASTSET 3X (CAST SUPPLIES)
STOCKINETTE 4X48 STRL (DRAPES) ×3 IMPLANT
SUT ETHILON 4 0 PS 2 18 (SUTURE) ×3 IMPLANT
SUT VIC AB 2-0 SH 27 (SUTURE) ×2
SUT VIC AB 2-0 SH 27XBRD (SUTURE) ×1 IMPLANT
SUT VICRYL 4-0 PS2 18IN ABS (SUTURE) ×3 IMPLANT
SYR BULB 3OZ (MISCELLANEOUS) ×3 IMPLANT
SYR CONTROL 10ML LL (SYRINGE) IMPLANT
TOWEL OR 17X24 6PK STRL BLUE (TOWEL DISPOSABLE) ×3 IMPLANT
UNDERPAD 30X30 (UNDERPADS AND DIAPERS) ×3 IMPLANT

## 2015-04-07 NOTE — Progress Notes (Signed)
Assisted Dr. Germeroth with right, ultrasound guided, supraclavicular block. Side rails up, monitors on throughout procedure. See vital signs in flow sheet. Tolerated Procedure well. 

## 2015-04-07 NOTE — Anesthesia Preprocedure Evaluation (Signed)
Anesthesia Evaluation  Patient identified by MRN, date of birth, ID band Patient awake    Reviewed: Allergy & Precautions  History of Anesthesia Complications (+) PONV and history of anesthetic complications  Airway Mallampati: II  TM Distance: >3 FB Neck ROM: Full    Dental  (+) Partial Lower, Partial Upper   Pulmonary Current Smoker,  breath sounds clear to auscultation  Pulmonary exam normal       Cardiovascular hypertension, Pt. on medications Normal cardiovascular examRhythm:Regular Rate:Normal     Neuro/Psych Depression TIA Neuromuscular disease CVA    GI/Hepatic hiatal hernia, GERD-  ,  Endo/Other  diabetes, Well ControlledHypothyroidism   Renal/GU Renal InsufficiencyRenal disease     Musculoskeletal  (+) Arthritis -,   Abdominal (+) + obese,   Peds  Hematology   Anesthesia Other Findings   Reproductive/Obstetrics                             Anesthesia Physical  Anesthesia Plan  ASA: III  Anesthesia Plan:    Post-op Pain Management:    Induction: Intravenous  Airway Management Planned:   Additional Equipment:   Intra-op Plan:   Post-operative Plan: Extubation in OR  Informed Consent: I have reviewed the patients History and Physical, chart, labs and discussed the procedure including the risks, benefits and alternatives for the proposed anesthesia with the patient or authorized representative who has indicated his/her understanding and acceptance.     Plan Discussed with: CRNA  Anesthesia Plan Comments:         Anesthesia Quick Evaluation

## 2015-04-07 NOTE — Op Note (Signed)
Dictation Number [id number]

## 2015-04-07 NOTE — Transfer of Care (Signed)
Immediate Anesthesia Transfer of Care Note  Patient: Danielle [name]  Procedure(s) Performed: Procedure(s): RIGHT CARPAL TUNNEL RELEASE (Right) DECOMPRESSION  ULNAR NERVE RIGHT ELBOW (Right)  Patient Location: PACU  Anesthesia Type:GA combined with regional for post-op pain  Level of Consciousness: awake, alert , oriented and patient cooperative  Airway & Oxygen Therapy: Patient Spontanous Breathing and Patient connected to face mask oxygen  Post-op Assessment: Report given to RN and Post -op Vital signs reviewed and stable  Post vital signs: Reviewed and stable  Last Vitals:  Filed Vitals:   04/07/15 1103  BP:   Pulse: 100  Temp:   Resp: 23    Complications: No apparent anesthesia complications

## 2015-04-07 NOTE — H&P (Signed)
Danielle [name] is a 59 year-old right-hand dominant female referred by Dr. Louanne Skye for consultation with respect to pain in her right elbow, to a lesser extent the left side.  She is status post cervical decompression in 2015, but has had increasing discomfort in the ulnar nerve distribution with numbness, tingling and pain down her small finger.  She has history of bilateral elbow fractures in 1992.  She does not remember exactly what the injury was.  She is awakened at night 3 to 4 out of 7 nights with numbness and tingling.  She has been wearing splints for  both carpal tunnel and her elbow.  Her elbow splint is flexed. This has been going on for approximately three years . She has had nerve conductions done in February, 2016 revealing carpal tunnel syndrome on her left side, but no changes in the ulnar nerve on either side.  She is status post foraminotomies at C5-6, 6-7 done by Dr. Louanne Skye.  She has history of diabetes, thyroid problems, arthritis and high blood pressure.  No history of gout.   She states that the right elbow pain is constant, this has been injected on several occasions by Dr. Louanne Skye with extremely severe, sharp and aching pain with a feeling of swelling and numbness in her hand.  Activity makes it worse.  She has had her MRI done. This reveals scarring about the ulnar nerve at her elbow with changes. She has the carpal tunnel syndrome. She does have a positive Tinel's at the wrist with symptoms going distally to the fingers  ALLERGIES:   Codeine.  MEDICATIONS:    Levoxyl, Ibuprofen and hydrocodone  SURGICAL HISTORY:    Back surgeries (x10) and one neck surgery  FAMILY MEDICAL HISTORY:   Positive for diabetes, heart disease, high blood pressure and arthritis.  SOCIAL HISTORY:     She smokes half pack a day and is advised to quit with the reasons behind this. She does not drink.  She is widowed.  REVIEW OF SYSTEMS:    Positive for high blood pressure, TIA's, easy bruising, otherwise negative  14 points. Danielle [name] is an 59 y.o. female.   Chief Complaint: carpal tunnel and cubital tunnel right HPI: see above  Past Medical History  Diagnosis Date  . Hypertension   . Glucose intolerance (pre-diabetes)   . History of tobacco abuse   . History of hiatal hernia   . Tachycardia   . 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 Obesity   . Dyslipidemia   . Cervical spondylosis without myelopathy 09/12/2013  . Complication of anesthesia   . PONV (postoperative nausea and vomiting)   . Family history of anesthesia complication     Mother - N/V  . Oral thrush     after back surgeries  . Heart murmur     nothing to be concerned  . Hypothyroidism   . Kidney stone   . Gastroesophageal reflux disease     8./25/15- no problem  . History of blood transfusion   . Diverticulitis   . Stroke     TIA X 2-3; last in 2013  . Diabetes mellitus without complication     diet controlled -no meds    Past Surgical History  Procedure Laterality Date  . Abdominal hysterectomy    . Cholecystectomy    . Tonsillectomy    . Appendectomy    . Cystoscopy w/ stone manipulation    . Cystoscopy w/ ureteral stent placement    .  Back surgery      10 spine surgeries since 1988.  Marland Kitchen Posterior cervical fusion/foraminotomy N/A 07/01/2014    Procedure: RIGHT C5-6 and C6-7 FORAMINOTOMY;  Surgeon: Jessy Oto, MD;  Location: [facility];  Service: Orthopedics;  Laterality: N/A;    Family History  Problem Relation Age of Onset  . Heart attack Father   . Cancer Father   . Diabetes Father   . Heart attack Brother   . Cancer Other   . Coronary artery disease Other   . Stroke Other   . Diabetes Other   . Hypertension Other   . Heart failure Other   . Stroke Mother   . Diabetes Mother   . Cancer Mother    Social History:  reports that she has been smoking Cigarettes.  She has a 21.5 pack-year smoking history. She has never used smokeless tobacco. She reports that she does not drink alcohol or use illicit  drugs.  Allergies:  Allergies  Allergen Reactions  . Cephalexin Other (See Comments)    burns stomach  . Codeine Hives and Nausea And Vomiting  . Ivp Dye [Iodinated Diagnostic Agents]     States had throat swelling, "couldn't breathe", states anaphylaxis. Also states has been given IVP dye since reaction without difficulty.     No prescriptions prior to admission    No results found for this or any previous visit (from the past 48 hour(s)).  No results found.   Pertinent items are noted in HPI.  Height 5\' 5"  (1.651 m), weight 87.091 kg (192 lb).  General appearance: alert, cooperative and appears stated age Head: Normocephalic, without obvious abnormality Neck: no JVD Resp: clear to auscultation bilaterally Cardio: regular rate and rhythm, S1, S2 normal, no murmur, click, rub or gallop GI: soft, non-tender; bowel sounds normal; no masses,  no organomegaly Extremities: numbness right hand Pulses: 2+ and symmetric Skin: Skin color, texture, turgor normal. No rashes or lesions Neurologic: Grossly normal Incision/Wound: na  Assessment/Plan RADIOGRAPHS:    X-rays reveal no increased carrying angle, no significant arthritic changes on either elbow.  DIAGNOSIS:     I suspect this is medial epicondylitis. There may be a component of ulnar neuropathy although her nerve conductions are negative.   We would recommend surgical release of each with decompression possible transposition of the nerve at the elbow and carpal tunnel release at her wrist. Pre, peri and post op care are discussed along with risks and complications. Patient is aware there is no guarantee with surgery, possibility of infection, injury to arteries, nerves, and tendons, incomplete relief and dystrophy. She is advised we are attempting to halt the procedure and hopefully will get better. She is advised there may still be residuals coming from her neck. There is absolutely no guarantees with the ulnar nerve for  improvement. Questions are encouraged and answered to the patient's satisfaction. She is scheduled as an outpatient for decompression possible transposition ulnar nerve right elbow with carpal tunnel release right hand.  Raynor Calcaterra R 04/07/2015, 8:01 AM

## 2015-04-07 NOTE — Anesthesia Postprocedure Evaluation (Signed)
Anesthesia Post Note  Patient: Danielle [name]  Procedure(s) Performed: Procedure(s) (LRB): RIGHT CARPAL TUNNEL RELEASE (Right) DECOMPRESSION  ULNAR NERVE RIGHT ELBOW (Right)  Anesthesia type: General + supraclavicular block  Patient location: PACU  Post pain: Pain level controlled  Post assessment: Post-op Vital signs reviewed  Last Vitals: BP 143/89 mmHg  Pulse 73  Temp(Src) 36.8 C (Oral)  Resp 16  Ht 5\' 5"  (1.651 m)  Wt 196 lb 4 oz (89.018 kg)  BMI 32.66 kg/m2  SpO2 93%  Post vital signs: Reviewed  Level of consciousness: sedated  Complications: No apparent anesthesia complications

## 2015-04-07 NOTE — Discharge Instructions (Addendum)
[facility] Instructions °Hand Surgery ° °Wound Care: °Keep your hand elevated above the level of your heart.  Do not allow it to dangle by your side.  Keep the dressing dry and do not remove it unless your doctor advises you to do so.  He will usually change it at the time of your post-op visit.  Moving your fingers is advised to stimulate circulation but will depend on the site of your surgery.  If you have a splint applied, your doctor will advise you regarding movement. ° °Activity: °Do not drive or operate machinery today.  Rest today and then you may return to your normal activity and work as indicated by your physician. ° °Diet:  °Drink liquids today or eat a light diet.  You may resume a regular diet tomorrow.   ° °General expectations: °Pain for two to three days. °Fingers may become slightly swollen. ° °Call your doctor if any of the following occur: °Severe pain not relieved by pain medication. °Elevated temperature. °Dressing soaked with blood. °Inability to move fingers. °White or bluish color to fingers. ° ° °Post Anesthesia Home Care Instructions ° °Activity: °Get plenty of rest for the remainder of the day. A responsible adult should stay with you for 24 hours following the procedure.  °For the next 24 hours, DO NOT: °-Drive a car °-Operate machinery °-Drink alcoholic beverages °-Take any medication unless instructed by your physician °-Make any legal decisions or sign important papers. ° °Meals: °Start with liquid foods such as gelatin or soup. Progress to regular foods as tolerated. Avoid greasy, spicy, heavy foods. If nausea and/or vomiting occur, drink only clear liquids until the nausea and/or vomiting subsides. Call your physician if vomiting continues. ° °Special Instructions/Symptoms: °Your throat may feel dry or sore from the anesthesia or the breathing tube placed in your throat during surgery. If this causes discomfort, gargle with warm salt water. The discomfort should disappear within 24  hours. ° °If you had a scopolamine patch placed behind your ear for the management of post- operative nausea and/or vomiting: ° °1. The medication in the patch is effective for 72 hours, after which it should be removed.  Wrap patch in a tissue and discard in the trash. Wash hands thoroughly with soap and water. °2. You may remove the patch earlier than 72 hours if you experience unpleasant side effects which may include dry mouth, dizziness or visual disturbances. °3. Avoid touching the patch. Wash your hands with soap and water after contact with the patch. °  °Call your surgeon if you experience:  ° °1.  Fever over 101.0. °2.  Inability to urinate. °3.  Nausea and/or vomiting. °4.  Extreme swelling or bruising at the surgical site. °5.  Continued bleeding from the incision. °6.  Increased pain, redness or drainage from the incision. °7.  Problems related to your pain medication. °8. Any change in color, movement and/or sensation °9. Any problems and/or concerns ° ° °

## 2015-04-07 NOTE — Brief Op Note (Signed)
04/07/2015  11:04 AM  PATIENT:  Saunders Revel  59 y.o. female  PRE-OPERATIVE DIAGNOSIS:  RIGHT CARPAL TUNNEL SYNDROME,RIGHT CUBITAL Gu-Win  POST-OPERATIVE DIAGNOSIS:  RIGHT CARPAL TUNNEL SYNDROME,RIGHT CUBITAL TUNNLEL  PROCEDURE:  Procedure(s): RIGHT CARPAL TUNNEL RELEASE (Right) DECOMPRESSION  ULNAR NERVE RIGHT ELBOW (Right)  SURGEON:  Surgeon(s) and Role:    * Daryll Brod, MD - Primary  PHYSICIAN ASSISTANT:   ASSISTANTS: none   ANESTHESIA:   regional and general  EBL:  Total I/O In: 700 [I.V.:700] Out: -   BLOOD ADMINISTERED:none  DRAINS: none   LOCAL MEDICATIONS USED:  NONE  SPECIMEN:  No Specimen  DISPOSITION OF SPECIMEN:  N/A  COUNTS:  YES  TOURNIQUET:   Total Tourniquet Time Documented: Upper Arm (Right) - 28 minutes Total: Upper Arm (Right) - 28 minutes   DICTATION: .Other Dictation: Dictation Number [id number]  PLAN OF CARE: Discharge to home after PACU  PATIENT DISPOSITION:  PACU - hemodynamically stable.

## 2015-04-07 NOTE — Anesthesia Procedure Notes (Signed)
Procedure Name: LMA Insertion Date/Time: 04/07/2015 10:14 AM Performed by: Nashid Pellum D Pre-anesthesia Checklist: Patient identified, Emergency Drugs available, Suction available and Patient being monitored Patient Re-evaluated:Patient Re-evaluated prior to inductionOxygen Delivery Method: Circle System Utilized Preoxygenation: Pre-oxygenation with 100% oxygen Intubation Type: IV induction Ventilation: Mask ventilation without difficulty LMA: LMA inserted LMA Size: 4.0 Number of attempts: 1 Airway Equipment and Method: Bite block Placement Confirmation: positive ETCO2 Tube secured with: Tape Dental Injury: Teeth and Oropharynx as per pre-operative assessment

## 2015-04-08 ENCOUNTER — Encounter (HOSPITAL_BASED_OUTPATIENT_CLINIC_OR_DEPARTMENT_OTHER): Payer: Self-pay | Admitting: Orthopedic Surgery

## 2015-04-08 NOTE — Op Note (Signed)
NAMEILDA, LASKIN NO.:  [id number]  MEDICAL RECORD NO.:  [id number]  LOCATION:                                 FACILITY:  PHYSICIAN:  Daryll Brod, M.D.       DATE OF BIRTH:  Sep 28, 1956  DATE OF PROCEDURE:  04/07/2015 DATE OF DISCHARGE:                              OPERATIVE REPORT   POSTOPERATIVE DIAGNOSIS:  Carpal tunnel syndrome, cubital tunnel syndrome, right arm, right elbow.  POSTOPERATIVE DIAGNOSIS:  Carpal tunnel syndrome, cubital tunnel syndrome, right arm, right elbow.  OPERATIONS:  Decompression of right median nerve at the wrist, decompression of the right ulnar nerve at the elbow.  SURGEON:  Daryll Brod, M.D.  ANESTHESIA:  Supraclavicular block, general.  HISTORY:  The patient is a 59 year old female with a history of numbness and tingling of her entire hand.  Nerve conductions were positive for carpal tunnel syndrome.  She has markedly positive physical examination for her ulnar nerve at her elbow with a history of a fracture at a younger age.  Does not have an increased carrying angle.  MRI reveals scarring about the nerve.  Pre, peri, and postoperative course have been discussed along with risks and complications.  She is aware that there is no guarantee with the surgery; possibility of infection; recurrence of injury to arteries, nerves, tendons; incomplete relief of symptoms; dystrophy.  She is scheduled for the possibility of transposition to the ulnar nerve in addition.  PROCEDURE IN DETAIL:  The patient was given a supraclavicular block in the preoperative area, brought to the operating room.  General anesthetic carried out without difficulty.  She was prepped using ChloraPrep in supine position, right arm free.  A 3-minute dry time was allowed.  Time-out taken, confirming the patient and procedure.  The limb was exsanguinated with an Esmarch bandage.  Tourniquet placed high on the arm was inflated to 250 mmHg.  A longitudinal  incision was made in the right palm, carried down through subcutaneous tissue.  Bleeders were electrocauterized.  Palmar fascia was split.  Superficial palmar arch identified.  Flexor tendon to the ring and little finger identified to the ulnar side of the median nerve.  The carpal retinaculum was incised with sharp dissection.  Right angle and Sewall retractor were placed between skin and forearm fascia.  The fascia was released for approximately 3 cm proximal to the wrist crease under direct vision. Canal was explored.  Area of compression to the nerve was apparent with moderate scar.  Motor branch entered into muscle distally.  The wound was copiously irrigated with saline.  Then, the skin was closed with interrupted 4-0 nylon sutures.  Separate incision was then made over the medial epicondyle of the right elbow, carried down through subcutaneous tissue.  Bleeders again electrocauterized.  The Osborne fascia was released on its posterior aspect, found to be markedly thickened.  The subcutaneous tissue was dissected free from the underlying fascia of the flexor carpi ulnaris.  Two knee retractors placed.  The fascia was then released.  The muscle split.  KMI carpal tunnel guide was then placed in the deep fascia between the nerve and the deep fascia  and an angled ENT scissors were then used to release the deep fascia of the flexor carpi ulnaris for approximately 8 cm distally.  Right angle and Sewall retractors were placed between skin and fascia and the brachium.  This was replaced with a knee retractor.  The brachial fascia was then identified proximally.  This was then released after protecting the ulnar nerve with a KMI guide and the angled ENT scissors were used to release the fascia up to the ligament of Struthers.  The elbow was placed in full flexion.  No subluxation was noted.  The wound was irrigated.  The Osborne fascia was then sutured to the posterior skin flap, the  subcutaneous tissue.  The incision closed with 2-0 Vicryl sutures and skin with interrupted 4-0 nylon.  Sterile compressive dressing was applied.  On deflation of the tourniquet, all fingers immediately pinked.  She was taken to the recovery room for observation in satisfactory condition.  She will be discharged home to return to the [place] in 1 week on Percocet.          ______________________________ Daryll Brod, M.D.     GK/MEDQ  D:  04/07/2015  T:  04/08/2015  Job:  [id number]

## 2015-05-28 ENCOUNTER — Ambulatory Visit (INDEPENDENT_AMBULATORY_CARE_PROVIDER_SITE_OTHER): Payer: Medicare Other | Admitting: Podiatry

## 2015-05-28 ENCOUNTER — Encounter: Payer: Self-pay | Admitting: Podiatry

## 2015-05-28 VITALS — BP 134/77 | HR 86 | Resp 18

## 2015-05-28 DIAGNOSIS — M722 Plantar fascial fibromatosis: Secondary | ICD-10-CM | POA: Diagnosis not present

## 2015-05-28 DIAGNOSIS — R52 Pain, unspecified: Secondary | ICD-10-CM

## 2015-05-28 MED ORDER — MELOXICAM 15 MG PO TABS: 15.0000 mg | ORAL_TABLET | Freq: Every day | ORAL | Status: DC

## 2015-05-28 NOTE — Progress Notes (Signed)
Subjective:     Patient ID: Danielle [name], female   DOB: [birth year], 59 y.o.   MRN: [id number]  HPIThis patient returns to the office with pain that has returned to both feet.  She was treated by Dr. Valentina Lucks with injection therapy.  She said the shot helped initially but her pain has returned.  She was given an insert by Dr. Rolene Course which she says was only for her heel.  She has pain upon rising in the morning and standing from sitting position.  She returns for continued evaluation and treatment.   Review of Systems     Objective:   Physical Exam GENERAL APPEARANCE: Alert, conversant. Appropriately groomed. No acute distress.  VASCULAR: Pedal pulses palpable at 2/4 DP and PT bilateral.  Capillary refill time is immediate to all digits,  Proximal to distal cooling it warm to warm.  Digital hair growth is present bilateral  NEUROLOGIC: sensation is intact epicritically and protectively to 5.07 monofilament at 5/5 sites bilateral.  Light touch is intact bilateral, vibratory sensation intact bilateral, achilles tendon reflex is intact bilateral.  MUSCULOSKELETAL: acceptable muscle strength, tone and stability bilateral.  Intrinsic muscluature intact bilateral.  Rectus appearance of foot and digits noted bilateral.  Palpable pain at insertion plantar fascia both heels.  Pain is palpated through the course of plantar fascia.  DERMATOLOGIC: skin color, texture, and turgor are within normal limits.  No preulcerative lesions or ulcers  are seen, no interdigital maceration noted.  No open lesions present.  Digital nails are asymptomatic. No drainage noted.      Assessment:     Plantar fascitis B/L     Plan:     ROV  Night splint was dispensed.  Powersteps were dispensed.  Xrays reviewed.  RTC 2 weeks.

## 2015-06-16 ENCOUNTER — Encounter: Payer: Self-pay | Admitting: Podiatry

## 2015-06-16 ENCOUNTER — Ambulatory Visit (INDEPENDENT_AMBULATORY_CARE_PROVIDER_SITE_OTHER): Payer: Medicare Other | Admitting: Podiatry

## 2015-06-16 VITALS — BP 145/86 | HR 94 | Resp 12

## 2015-06-16 DIAGNOSIS — M722 Plantar fascial fibromatosis: Secondary | ICD-10-CM | POA: Diagnosis not present

## 2015-06-16 NOTE — Progress Notes (Signed)
Subjective:     Patient ID: Danielle [name], female   DOB: 1956-03-05, 59 y.o.   MRN: [id number]  HPIThis patient presents saying her heels continue to be painful despite wearing powersteps and using brace.  She says she says she has continued pain 24/7.  She requests further evaluation and treatment.   Review of Systems     Objective:   Physical Exam GENERAL APPEARANCE: Alert, conversant. Appropriately groomed. No acute distress.  VASCULAR: Pedal pulses palpable at  [facility] and PT bilateral.  Capillary refill time is immediate to all digits,  Normal temperature gradient.  Digital hair growth is present bilateral  NEUROLOGIC: sensation is normal to 5.07 monofilament at 5/5 sites bilateral.  Light touch is intact bilateral, Muscle strength normal.  MUSCULOSKELETAL: acceptable muscle strength, tone and stability bilateral.  Intrinsic muscluature intact bilateral.  Rectus appearance of foot and digits noted bilateral. Palpable pain at the insertion plantar fascia both feet.  DERMATOLOGIC: skin color, texture, and turgor are within normal limits.  No preulcerative lesions or ulcers  are seen, no interdigital maceration noted.  No open lesions present.  Digital nails are asymptomatic. No drainage noted.      Assessment:     Plantar Fasciitis B/L     Plan:    ROV  Injection therapy both heels  RTC 2 weeks.

## 2015-06-30 ENCOUNTER — Ambulatory Visit (INDEPENDENT_AMBULATORY_CARE_PROVIDER_SITE_OTHER): Payer: Medicare Other | Admitting: Podiatry

## 2015-06-30 ENCOUNTER — Encounter: Payer: Self-pay | Admitting: Podiatry

## 2015-06-30 VITALS — BP 142/92 | HR 100 | Resp 12

## 2015-06-30 DIAGNOSIS — M722 Plantar fascial fibromatosis: Secondary | ICD-10-CM | POA: Diagnosis not present

## 2015-06-30 NOTE — Progress Notes (Signed)
Subjective:     Patient ID: Danielle [name], female   DOB: 1955-12-08, 59 y.o.   MRN: [id number]  HPIThis patient returns to my office with diagnosis of plantar fascitis both heels.  She has previously been treated with insoles, Mobic, night splints and the pain continued.  She received injections in both heels last visit and she says her right heel is 90% improved.  Her left heel continues to be painful. She says there was no improvement to her right heel.  She returns for continued evaluation and treatment.   Review of Systems     Objective:   Physical Exam GENERAL APPEARANCE: Alert, conversant. Appropriately groomed. No acute distress.  VASCULAR: Pedal pulses palpable at  [facility] and PT bilateral.  Capillary refill time is immediate to all digits,  Normal temperature gradient.  Digital hair growth is present bilateral  NEUROLOGIC: sensation is normal to 5.07 monofilament at 5/5 sites bilateral.  Light touch is intact bilateral, Muscle strength normal.  MUSCULOSKELETAL: acceptable muscle strength, tone and stability bilateral.  Intrinsic muscluature intact bilateral.  Rectus appearance of foot and digits noted bilateral. Palpable pain at insertion plantar fascia right foot.  DERMATOLOGIC: skin color, texture, and turgor are within normal limits.  No preulcerative lesions or ulcers  are seen, no interdigital maceration noted.  No open lesions present.  Digital nails are asymptomatic. No drainage noted.     Assessment:     Plantar Fascitis right heel     Plan:     ROV>  Injection therapy right heel  Continue on Mobic and night splints.  Discussed her plantar fascitis and told her if the pain persists she needs to follow up with Dr. Paulla Dolly for surgical consult.

## 2015-07-03 ENCOUNTER — Encounter: Payer: Self-pay | Admitting: Family Medicine

## 2015-07-03 ENCOUNTER — Ambulatory Visit (INDEPENDENT_AMBULATORY_CARE_PROVIDER_SITE_OTHER): Payer: Medicare Other | Admitting: Family Medicine

## 2015-07-03 VITALS — BP 137/80 | HR 86 | Temp 98.6°F | Ht 65.0 in | Wt 187.2 lb

## 2015-07-03 DIAGNOSIS — K625 Hemorrhage of anus and rectum: Secondary | ICD-10-CM | POA: Diagnosis not present

## 2015-07-03 LAB — HEMOCCULT GUIAC POC 1CARD (OFFICE): FECAL OCCULT BLD: NEGATIVE

## 2015-07-03 LAB — POCT HEMOGLOBIN: HEMOGLOBIN: 14.3 g/dL (ref 12.2–16.2)

## 2015-07-03 MED ORDER — PANTOPRAZOLE SODIUM 40 MG PO TBEC: 40.0000 mg | DELAYED_RELEASE_TABLET | Freq: Every day | ORAL | Status: DC

## 2015-07-03 NOTE — Patient Instructions (Signed)
Stop taking Prilosec for now. Start taking Protonix instead. There was no evidence of blood in your stool today which is reassuring I have referred you to GI, as I believe that it is important for you to have a repeat colonoscopy given your history and the bleeding.  Gastrointestinal Bleeding Gastrointestinal (GI) bleeding means there is bleeding somewhere along the digestive tract, between the mouth and anus. CAUSES  There are many different problems that can cause GI bleeding. Possible causes include:  Esophagitis. This is inflammation, irritation, or swelling of the esophagus.  Hemorrhoids.These are veins that are full of blood (engorged) in the rectum. They cause pain, inflammation, and may bleed.  Anal fissures.These are areas of painful tearing which may bleed. They are often caused by passing hard stool.  Diverticulosis.These are pouches that form on the colon over time, with age, and may bleed significantly.  Diverticulitis.This is inflammation in areas with diverticulosis. It can cause pain, fever, and bloody stools, although bleeding is rare.  Polyps and cancer. Colon cancer often starts out as precancerous polyps.  Gastritis and ulcers.Bleeding from the upper gastrointestinal tract (near the stomach) may travel through the intestines and produce black, sometimes tarry, often bad smelling stools. In certain cases, if the bleeding is fast enough, the stools may not be black, but red. This condition may be life-threatening. SYMPTOMS   Vomiting bright red blood or material that looks like coffee grounds.  Bloody, black, or tarry stools. DIAGNOSIS  Your caregiver may diagnose your condition by taking your history and performing a physical exam. More tests may be needed, including:  X-rays and other imaging tests.  Esophagogastroduodenoscopy (EGD). This test uses a flexible, lighted tube to look at your esophagus, stomach, and small intestine.  Colonoscopy. This test uses  a flexible, lighted tube to look at your colon. TREATMENT  Treatment depends on the cause of your bleeding.   For bleeding from the esophagus, stomach, small intestine, or colon, the caregiver doing your EGD or colonoscopy may be able to stop the bleeding as part of the procedure.  Inflammation or infection of the colon can be treated with medicines.  Many rectal problems can be treated with creams, suppositories, or warm baths.  Surgery is sometimes needed.  Blood transfusions are sometimes needed if you have lost a lot of blood. If bleeding is slow, you may be allowed to go home. If there is a lot of bleeding, you will need to stay in the hospital for observation. HOME CARE INSTRUCTIONS   Take any medicines exactly as prescribed.  Keep your stools soft by eating foods that are high in fiber. These foods include whole grains, legumes, fruits, and vegetables. Prunes (1 to 3 a day) work well for many people.  Drink enough fluids to keep your urine clear or pale yellow. SEEK IMMEDIATE MEDICAL CARE IF:   Your bleeding increases.  You feel lightheaded, weak, or you faint.  You have severe cramps in your back or abdomen.  You pass large blood clots in your stool.  Your problems are getting worse. MAKE SURE YOU:   Understand these instructions.  Will watch your condition.  Will get help right away if you are not doing well or get worse. Document Released: 10/21/2000 Document Revised: 10/10/2012 Document Reviewed: 10/03/2011 [facility] Patient Information 2015 [place], [place]. This information is not intended to replace advice given to you by your health care provider. Make sure you discuss any questions you have with your health care provider.

## 2015-07-03 NOTE — Progress Notes (Signed)
Patient ID: Saunders Revel, female   DOB: May 29, 1956, 59 y.o.   MRN: [id number]    Subjective: CC: abdominal pain, bright red blood per rectum HPI: Patient is a 59 y.o. female with a past medical history of diverticulosis s/p cholecysectomy and appendectomy presenting to clinic today for a same day appt for abdominal pain.  Patient has had abdominal pain intermittently for over a year. Pain starts in the epigastrum and goes down laterally, more prominent on the R than the L. The pain is cramping in sensation, sometimes 10/10 pain.  Oftentimes she vomits with the BM. She intermittently feels better after the BM and emesis.   In general her BMs are soft and brown. Wednesday night she felt pressure like she needed to have a BM and noticed it was bright red blood without fecal material. The second time she went to the bathroom that evening she only passed small blood clots the size of a pea. Never passed this type of blood before, only blood on toilet paper from hemorrhoids. She doesn't feel like she has hemorrhoids currently.   Emesis is clear/green fluid. Usually not chunky and doesn't resemble what she's eaten. BM and emesis are not related to eating. She normally has 2 BM and 2 episodes of emesis per day.   No fevers, chills, myalgias, arthralgias. No light headedness, chest pain, SOB, or syncope like symptoms.  No sick contacts. She uses well water.   Social History: current smoker  Health Maintenance: Per our records she saw Dr. Collene Mares in 2009 for a colonoscopy but we do not have a copy of the actual report. Per PCP notes, it revealed hyperplastic polyps but not certain.   ROS: All other systems reviewed and are negative.  Past Medical History Patient Active Problem List   Diagnosis Date Noted  . Rectal bleeding 07/03/2015  . Abdominal pain, left lower quadrant 02/20/2015  . Abdominal pain, epigastric 12/10/2014  . Oral thrush 11/26/2014  . Abrasion of right arm 09/19/2014  . Dental  abscess 09/05/2014  . Spinal stenosis in cervical region 07/01/2014  . Osteopenia 01/22/2014  . Pain in joint, ankle and foot 12/27/2013  . Cervical spondylosis without myelopathy 09/12/2013  . Diverticulitis large intestine w/o perforation or abscess w/o bleeding 07/25/2013  . Pain in limb 05/08/2013  . Chest pain 03/06/2012  . TIA (transient ischemic attack) 04/20/2011  . DEPRESSION 03/12/2010  . OBESITY 10/08/2009  . CYST AND PSEUDOCYST OF PANCREAS 09/21/2009  . HYPERCHOLESTEROLEMIA 12/10/2008  . ROTATOR CUFF SYNDROME 12/10/2008  . COLONIC POLYPS, HYPERPLASTIC 05/02/2008  . ESSENTIAL HYPERTENSION, BENIGN 09/17/2007  . Palpitations 09/17/2007  . Diabetes mellitus type 2, controlled, without complications 55/97/4163  . Hypothyroidism 01/04/2007  . HYPERCALCEMIA 01/04/2007  . Quit smoking 01/04/2007  . CARPAL TUNNEL SYNDROME 01/04/2007  . GASTROESOPHAGEAL REFLUX, NO ESOPHAGITIS 01/04/2007  . CERVICAL SPINE DISORDER, NOS 01/04/2007  . BACK PAIN W/RADIATION, UNSPECIFIED 01/04/2007    Medications- reviewed and updated Current Outpatient Prescriptions  Medication Sig Dispense Refill  . aspirin 81 MG tablet Take 1 tablet (81 mg total) by mouth daily. 30 tablet   . cholecalciferol (VITAMIN D) 1000 UNITS tablet Take 1,000 Units by mouth daily.    . ciprofloxacin (CIPRO) 500 MG tablet Take 1 tablet (500 mg total) by mouth 2 (two) times daily. 20 tablet 0  . clobetasol cream (TEMOVATE) 0.05 %   0  . HYDROcodone-acetaminophen (NORCO) 7.5-325 MG per tablet TAKE 1 TABLET BY MOUTH EVERY 6 TO 8 HOURS AS NEEDED FOR  PAIN  0  . HYDROcodone-acetaminophen (NORCO/VICODIN) 5-325 MG per tablet Take 1 tablet by mouth every 6 (six) hours as needed for moderate pain. Per ortho, Dr. Louanne Skye    . ibuprofen (ADVIL,MOTRIN) 800 MG tablet Take 800 mg by mouth as needed for headache (pain).     Marland Kitchen levothyroxine (SYNTHROID) 200 MCG tablet Take 1 tablet (200 mcg total) by mouth daily before breakfast. 90 tablet 3    . losartan (COZAAR) 100 MG tablet Take 1 tablet (100 mg total) by mouth daily. 90 tablet 3  . meloxicam (MOBIC) 15 MG tablet Take 1 tablet (15 mg total) by mouth daily. 30 tablet 0  . metoprolol succinate (TOPROL-XL) 100 MG 24 hr tablet Take 1 tablet (100 mg total) by mouth daily. Take with or immediately following a meal. 90 tablet 3  . metroNIDAZOLE (FLAGYL) 500 MG tablet Take 1 tablet (500 mg total) by mouth 2 (two) times daily. Do not drink alcohol while taking this medicine. 20 tablet 0  . nystatin (MYCOSTATIN) 100000 UNIT/ML suspension Take 5 mLs (500,000 Units total) by mouth 4 (four) times daily. 60 mL 0  . omeprazole (PRILOSEC) 40 MG capsule Take 1 capsule (40 mg total) by mouth daily. 30 capsule 3  . oxyCODONE-acetaminophen (PERCOCET) 10-325 MG per tablet Take 1 tablet by mouth every 4 (four) hours as needed for pain. 30 tablet 0  . pantoprazole (PROTONIX) 40 MG tablet Take 1 tablet (40 mg total) by mouth daily. 30 tablet 1   No current facility-administered medications for this visit.    Objective: Office vital signs reviewed. BP 137/80 mmHg  Pulse 86  Temp(Src) 98.6 F (37 C) (Oral)  Ht 5\' 5"  (1.651 m)  Wt 187 lb 3.2 oz (84.913 kg)  BMI 31.15 kg/m2   Physical Examination:  General: Awake, alert, well- nourished, NAD GI: +BS, soft, diffusely tender to palpation, mostly in the epigastric region. Skin tags noted around the anus. No internal/external hemorrhoids or fissures noted on DRE.  Anoscopy revealed brown stool. Old/healed internal hemorrhoids. No new internal or external hemorrhoids noted. No active bleeding noted.   Fecal occult blood: negative POC hemoglobin 14.3  Assessment/Plan: Rectal bleeding Patient presenting with abdominal pain/cramps that seem to arise from the epigastrium and bright red blood per rectum. FOBT was negative here in clinic. Hemoglobin is stable at 14.3. No evidence of internal/external hemorriods or fissures noted on DRE or anoscopy. Given  no diarrhea, this is less likely giardia or some other gastrointestinal infection. Most likely secondary to diverticulosis. Unfortunately we do not have pathology reports or f/u GI reports from her colonscopy report in 2009, however per pt report she should have had a follow up in 5 years.  - Switched from prilosec to protonix. Could consider carafate as abdominal pain seems to arise from epigastrium.  - Bleed was less likely from gastric ulcers given the bright red nature. - Will refer back to GI for a colonoscopy - Return precautions discussed: bleeding increases, lightheadedness, weakness, syncope, severe cramps in your back or abdomen, passing llarge blood clots in your stool, worsening symptoms - RTC in 2 weeks.     Orders Placed This Encounter  Procedures  . Ambulatory referral to Gastroenterology    Referral Priority:  Routine    Referral Type:  Consultation    Referral Reason:  Specialty Services Required    Number of Visits Requested:  1  . Hemoglobin  . Hemoccult - 1 Card (office)    Meds ordered this encounter  Medications  . pantoprazole (PROTONIX) 40 MG tablet    Sig: Take 1 tablet (40 mg total) by mouth daily.    Dispense:  30 tablet    Refill:  [place] PGY-2, [place]

## 2015-07-03 NOTE — Assessment & Plan Note (Addendum)
Patient presenting with abdominal pain/cramps that seem to arise from the epigastrium and bright red blood per rectum. FOBT was negative here in clinic. Hemoglobin is stable at 14.3. No evidence of internal/external hemorriods or fissures noted on DRE or anoscopy. Given no diarrhea, this is less likely giardia or some other gastrointestinal infection. Most likely secondary to diverticulosis. Unfortunately we do not have pathology reports or f/u GI reports from her colonscopy report in 2009, however per pt report she should have had a follow up in 5 years.  - Switched from prilosec to protonix. Could consider carafate as abdominal pain seems to arise from epigastrium.  - Bleed was less likely from gastric ulcers given the bright red nature. - Will refer back to GI for a colonoscopy - Return precautions discussed: bleeding increases, lightheadedness, weakness, syncope, severe cramps in your back or abdomen, passing llarge blood clots in your stool, worsening symptoms - RTC in 2 weeks.

## 2015-07-14 ENCOUNTER — Ambulatory Visit (INDEPENDENT_AMBULATORY_CARE_PROVIDER_SITE_OTHER): Payer: Medicare Other | Admitting: Podiatry

## 2015-07-14 ENCOUNTER — Ambulatory Visit (INDEPENDENT_AMBULATORY_CARE_PROVIDER_SITE_OTHER): Payer: Medicare Other

## 2015-07-14 ENCOUNTER — Encounter: Payer: Self-pay | Admitting: Podiatry

## 2015-07-14 VITALS — BP 137/84 | HR 97 | Resp 16

## 2015-07-14 DIAGNOSIS — R52 Pain, unspecified: Secondary | ICD-10-CM | POA: Diagnosis not present

## 2015-07-14 DIAGNOSIS — M722 Plantar fascial fibromatosis: Secondary | ICD-10-CM

## 2015-07-14 NOTE — Progress Notes (Signed)
Subjective:     Patient ID: Danielle [name], female   DOB: Apr 04, 1956, 59 y.o.   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 Today I went ahead and discussed all the different conservative treatments that she's had done and failure to respond and have recommended endoscopic surgery. Patient wants to have this fixed and at this time I allowed her to read consent form reviewing with her the surgery and all possible alternative treatments and complications. She is comfortable with this wants surgery and signs consent form after extensive review and is dispensed air fracture walker with instructions on usage and also discussed with patient the told recovery. Can take 6 months to one year for this problem

## 2015-07-14 NOTE — Patient Instructions (Signed)
Pre-Operative Instructions  Congratulations, you have decided to take an important step to improving your quality of life.  You can be assured that the doctors of [facility] will be with you every step of the way.  1. Plan to be at the surgery center/hospital at least 1 (one) hour prior to your scheduled time unless otherwise directed by the surgical center/hospital staff.  You must have a responsible adult accompany you, remain during the surgery and drive you home.  Make sure you have directions to the surgical center/hospital and know how to get there on time. 2. For hospital based surgery you will need to obtain a history and physical form from your family physician within 1 month prior to the date of surgery- we will give you a form for you primary physician.  3. We make every effort to accommodate the date you request for surgery.  There are however, times where surgery dates or times have to be moved.  We will contact you as soon as possible if a change in schedule is required.   4. No Aspirin/Ibuprofen for one week before surgery.  If you are on aspirin, any non-steroidal anti-inflammatory medications (Mobic, Aleve, Ibuprofen) you should stop taking it 7 days prior to your surgery.  You make take Tylenol  For pain prior to surgery.  5. Medications- If you are taking daily heart and blood pressure medications, seizure, reflux, allergy, asthma, anxiety, pain or diabetes medications, make sure the surgery center/hospital is aware before the day of surgery so they may notify you which medications to take or avoid the day of surgery. 6. No food or drink after midnight the night before surgery unless directed otherwise by surgical center/hospital staff. 7. No alcoholic beverages 24 hours prior to surgery.  No smoking 24 hours prior to or 24 hours after surgery. 8. Wear loose pants or shorts- loose enough to fit over bandages, boots, and casts. 9. No slip on shoes, sneakers are best. 10. Bring  your boot with you to the surgery center/hospital.  Also bring crutches or a walker if your physician has prescribed it for you.  If you do not have this equipment, it will be provided for you after surgery. 11. If you have not been contracted by the surgery center/hospital by the day before your surgery, call to confirm the date and time of your surgery. 12. Leave-time from work may vary depending on the type of surgery you have.  Appropriate arrangements should be made prior to surgery with your employer. 13. Prescriptions will be provided immediately following surgery by your doctor.  Have these filled as soon as possible after surgery and take the medication as directed. 14. Remove nail polish on the operative foot. 15. Wash the night before surgery.  The night before surgery wash the foot and leg well with the antibacterial soap provided and water paying special attention to beneath the toenails and in between the toes.  Rinse thoroughly with water and dry well with a towel.  Perform this wash unless told not to do so by your physician.  Enclosed: 1 Ice pack (please put in freezer the night before surgery)   1 Hibiclens skin cleaner   Pre-op Instructions  If you have any questions regarding the instructions, do not hesitate to call our office.  [place]: [address] [place], [place] [address] [phone]  [place]: [address] [place], [place] [address] [phone]  [place]: [address]  [place],  [address] [phone]  Dr. Richard   Tuchman DPM, Dr. Jeanett Antonopoulos DPM Dr. Richard Sikora DPM, Dr. M. Todd Hyatt DPM, Dr. Kathryn Egerton DPM 

## 2015-08-10 ENCOUNTER — Telehealth: Payer: Self-pay | Admitting: *Deleted

## 2015-08-10 NOTE — Telephone Encounter (Signed)
"  Cancel appointment for surgery on 08/11/2015."    I'm returning your call.  You want to cancel your surgery.  "Yes, my mother had a stroke so I'm going to have to take care of her.  I'll call back to reschedule later."  I called and canceled surgery at [facility].

## 2015-08-21 ENCOUNTER — Other Ambulatory Visit: Payer: Self-pay

## 2015-08-26 ENCOUNTER — Encounter: Payer: Self-pay | Admitting: Family Medicine

## 2015-08-26 DIAGNOSIS — D125 Benign neoplasm of sigmoid colon: Secondary | ICD-10-CM

## 2015-09-13 ENCOUNTER — Other Ambulatory Visit: Payer: Self-pay | Admitting: Family Medicine

## 2015-09-24 ENCOUNTER — Other Ambulatory Visit: Payer: Self-pay | Admitting: Orthopedic Surgery

## 2015-09-25 ENCOUNTER — Encounter (HOSPITAL_BASED_OUTPATIENT_CLINIC_OR_DEPARTMENT_OTHER): Payer: Self-pay | Admitting: *Deleted

## 2015-09-29 ENCOUNTER — Ambulatory Visit (HOSPITAL_BASED_OUTPATIENT_CLINIC_OR_DEPARTMENT_OTHER): Payer: Medicare Other | Admitting: Certified Registered"

## 2015-09-29 ENCOUNTER — Ambulatory Visit (HOSPITAL_BASED_OUTPATIENT_CLINIC_OR_DEPARTMENT_OTHER)
Admission: RE | Admit: 2015-09-29 | Discharge: 2015-09-29 | Disposition: A | Discharge status: Home / Self Care | Payer: Medicare Other | Source: Ambulatory Visit | Attending: Orthopedic Surgery | Admitting: Orthopedic Surgery

## 2015-09-29 ENCOUNTER — Encounter (HOSPITAL_BASED_OUTPATIENT_CLINIC_OR_DEPARTMENT_OTHER)
Admission: RE | Disposition: A | Discharge status: Home / Self Care | Payer: Self-pay | Source: Ambulatory Visit | Attending: Orthopedic Surgery

## 2015-09-29 ENCOUNTER — Encounter (HOSPITAL_BASED_OUTPATIENT_CLINIC_OR_DEPARTMENT_OTHER): Payer: Self-pay | Admitting: Certified Registered"

## 2015-09-29 DIAGNOSIS — E039 Hypothyroidism, unspecified: Secondary | ICD-10-CM | POA: Insufficient documentation

## 2015-09-29 DIAGNOSIS — Z8249 Family history of ischemic heart disease and other diseases of the circulatory system: Secondary | ICD-10-CM | POA: Diagnosis not present

## 2015-09-29 DIAGNOSIS — Z79899 Other long term (current) drug therapy: Secondary | ICD-10-CM | POA: Insufficient documentation

## 2015-09-29 DIAGNOSIS — R4781 Slurred speech: Secondary | ICD-10-CM | POA: Diagnosis not present

## 2015-09-29 DIAGNOSIS — I639 Cerebral infarction, unspecified: Secondary | ICD-10-CM | POA: Diagnosis not present

## 2015-09-29 DIAGNOSIS — Z833 Family history of diabetes mellitus: Secondary | ICD-10-CM | POA: Insufficient documentation

## 2015-09-29 DIAGNOSIS — I10 Essential (primary) hypertension: Secondary | ICD-10-CM | POA: Diagnosis not present

## 2015-09-29 DIAGNOSIS — G5622 Lesion of ulnar nerve, left upper limb: Secondary | ICD-10-CM | POA: Insufficient documentation

## 2015-09-29 DIAGNOSIS — K219 Gastro-esophageal reflux disease without esophagitis: Secondary | ICD-10-CM | POA: Insufficient documentation

## 2015-09-29 DIAGNOSIS — Z823 Family history of stroke: Secondary | ICD-10-CM | POA: Insufficient documentation

## 2015-09-29 DIAGNOSIS — E119 Type 2 diabetes mellitus without complications: Secondary | ICD-10-CM | POA: Diagnosis not present

## 2015-09-29 DIAGNOSIS — M1990 Unspecified osteoarthritis, unspecified site: Secondary | ICD-10-CM | POA: Diagnosis not present

## 2015-09-29 DIAGNOSIS — E669 Obesity, unspecified: Secondary | ICD-10-CM | POA: Diagnosis not present

## 2015-09-29 DIAGNOSIS — G5602 Carpal tunnel syndrome, left upper limb: Secondary | ICD-10-CM | POA: Insufficient documentation

## 2015-09-29 DIAGNOSIS — E785 Hyperlipidemia, unspecified: Secondary | ICD-10-CM | POA: Insufficient documentation

## 2015-09-29 DIAGNOSIS — Z90710 Acquired absence of both cervix and uterus: Secondary | ICD-10-CM | POA: Diagnosis not present

## 2015-09-29 DIAGNOSIS — Z91041 Radiographic dye allergy status: Secondary | ICD-10-CM | POA: Insufficient documentation

## 2015-09-29 HISTORY — PX: CARPAL TUNNEL RELEASE: SHX101

## 2015-09-29 HISTORY — PX: ULNAR NERVE TRANSPOSITION: SHX2595

## 2015-09-29 SURGERY — CARPAL TUNNEL RELEASE
Anesthesia: General | Site: Wrist | Laterality: Left

## 2015-09-29 MED ORDER — LIDOCAINE HCL (CARDIAC) 20 MG/ML IV SOLN
INTRAVENOUS | Status: DC | PRN
  Administered 2015-09-29: 60 mg via INTRAVENOUS

## 2015-09-29 MED ORDER — HYDROCODONE-ACETAMINOPHEN 5-325 MG PO TABS
1.0000 | ORAL_TABLET | Freq: Once | ORAL | Status: AC
  Administered 2015-09-29: 1 via ORAL

## 2015-09-29 MED ORDER — FENTANYL CITRATE (PF) 100 MCG/2ML IJ SOLN
50.0000 ug | INTRAMUSCULAR | Status: DC | PRN
  Administered 2015-09-29: 50 ug via INTRAVENOUS

## 2015-09-29 MED ORDER — PROPOFOL 500 MG/50ML IV EMUL
INTRAVENOUS | Status: AC
  Filled 2015-09-29: qty 100

## 2015-09-29 MED ORDER — VANCOMYCIN HCL IN DEXTROSE 1-5 GM/200ML-% IV SOLN
1000.0000 mg | INTRAVENOUS | Status: AC
  Administered 2015-09-29: 1000 mg via INTRAVENOUS

## 2015-09-29 MED ORDER — FENTANYL CITRATE (PF) 100 MCG/2ML IJ SOLN
25.0000 ug | INTRAMUSCULAR | Status: DC | PRN
  Administered 2015-09-29 (×4): 50 ug via INTRAVENOUS

## 2015-09-29 MED ORDER — BUPIVACAINE HCL (PF) 0.25 % IJ SOLN
INTRAMUSCULAR | Status: AC
  Filled 2015-09-29: qty 150

## 2015-09-29 MED ORDER — KETOROLAC TROMETHAMINE 30 MG/ML IJ SOLN
INTRAMUSCULAR | Status: AC
  Filled 2015-09-29: qty 1

## 2015-09-29 MED ORDER — DEXAMETHASONE SODIUM PHOSPHATE 10 MG/ML IJ SOLN
INTRAMUSCULAR | Status: DC | PRN
  Administered 2015-09-29: 4 mg via INTRAVENOUS

## 2015-09-29 MED ORDER — BUPIVACAINE HCL (PF) 0.25 % IJ SOLN
INTRAMUSCULAR | Status: DC | PRN
  Administered 2015-09-29: 10 mL

## 2015-09-29 MED ORDER — PROMETHAZINE HCL 25 MG/ML IJ SOLN
INTRAMUSCULAR | Status: AC
  Filled 2015-09-29: qty 1

## 2015-09-29 MED ORDER — ONDANSETRON HCL 4 MG/2ML IJ SOLN
INTRAMUSCULAR | Status: DC | PRN
  Administered 2015-09-29: 4 mg via INTRAVENOUS

## 2015-09-29 MED ORDER — CHLORHEXIDINE GLUCONATE 4 % EX LIQD: 60.0000 mL | Freq: Once | CUTANEOUS | Status: DC

## 2015-09-29 MED ORDER — LIDOCAINE HCL (CARDIAC) 20 MG/ML IV SOLN
INTRAVENOUS | Status: AC
  Filled 2015-09-29: qty 5

## 2015-09-29 MED ORDER — FENTANYL CITRATE (PF) 100 MCG/2ML IJ SOLN
INTRAMUSCULAR | Status: AC
  Filled 2015-09-29: qty 2

## 2015-09-29 MED ORDER — HYDROCODONE-ACETAMINOPHEN 5-325 MG PO TABS
ORAL_TABLET | ORAL | Status: AC
  Filled 2015-09-29: qty 1

## 2015-09-29 MED ORDER — VANCOMYCIN HCL IN DEXTROSE 1-5 GM/200ML-% IV SOLN
INTRAVENOUS | Status: AC
  Filled 2015-09-29: qty 200

## 2015-09-29 MED ORDER — KETOROLAC TROMETHAMINE 30 MG/ML IJ SOLN
30.0000 mg | Freq: Once | INTRAMUSCULAR | Status: AC
  Administered 2015-09-29: 30 mg via INTRAVENOUS

## 2015-09-29 MED ORDER — HYDROCODONE-ACETAMINOPHEN 5-325 MG PO TABS: 1.0000 | ORAL_TABLET | Freq: Four times a day (QID) | ORAL | Status: DC | PRN

## 2015-09-29 MED ORDER — ONDANSETRON HCL 4 MG/2ML IJ SOLN
INTRAMUSCULAR | Status: AC
  Filled 2015-09-29: qty 2

## 2015-09-29 MED ORDER — LACTATED RINGERS IV SOLN
INTRAVENOUS | Status: DC
  Administered 2015-09-29 (×2): via INTRAVENOUS

## 2015-09-29 MED ORDER — PROPOFOL 10 MG/ML IV BOLUS
INTRAVENOUS | Status: DC | PRN
  Administered 2015-09-29: 200 mg via INTRAVENOUS

## 2015-09-29 MED ORDER — GLYCOPYRROLATE 0.2 MG/ML IJ SOLN: 0.2000 mg | Freq: Once | INTRAMUSCULAR | Status: DC | PRN

## 2015-09-29 MED ORDER — DEXAMETHASONE SODIUM PHOSPHATE 10 MG/ML IJ SOLN
INTRAMUSCULAR | Status: AC
  Filled 2015-09-29: qty 1

## 2015-09-29 MED ORDER — SCOPOLAMINE 1 MG/3DAYS TD PT72
1.0000 | MEDICATED_PATCH | Freq: Once | TRANSDERMAL | Status: DC | PRN
Start: 2015-09-29 — End: 2015-09-29

## 2015-09-29 MED ORDER — MIDAZOLAM HCL 2 MG/2ML IJ SOLN
1.0000 mg | INTRAMUSCULAR | Status: DC | PRN
  Administered 2015-09-29: 2 mg via INTRAVENOUS

## 2015-09-29 MED ORDER — PROMETHAZINE HCL 25 MG/ML IJ SOLN
6.2500 mg | INTRAMUSCULAR | Status: DC | PRN
  Administered 2015-09-29: 6.25 mg via INTRAVENOUS

## 2015-09-29 MED ORDER — MIDAZOLAM HCL 2 MG/2ML IJ SOLN
INTRAMUSCULAR | Status: AC
  Filled 2015-09-29: qty 2

## 2015-09-29 SURGICAL SUPPLY — 49 items
BLADE MINI RND TIP GREEN BEAV (BLADE) ×4 IMPLANT
BLADE SURG 15 STRL LF DISP TIS (BLADE) ×2 IMPLANT
BLADE SURG 15 STRL SS (BLADE) ×2
BNDG COHESIVE 3X5 TAN STRL LF (GAUZE/BANDAGES/DRESSINGS) ×4 IMPLANT
BNDG ESMARK 4X9 LF (GAUZE/BANDAGES/DRESSINGS) ×4 IMPLANT
BNDG GAUZE ELAST 4 BULKY (GAUZE/BANDAGES/DRESSINGS) ×4 IMPLANT
CHLORAPREP W/TINT 26ML (MISCELLANEOUS) ×4 IMPLANT
CORDS BIPOLAR (ELECTRODE) ×4 IMPLANT
COVER BACK TABLE 60X90IN (DRAPES) ×4 IMPLANT
COVER MAYO STAND STRL (DRAPES) ×4 IMPLANT
CUFF TOURN SGL LL 18 NRW (TOURNIQUET CUFF) ×4 IMPLANT
CUFF TOURNIQUET SINGLE 18IN (TOURNIQUET CUFF) IMPLANT
DECANTER SPIKE VIAL GLASS SM (MISCELLANEOUS) IMPLANT
DRAPE EXTREMITY T 121X128X90 (DRAPE) ×4 IMPLANT
DRAPE SURG 17X23 STRL (DRAPES) ×4 IMPLANT
DRSG PAD ABDOMINAL 8X10 ST (GAUZE/BANDAGES/DRESSINGS) ×4 IMPLANT
GAUZE SPONGE 4X4 12PLY STRL (GAUZE/BANDAGES/DRESSINGS) ×4 IMPLANT
GAUZE SPONGE 4X4 16PLY XRAY LF (GAUZE/BANDAGES/DRESSINGS) IMPLANT
GAUZE XEROFORM 1X8 LF (GAUZE/BANDAGES/DRESSINGS) ×4 IMPLANT
GLOVE BIO SURGEON STRL SZ 6.5 (GLOVE) ×3 IMPLANT
GLOVE BIO SURGEONS STRL SZ 6.5 (GLOVE) ×1
GLOVE BIOGEL PI IND STRL 7.0 (GLOVE) ×2 IMPLANT
GLOVE BIOGEL PI IND STRL 8.5 (GLOVE) ×2 IMPLANT
GLOVE BIOGEL PI INDICATOR 7.0 (GLOVE) ×2
GLOVE BIOGEL PI INDICATOR 8.5 (GLOVE) ×2
GLOVE SURG ORTHO 8.0 STRL STRW (GLOVE) ×4 IMPLANT
GOWN STRL REUS W/ TWL LRG LVL3 (GOWN DISPOSABLE) IMPLANT
GOWN STRL REUS W/TWL LRG LVL3 (GOWN DISPOSABLE)
GOWN STRL REUS W/TWL XL LVL3 (GOWN DISPOSABLE) ×8 IMPLANT
LOOP VESSEL MAXI BLUE (MISCELLANEOUS) IMPLANT
NEEDLE PRECISIONGLIDE 27X1.5 (NEEDLE) ×4 IMPLANT
NS IRRIG 1000ML POUR BTL (IV SOLUTION) ×4 IMPLANT
PACK BASIN DAY SURGERY FS (CUSTOM PROCEDURE TRAY) ×4 IMPLANT
PAD CAST 3X4 CTTN HI CHSV (CAST SUPPLIES) IMPLANT
PAD CAST 4YDX4 CTTN HI CHSV (CAST SUPPLIES) ×2 IMPLANT
PADDING CAST COTTON 3X4 STRL (CAST SUPPLIES)
PADDING CAST COTTON 4X4 STRL (CAST SUPPLIES) ×2
SLEEVE SCD COMPRESS KNEE MED (MISCELLANEOUS) ×4 IMPLANT
SPLINT PLASTER CAST XFAST 3X15 (CAST SUPPLIES) IMPLANT
SPLINT PLASTER XTRA FASTSET 3X (CAST SUPPLIES)
STOCKINETTE 4X48 STRL (DRAPES) ×4 IMPLANT
SUT ETHILON 4 0 PS 2 18 (SUTURE) ×4 IMPLANT
SUT VIC AB 2-0 SH 27 (SUTURE) ×2
SUT VIC AB 2-0 SH 27XBRD (SUTURE) ×2 IMPLANT
SUT VICRYL 4-0 PS2 18IN ABS (SUTURE) ×4 IMPLANT
SYR BULB 3OZ (MISCELLANEOUS) ×4 IMPLANT
SYR CONTROL 10ML LL (SYRINGE) ×4 IMPLANT
TOWEL OR 17X24 6PK STRL BLUE (TOWEL DISPOSABLE) ×4 IMPLANT
UNDERPAD 30X30 (UNDERPADS AND DIAPERS) ×4 IMPLANT

## 2015-09-29 NOTE — Transfer of Care (Signed)
Immediate Anesthesia Transfer of Care Note  Patient: Danielle [name]  Procedure(s) Performed: Procedure(s): CARPAL TUNNEL RELEASE LEFT  (Left) DECOMPRESSION  ULNAR NERVE LEFT ELBOW (Left)  Patient Location: PACU  Anesthesia Type:General  Level of Consciousness: awake and patient cooperative  Airway & Oxygen Therapy: Patient Spontanous Breathing and Patient connected to face mask oxygen  Post-op Assessment: Report given to RN and Post -op Vital signs reviewed and stable  Post vital signs: Reviewed and stable  Last Vitals:  Filed Vitals:   09/29/15 0730  BP: 141/92  Pulse: 83  Temp: 36.6 C  Resp: 20    Complications: No apparent anesthesia complications

## 2015-09-29 NOTE — H&P (Signed)
Danielle [name] is a 59 year-old right-hand dominant female referred by Dr. Louanne Skye for consultation with respect to pain in her right elbow, to a lesser extent the left side.  She is status post cervical decompression in 2015, but has had increasing discomfort in the ulnar nerve distribution with numbness, tingling and pain down her small finger.  She has history of bilateral elbow fractures in 1992.  She does not remember exactly what the injury was.  She is awakened at night 3 to 4 out of 7 nights with numbness and tingling.  She has been wearing splints for  both carpal tunnel and her elbow.  Her elbow splint is flexed. This has been going on for approximately three years . She has had nerve conductions done in February, 2016 revealing carpal tunnel syndrome on her left side, but no changes in the ulnar nerve on either side.  She is status post foraminotomies at C5-6, 6-7 done by Dr. Louanne Skye.  She has history of diabetes, thyroid problems, arthritis and high blood pressure.  No history of gout.   She states that the right elbow pain is constant, this has been injected on several occasions by Dr. Louanne Skye with extremely severe, sharp and aching pain with a feeling of swelling and numbness in her hand.  Activity makes it worse.  She has undergone caropal tunnel release in the right . She desires to proceed with carpal tunnel and cubital tunnel on the left. She has had her MRI done revealing swelling of her ulnar nerve at her elbow left side. She continues to have symptoms radial, median and ulnar nerve distribution with positive nerve conductions at the carpal canal on her left side.   ALLERGIES:   Codeine.  MEDICATIONS:    Levoxyl, Ibuprofen and hydrocodone  SURGICAL HISTORY:    Back surgeries (x10) and one neck surgery  FAMILY MEDICAL HISTORY:   Positive for diabetes, heart disease, high blood pressure and arthritis.  SOCIAL HISTORY:     She smokes half pack a day and is advised to quit with the reasons behind  this. She does not drink.  She is widowed.  REVIEW OF SYSTEMS:    Positive for high blood pressure, TIA's, easy bruising, otherwise negative 14 points. Danielle [name] is an 59 y.o. female.   Chief Complaint: numbness left hand HPI: see above  Past Medical History  Diagnosis Date  . Hypertension   . Glucose intolerance (pre-diabetes)   . History of tobacco abuse   . History of hiatal hernia   . Tachycardia   . 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 Obesity   . Dyslipidemia   . Cervical spondylosis without myelopathy 09/12/2013  . Complication of anesthesia   . PONV (postoperative nausea and vomiting)   . Family history of anesthesia complication     Mother - N/V  . Oral thrush     after back surgeries  . Heart murmur     nothing to be concerned  . Hypothyroidism   . Kidney stone   . Gastroesophageal reflux disease     8./25/15- no problem  . History of blood transfusion   . Diverticulitis   . Stroke [facility])     TIA X 2-3; last in 2013  . Diabetes mellitus without complication ([place])     diet controlled -no meds    Past Surgical History  Procedure Laterality Date  . Abdominal hysterectomy    . Cholecystectomy    . Tonsillectomy    .  Appendectomy    . Cystoscopy w/ stone manipulation    . Cystoscopy w/ ureteral stent placement    . Back surgery      10 spine surgeries since 1988.  Marland Kitchen Posterior cervical fusion/foraminotomy N/A 07/01/2014    Procedure: RIGHT C5-6 and C6-7 FORAMINOTOMY;  Surgeon: Jessy Oto, MD;  Location: [place];  Service: Orthopedics;  Laterality: N/A;  . Carpal tunnel release Right 04/07/2015    Procedure: RIGHT CARPAL TUNNEL RELEASE;  Surgeon: Daryll Brod, MD;  Location: [place];  Service: Orthopedics;  Laterality: Right;  . Ulnar nerve transposition Right 04/07/2015    Procedure: DECOMPRESSION  ULNAR NERVE RIGHT ELBOW;  Surgeon: Daryll Brod, MD;  Location: [place];  Service: Orthopedics;  Laterality: Right;    Family  History  Problem Relation Age of Onset  . Heart attack Father   . Cancer Father   . Diabetes Father   . Heart attack Brother   . Cancer Other   . Coronary artery disease Other   . Stroke Other   . Diabetes Other   . Hypertension Other   . Heart failure Other   . Stroke Mother   . Diabetes Mother   . Cancer Mother    Social History:  reports that she has been smoking Cigarettes.  She has a 21.5 pack-year smoking history. She has never used smokeless tobacco. She reports that she does not drink alcohol or use illicit drugs.  Allergies:  Allergies  Allergen Reactions  . Cephalexin Other (See Comments)    burns stomach  . Codeine Hives and Nausea And Vomiting  . Ivp Dye [Iodinated Diagnostic Agents]     States had throat swelling, "couldn't breathe", states anaphylaxis. Also states has been given IVP dye since reaction without difficulty.     Medications Prior to Admission  Medication Sig Dispense Refill  . ibuprofen (ADVIL,MOTRIN) 800 MG tablet Take 800 mg by mouth as needed for headache (pain).     Marland Kitchen levothyroxine (SYNTHROID, LEVOTHROID) 200 MCG tablet TAKE 1 TABLET BY MOUTH EVERY DAY BEFORE BREAKFAST 90 tablet 3  . metoprolol succinate (TOPROL-XL) 100 MG 24 hr tablet Take 1 tablet (100 mg total) by mouth daily. Take with or immediately following a meal. 90 tablet 3  . clobetasol cream (TEMOVATE) 0.05 %   0  . HYDROcodone-acetaminophen (NORCO) 7.5-325 MG per tablet TAKE 1 TABLET BY MOUTH EVERY 6 TO 8 HOURS AS NEEDED FOR PAIN  0  . pantoprazole (PROTONIX) 40 MG tablet Take 1 tablet (40 mg total) by mouth daily. 30 tablet 1    No results found for this or any previous visit (from the past 48 hour(s)).  No results found.   Pertinent items are noted in HPI.  Blood pressure 141/92, pulse 83, temperature 97.8 F (36.6 C), temperature source Oral, resp. rate 20, height 5\' 5"  (1.651 m), weight 88.168 kg (194 lb 6 oz), SpO2 96 %.  General appearance: alert and cooperative Head:  Normocephalic, without obvious abnormality Neck: no JVD Resp: clear to auscultation bilaterally Cardio: regular rate and rhythm, S1, S2 normal, no murmur, click, rub or gallop GI: soft, non-tender; bowel sounds normal; no masses,  no organomegaly Extremities: numbness left hand Pulses: 2+ and symmetric Skin: Skin color, texture, turgor normal. No rashes or lesions Neurologic: Grossly normal Incision/Wound: na  Assessment/Plan Diagnosis: carpal tunnel and cubital tunnel left She would like to proceed to have this released having had the right side done in the past.  She is aware that there is no guarantee with the surgery, the possibility of infection, recurrence, injury to arteries, nerves, tendons, incomplete relief of symptoms and dystrophy.  She is scheduled for decompression, possible transposition, left ulnar nerve, carpal tunnel release, left hand as an outpatient under regional anesthesia.  Kaula Klenke R 09/29/2015, 8:23 AM

## 2015-09-29 NOTE — Discharge Instructions (Addendum)

## 2015-09-29 NOTE — Anesthesia Postprocedure Evaluation (Signed)
Anesthesia Post Note  Patient: Danielle [name]  Procedure(s) Performed: Procedure(s) (LRB): CARPAL TUNNEL RELEASE LEFT  (Left) DECOMPRESSION  ULNAR NERVE LEFT ELBOW (Left)  Patient location during evaluation: PACU Anesthesia Type: General Level of consciousness: awake Pain management: pain level controlled Vital Signs Assessment: post-procedure vital signs reviewed and stable Respiratory status: spontaneous breathing Anesthetic complications: no    Last Vitals:  Filed Vitals:   09/29/15 0933 09/29/15 0945  BP: 119/76 137/85  Pulse: 84 85  Temp: 36.8 C   Resp: 16 14    Last Pain:  Filed Vitals:   09/29/15 0948  PainSc: 0-No pain                 Hoy Fallert S

## 2015-09-29 NOTE — Brief Op Note (Signed)
09/29/2015  9:36 AM  PATIENT:  Danielle [name]  59 y.o. female  PRE-OPERATIVE DIAGNOSIS:  CARPAL TUNNEL LEFT, CUBITAL TUNNEL LEFT  POST-OPERATIVE DIAGNOSIS:  CARPAL TUNNEL LEFT, CUBITAL TUNNEL LEFT  PROCEDURE:  Procedure(s): CARPAL TUNNEL RELEASE LEFT  (Left) DECOMPRESSION  ULNAR NERVE LEFT ELBOW (Left)  SURGEON:  Surgeon(s) and Role:    * Daryll Brod, MD - Primary  PHYSICIAN ASSISTANT:   ASSISTANTS: none   ANESTHESIA:   local and general  EBL:  Total I/O In: 800 [I.V.:800] Out: -   BLOOD ADMINISTERED:none  DRAINS: none   LOCAL MEDICATIONS USED:  BUPIVICAINE   SPECIMEN:  No Specimen  DISPOSITION OF SPECIMEN:  N/A  COUNTS:  YES  TOURNIQUET:   Total Tourniquet Time Documented: Upper Arm (Left) - 37 minutes Total: Upper Arm (Left) - 37 minutes   DICTATION: .Other Dictation: Dictation Number [id number]  PLAN OF CARE: Discharge to home after PACU  PATIENT DISPOSITION:  PACU - hemodynamically stable.

## 2015-09-29 NOTE — Anesthesia Preprocedure Evaluation (Addendum)
Anesthesia Evaluation  Patient identified by MRN, date of birth, ID band Patient awake    Reviewed: Allergy & Precautions, NPO status , Patient's Chart, lab work & pertinent test results  History of Anesthesia Complications (+) PONV and history of anesthetic complications  Airway Mallampati: II  TM Distance: >3 FB Neck ROM: Full    Dental no notable dental hx.    Pulmonary Current Smoker,    Pulmonary exam normal breath sounds clear to auscultation       Cardiovascular hypertension, Pt. on home beta blockers and Pt. on medications Normal cardiovascular exam Rhythm:Regular Rate:Normal     Neuro/Psych PSYCHIATRIC DISORDERS Depression TIA Neuromuscular disease negative psych ROS   GI/Hepatic negative GI ROS, Neg liver ROS,   Endo/Other  negative endocrine ROSdiabetes  Renal/GU negative Renal ROS  negative genitourinary   Musculoskeletal negative musculoskeletal ROS (+) Arthritis ,   Abdominal   Peds negative pediatric ROS (+)  Hematology negative hematology ROS (+)   Anesthesia Other Findings   Reproductive/Obstetrics negative OB ROS                            Anesthesia Physical Anesthesia Plan  ASA: III  Anesthesia Plan: General   Post-op Pain Management:    Induction: Intravenous  Airway Management Planned: LMA  Additional Equipment:   Intra-op Plan:   Post-operative Plan: Extubation in OR  Informed Consent: I have reviewed the patients History and Physical, chart, labs and discussed the procedure including the risks, benefits and alternatives for the proposed anesthesia with the patient or authorized representative who has indicated his/her understanding and acceptance.   Dental advisory given  Plan Discussed with: CRNA and Surgeon  Anesthesia Plan Comments:         Anesthesia Quick Evaluation

## 2015-09-29 NOTE — Op Note (Signed)
Dictation Number [id number]

## 2015-09-29 NOTE — Anesthesia Procedure Notes (Signed)
Procedure Name: LMA Insertion Date/Time: 09/29/2015 8:34 AM Performed by: Jovita Persing D Pre-anesthesia Checklist: Patient identified, Emergency Drugs available, Suction available and Patient being monitored Patient Re-evaluated:Patient Re-evaluated prior to inductionOxygen Delivery Method: Circle System Utilized Preoxygenation: Pre-oxygenation with 100% oxygen Intubation Type: IV induction Ventilation: Mask ventilation without difficulty LMA: LMA inserted LMA Size: 4.0 Number of attempts: 1 Airway Equipment and Method: Bite block Placement Confirmation: positive ETCO2 Tube secured with: Tape Dental Injury: Teeth and Oropharynx as per pre-operative assessment

## 2015-09-30 ENCOUNTER — Encounter (HOSPITAL_BASED_OUTPATIENT_CLINIC_OR_DEPARTMENT_OTHER): Payer: Self-pay | Admitting: Orthopedic Surgery

## 2015-09-30 NOTE — Op Note (Signed)
NAMECERITA, GREGGS NO.:  [id number]  MEDICAL RECORD NO.:  [id number]  LOCATION:                               FACILITY:  [facility]  PHYSICIAN:  Daryll Brod, M.D.       DATE OF BIRTH:  Dec 11, 1955  DATE OF PROCEDURE:  09/29/2015 DATE OF DISCHARGE:  09/29/2015                              OPERATIVE REPORT   PREOPERATIVE DIAGNOSES:  Cubital tunnel syndrome, carpal tunnel syndrome, left arm.  POSTOPERATIVE DIAGNOSES:  Cubital tunnel syndrome, carpal tunnel syndrome, left arm.  OPERATION:  Decompression of median nerve, left wrist.  Decompression of ulnar nerve, left elbow.  SURGEON:  Daryll Brod, M.D.  ANESTHESIA:  General with local infiltration.  HISTORY:  The patient is a 59 year old female with a history of numbness and tingling in her hand.  Nerve conductions revealed carpal tunnel syndrome.  She had complaints of her ulnar nerve distribution, but nerve conductions were normal.  An MRI was done revealing a markedly swollen ulnar nerve at her elbow.  It was decided to proceed with decompression of each, in that the carpal tunnel had not responded to conservative treatment nor had the elbow.  Pre, peri and postoperative course have been discussed along with risks and complications.  She is aware that there was no guarantee with the surgery; possibility of infection; recurrence of injury to arteries, nerves, tendons; incomplete relief of symptoms and dystrophy.  In the preoperative area, the patient is seen, the extremity marked by both the patient and surgeon and antibiotic given.  DESCRIPTION OF PROCEDURE:  The patient was brought to the operating room, where a general anesthetic was carried out without difficulty. She was prepped using ChloraPrep, supine position with left arm free.  A 3-minute dry time was allowed.  Time-out taken, confirming the patient and procedure.  The limb was exsanguinated with an Esmarch bandage. Tourniquet placed high on the arm  was inflated to 250 mmHg.  A longitudinal incision was made in the left palm, carried down through the subcutaneous tissue.  Bleeders were electrocauterized with bipolar. The palmar fascia was split.  The superficial palmar arch was identified.  The flexor tendon to the ring finger was identified. To the ulnar side of the median nerve the carpal retinaculum was incised with sharp dissection.  Right angle and Sewall retractor were placed between the skin and forearm fascia.  The fascia was released for approximately 2 cm proximal to the wrist crease under direct vision.  Canal was explored.  An area of compression to the nerve was apparent.  The motor branch entered into muscle centrally, was intact over its entire course.  The wound was copiously irrigated with saline and the skin was closed with interrupted 4-0 nylon sutures.  A separate incision was then made on the left elbow, medial side, carried down through the subcutaneous tissue. The ulnar nerve was identified after dissecting the subcutaneous tissue, electrocauterizing the bleeder with bipolar protecting the posterior branches of the medial antebrachial cutaneous nerve of the forearm.  The ulnar nerve was found to be markedly compressed with a swollen area just at the entrance of the nerve into the fascia of the  flexor carpi ulnaris tendon.  The nerve was then released distally by making a blunt dissection between the subcutaneous tissue and superficial fascia of the flexor carpi ulnaris.  Two muscle bellies of the flexor carpi ulnaris were then bluntly split.  The deep fascia was then released using a KMI carpal tunnel guide and a right-angled ENT scissors for approximately 8 cm distally.  The attention was then directed proximally.The nerve was found to be encased in scar proximally, this was released.  The brachial fascia was separated from the overlying skin and subcutaneous tissue.  A knee retractor was placed proximally  allowing visualization.  The [facility] guide was then used to protect the nerve and release the fascia proximally to the arcade of [place].  The elbow was fully flexed, no subluxation to the nerve was noted.  The wounds were copiously irrigated with saline.  The deep fascia was closed to the posterior skin flap with 2-0 Vicryl sutures.  The subcutaneous tissue was closed with interrupted 4-0 Vicryl and skin with interrupted 4-0 nylon sutures.  Local infiltration of 0.25% bupivacaine without epinephrine was given, approximately 10 mL total for the two incisions was given.  A sterile compressive dressing was applied to each wound.  On deflation of the tourniquet, all fingers were immediately pinked and she was taken to the recovery room for observation in satisfactory condition.  She will be discharged to home to return to the [facility] in 1 week, on Norco.          ______________________________ Daryll Brod, M.D.     GK/MEDQ  D:  09/29/2015  T:  09/29/2015  Job:  [id number]

## 2015-10-05 ENCOUNTER — Telehealth: Payer: Self-pay | Admitting: *Deleted

## 2015-10-05 NOTE — Telephone Encounter (Signed)
Per Theo Dills in call center, patient canceled her surgery for tomorrow.  Levada Dy stated patient fell over the weekend and broke her arm.  She said she didn't want to have to deal with foot and arm.  Arm is in a cast.    I called and informed Caren Griffins at [facility].  I attempted to call patient but was informed there was no one there of that name.

## 2015-10-16 ENCOUNTER — Other Ambulatory Visit: Payer: Self-pay

## 2015-11-10 ENCOUNTER — Telehealth: Payer: Self-pay | Admitting: Family Medicine

## 2015-11-10 MED ORDER — NYSTATIN 100000 UNIT/ML MT SUSP: 5.0000 mL | Freq: Four times a day (QID) | OROMUCOSAL | Status: DC

## 2015-11-10 NOTE — Telephone Encounter (Signed)
Pt called because she needs a prescription for thrush. She would like this called in to the [facility] on [address]. Please inform patient if we can or cannot do this. jw

## 2015-11-18 ENCOUNTER — Ambulatory Visit (INDEPENDENT_AMBULATORY_CARE_PROVIDER_SITE_OTHER): Payer: Medicare Other | Admitting: Family Medicine

## 2015-11-18 ENCOUNTER — Encounter: Payer: Self-pay | Admitting: Family Medicine

## 2015-11-18 VITALS — BP 138/104 | HR 103 | Temp 98.5°F | Wt 194.6 lb

## 2015-11-18 DIAGNOSIS — M4802 Spinal stenosis, cervical region: Secondary | ICD-10-CM

## 2015-11-18 DIAGNOSIS — M25511 Pain in right shoulder: Secondary | ICD-10-CM

## 2015-11-18 DIAGNOSIS — M47812 Spondylosis without myelopathy or radiculopathy, cervical region: Secondary | ICD-10-CM

## 2015-11-18 DIAGNOSIS — R110 Nausea: Secondary | ICD-10-CM

## 2015-11-18 DIAGNOSIS — M25519 Pain in unspecified shoulder: Secondary | ICD-10-CM | POA: Insufficient documentation

## 2015-11-18 MED ORDER — PROMETHAZINE HCL 25 MG PO TABS: 12.5000 mg | ORAL_TABLET | Freq: Three times a day (TID) | ORAL | Status: DC | PRN

## 2015-11-18 MED ORDER — ONDANSETRON HCL 4 MG PO TABS: 4.0000 mg | ORAL_TABLET | Freq: Three times a day (TID) | ORAL | Status: DC | PRN

## 2015-11-18 MED ORDER — PREDNISONE 20 MG PO TABS: ORAL_TABLET | ORAL | Status: DC

## 2015-11-18 NOTE — Patient Instructions (Signed)
Thank you for coming in to clinic today.  1. I do not see a clear cause of your Right Shoulder / Arm Pain, however given your extensive history with Arthritis and Spinal Stenosis in your Neck, also with Shoulder Arthritis, I think that this could be nerve pain. 2. Trial on prednisone taper over 12 days. Reduce dose every 4 days. 3. May continue Ibuprofen 400 to 800mg  every 8 hours as needed (with food and water), may also take Tylenol 500 to 1000mg  between doses every 6 to 8 hours, max dose 3000mg  in 24 hours. 4. Try heating pad or moist hot towel to soothe muscles as well. 5. Avoid heavy lifting or strenuous activity  Follow-up with Dr Linus Orn [facility] Spine Surgery within next 10-14 days to discuss this problem since initial steroid shot may not have resolved it. Need to make sure you don't need any further imaging or surgical evaluation.  Please schedule a follow-up appointment with Dr Andria Frames in 2 to 4 weeks to follow-up Right Shoulder / Arm pain  If you have any other questions or concerns, please feel free to call the clinic to contact me. You may also schedule an earlier appointment if necessary.  However, if your symptoms get significantly worse, please go to the Emergency Department to seek immediate medical attention.  Nobie Putnam, [place]

## 2015-11-18 NOTE — Assessment & Plan Note (Addendum)
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 With radiation down Right arm but no discrete neck pain. No clear radicular symptoms with paresthesias or weakness. Right shoulder rotator cuff appears intact without weakness, testing normal. Recently had R-shoulder steroid injection 2 weeks prior for similar but not as severe pain. - Differential dx: concern radiating pain from C-spinal stenosis with radiculopathy vs Right rotator cuff bursitis flare, also include potential herpes zoster shingles (no evidence of rash but acute onset, may develop later). H/o TIA but no neurological deficits, and not consistent with CVA/TIA. - Last imaging C-spine MRI 01/2015 reviewed report with multiple level stenosis C5-6, C6-7, C4-5  Plan: 1. Prednisone taper 12 days 20mg  tabs - 40mg  x 4 days, 20mg  x 4 days, 10mg  x 4 days 2. Continue Ibuprofen / May take Tylenol Ex Str 1-2 q 6 hr PRN 3. Relative rest but keep shoulder mobile, demonstrated ROM exercises, avoid heavy lifting 4. May try heating pad PRN 5. Important to follow-up as scheduled or sooner with Spine Surgery Dr Linus Orn ortho 1-2 weeks 6. RTC 1 month re-evaluation, also return immediately if develop shingles rash

## 2015-11-18 NOTE — Progress Notes (Signed)
Subjective:    Patient ID: Danielle [name], female    DOB: 07-Jul-1956, 60 y.o.   MRN: [id number]  Danielle [name] is a 60 y.o. female presenting on 11/18/2015 for numb right arm; Dizziness; and Nausea  Patient presents for a same day appointment.  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 Last neck imaging in Epic MRI C-spine 01/11/2015 with multiple levels spinal stenosis C4 to C7 (see report for details), prior to C-spine Surgery Dr Louanne Skye 02-08-2015. - Reported that she received a steroid injection in Right shoulder by Dr Louanne Skye 2 weeks ago, she did well without symptoms of pain for past 2 weeks until overnight 0200 woke up with intense up to >10/10 pain for episodes up to 10-39min then will spontaneously improve then get worse again few minutes later - Denies any new injury fall or trauma. - Taking ASA 81mg  daily. Does have history of TIA. - No longer taking Hydrocone chronically was on for chronic back pain s/p multiple surgeries. Now only taking Ibuprofen 800mg  as needed. - Admits nausea, sweat due to increase pain - Denies any fevers/chills, sweating, rash, abdominal pain, SOB, CP, numbness, tingling, HA, weakness, bowel or bladder incontinence  Social History   Social History  . Marital Status: Widowed    Spouse Name: N/A  . Number of Children: 2  . Years of Education: 12   Occupational History  . Retired Cabin crew    Social History Main Topics  . Smoking status: Current Every Day Smoker -- 0.50 packs/day for 43 years    Types: Cigarettes  . Smokeless tobacco: Never Used  . Alcohol Use: No  . Drug Use: No  . Sexual Activity: Not Currently   Other Topics Concern  . Not on file   Social History Narrative   Lives at home by herself   Husband died in February 07, 2005 of MI   Has 2 daughters, 3  grandchildren   Exercises by walking 1 mile per day    Review of Systems Per HPI unless specifically indicated above     Objective:    BP 138/104 mmHg  Pulse 103  Temp(Src) 98.5 F (36.9 C) (Oral)  Wt 194 lb 9.6 oz (88.27 kg)  Wt Readings from Last 3 Encounters:  11/18/15 194 lb 9.6 oz (88.27 kg)  09/29/15 194 lb 6 oz (88.168 kg)  07/03/15 187 lb 3.2 oz (84.913 kg)    Physical Exam  Constitutional: She is oriented to person, place, and time. She appears well-developed and well-nourished. No distress.  Well-appearing but uncomfortable with Right shoulder/arm pain, cooperative  HENT:  Head: Normocephalic and atraumatic.  Eyes: EOM are normal.  Neck: Normal range of motion. Neck supple.  C-spine non-tender over spinous process or paraspinal muscles. Spurling's Manuever for radiculopathy was negative bilaterally.  Cardiovascular: Normal rate, regular rhythm, normal heart sounds and intact distal pulses.   No murmur heard. Musculoskeletal: She exhibits no edema.  Shoulder: Inspection: Normal symmetrical appearance. No obvious deformity. Palpation: Mild tenderness posterior Right shoulder above scapula. Left non-tender. Mild muscle spasm of Right trapezius muscle. ROM: full AROM shoulder ext / flex, above head, and internal rotation behind back. Special Testing: Rotator cuff muscle str testing intact, supraspinatus full can normal, empty can normal without weakness. Hawkins impingement testing negative on Right. Neurovascular: distally intact  Lymphadenopathy:    She has no cervical adenopathy.  Neurological: She is alert and oriented to person, place, and time. No cranial nerve deficit.  Gait normal. Muscle str 5/5 distal ext grip, biceps, and lower ext ankles. Distal sensation to light touch intact bilateral upper/lower extremities. Upper ext DTR +2 intact brachiorad, biceps, triceps  Skin: Skin is warm and dry. No rash (No evidence of rash on skin over Right shoulder.) noted.  She is not diaphoretic.  Nursing note and vitals reviewed.  Results for orders placed or performed in visit on 07/03/15  Hemoglobin  Result Value Ref Range   Hemoglobin 14.3 12.2 - 16.2 g/dL  Hemoccult - 1 Card (office)  Result Value Ref Range   Fecal Occult Blood, POC Negative Negative   Card #1 Date 07/03/2015       Assessment & Plan:   Problem List Items Addressed This Visit    Acute shoulder pain - Primary    Acute Right-shoulder pain (< 24 hour onset) without inciting injury/trauma, no clear etiology in setting of complex chronic OA/DJD including C-spine disease with spinal stenosis multiple back and neck surgeries (last done 1 year ago, Dr [facility]). With radiation down Right arm but no discrete neck pain. No clear radicular symptoms with paresthesias or weakness. Right shoulder rotator cuff appears intact without weakness, testing normal. Recently had R-shoulder steroid injection 2 weeks prior for similar but not as severe pain. - Differential dx: concern radiating pain from C-spinal stenosis with radiculopathy vs Right rotator cuff bursitis flare, also include potential herpes zoster shingles (no evidence of rash but acute onset, may develop later). H/o TIA but no neurological deficits, and not consistent with CVA/TIA. - Last imaging C-spine MRI 01/2015 reviewed report with multiple level stenosis C5-6, C6-7, C4-5  Plan: 1. Prednisone taper 12 days 20mg  tabs - 40mg  x 4 days, 20mg  x 4 days, 10mg  x 4 days 2. Continue Ibuprofen / May take Tylenol Ex Str 1-2 q 6 hr PRN 3. Relative rest but keep shoulder mobile, demonstrated ROM exercises, avoid heavy lifting 4. May try heating pad PRN 5. Important to follow-up as scheduled or sooner with Spine Surgery Dr [facility] 1-2 weeks 6. RTC 1 month re-evaluation, also return immediately if develop shingles rash       Relevant Medications   predniSONE (DELTASONE) 20 MG tablet   Cervical spondylosis without myelopathy  (Chronic)   Relevant Medications   aspirin EC 81 MG tablet   predniSONE (DELTASONE) 20 MG tablet   Spinal stenosis in cervical region   Relevant Medications   predniSONE (DELTASONE) 20 MG tablet    Other Visit Diagnoses    Nausea without vomiting        Due to pain, switched zofran to phenergan for ins coverage    Relevant Medications    promethazine (PHENERGAN) 25 MG tablet       Meds ordered this encounter  Medications  . aspirin EC 81 MG tablet    Sig: Take 81 mg by mouth.  . predniSONE (DELTASONE) 20 MG tablet    Sig: Take 2 tablets daily (40mg ) for 4 days, take 1 tab daily (20mg ) for 4 days, take half tab daily (10mg ) for 4 days    Dispense:  14 tablet    Refill:  0  . DISCONTD: ondansetron (ZOFRAN) 4 MG tablet    Sig: Take 1 tablet (4 mg total) by mouth every 8 (eight) hours as needed for nausea or vomiting.  Dispense:  20 tablet    Refill:  0  . promethazine (PHENERGAN) 25 MG tablet    Sig: Take 0.5-1 tablets (12.5-25 mg total) by mouth every 8 (eight) hours as needed for nausea or vomiting.    Dispense:  20 tablet    Refill:  0      Follow up plan: Return in about 4 weeks (around 12/16/2015) for Right shoulder / arm pain, possible cervical spine stenosis.  Nobie Putnam, [place], PGY-3

## 2015-12-31 ENCOUNTER — Ambulatory Visit (INDEPENDENT_AMBULATORY_CARE_PROVIDER_SITE_OTHER): Payer: Medicare Other | Admitting: Family Medicine

## 2015-12-31 ENCOUNTER — Encounter: Payer: Self-pay | Admitting: Family Medicine

## 2015-12-31 VITALS — BP 140/90 | HR 83 | Temp 97.8°F | Ht 65.0 in | Wt 195.9 lb

## 2015-12-31 DIAGNOSIS — E119 Type 2 diabetes mellitus without complications: Secondary | ICD-10-CM | POA: Diagnosis not present

## 2015-12-31 DIAGNOSIS — E7800 Pure hypercholesterolemia, unspecified: Secondary | ICD-10-CM

## 2015-12-31 DIAGNOSIS — Z114 Encounter for screening for human immunodeficiency virus [HIV]: Secondary | ICD-10-CM

## 2015-12-31 DIAGNOSIS — I10 Essential (primary) hypertension: Secondary | ICD-10-CM

## 2015-12-31 DIAGNOSIS — M8580 Other specified disorders of bone density and structure, unspecified site: Secondary | ICD-10-CM

## 2015-12-31 DIAGNOSIS — E039 Hypothyroidism, unspecified: Secondary | ICD-10-CM | POA: Diagnosis not present

## 2015-12-31 DIAGNOSIS — M899 Disorder of bone, unspecified: Secondary | ICD-10-CM

## 2015-12-31 DIAGNOSIS — R1013 Epigastric pain: Secondary | ICD-10-CM

## 2015-12-31 DIAGNOSIS — Z1159 Encounter for screening for other viral diseases: Secondary | ICD-10-CM | POA: Insufficient documentation

## 2015-12-31 DIAGNOSIS — R002 Palpitations: Secondary | ICD-10-CM

## 2015-12-31 LAB — COMPLETE METABOLIC PANEL WITH GFR
ALBUMIN: 4.2 g/dL (ref 3.6–5.1)
ALK PHOS: 88 U/L (ref 33–130)
ALT: 22 U/L (ref 6–29)
AST: 17 U/L (ref 10–35)
BUN: 10 mg/dL (ref 7–25)
CALCIUM: 10.7 mg/dL — AB (ref 8.6–10.4)
CHLORIDE: 103 mmol/L (ref 98–110)
CO2: 27 mmol/L (ref 20–31)
CREATININE: 0.51 mg/dL (ref 0.50–1.05)
GFR, Est African American: >89 mL/min (ref >=60)
GFR, Est Non African American: >89 mL/min (ref >=60)
Glucose, Bld: 109 mg/dL — ABNORMAL HIGH (ref 65–99)
Potassium: 4.9 mmol/L (ref 3.5–5.3)
Sodium: 139 mmol/L (ref 135–146)
TOTAL PROTEIN: 6.7 g/dL (ref 6.1–8.1)
Total Bilirubin: 0.4 mg/dL (ref 0.2–1.2)

## 2015-12-31 LAB — LIPID PANEL
CHOL/HDL RATIO: 4.9 ratio (ref <=5.0)
Cholesterol: 188 mg/dL (ref 125–200)
HDL: 38 mg/dL — ABNORMAL LOW (ref >=46)
LDL Cholesterol: 121 mg/dL (ref <130)
Triglycerides: 143 mg/dL (ref <150)
VLDL: 29 mg/dL (ref <30)

## 2015-12-31 LAB — TSH: TSH: 2.73 mIU/L

## 2015-12-31 LAB — POCT GLYCOSYLATED HEMOGLOBIN (HGB A1C): Hemoglobin A1C: 6.4

## 2015-12-31 LAB — HIV ANTIBODY (ROUTINE TESTING W REFLEX): HIV: NONREACTIVE

## 2015-12-31 NOTE — Assessment & Plan Note (Signed)
No work up for now unless abnormal labs.  She knows to contact me if worsening symptoms.

## 2015-12-31 NOTE — Assessment & Plan Note (Signed)
Good control.  No change.  Next med to add would be ACE due to [facility]

## 2015-12-31 NOTE — Assessment & Plan Note (Signed)
Check TSH 

## 2015-12-31 NOTE — Assessment & Plan Note (Signed)
No risk factors screen x 1

## 2015-12-31 NOTE — Assessment & Plan Note (Signed)
Definitely needs screen given husband's disease.

## 2015-12-31 NOTE — Assessment & Plan Note (Signed)
Intermitent and rare.  No syncope.  No further work up for now.

## 2015-12-31 NOTE — Progress Notes (Signed)
   Subjective:    Patient ID: Danielle [name], female    DOB: December 19, 1955, 60 y.o.   MRN: [id number]  HPI Follow up of several vague complaints and known medical problems. 1. Still with some abd discomfort.  No more rectal bleeding.  Did have a colonoscopy within the last 6 months but I can't find a record.  No weight loss.  No other red flag symptoms. 2. She feels she goes through periods where she is fine and then goes through a short period when she is having palpitations and her blood pressure is high.  She has been worked up for palpitations and no significant abnormality has been found. 3. Diet controled diabetic.  Needs A1C, eye exam and foot exam.   4. Never HIV screened. 5. Never hep C screened.  Husband died of cirrhosis due to hepatitis.  She has not had elevated transaminases. 6. Due for TSH to monitor hypothyroid replacement. 7. More than one year since last lipid panel 8. Hx of mild hypercalcemia, neg prior workup.  Gives interesting history that multiple family members have the same problem. 9. Known C spine disease.  Her chest shoulder pain from last visit has resolved.      Review of Systems     Objective:   Physical Exam HEENT normal, no proptosis Neck normal thyroid. Lungs clear Cardiac RRR without m or g Abd benign. Ext no edema.       Assessment & Plan:

## 2015-12-31 NOTE — Patient Instructions (Signed)
I will call with lab results. See me in 3-6 months. Please get a mammogram Also get an eye exam and have the doctor send me a copy of the results.

## 2016-01-01 ENCOUNTER — Encounter: Payer: Self-pay | Admitting: Family Medicine

## 2016-01-01 LAB — HEPATITIS C ANTIBODY: HCV AB: NEGATIVE

## 2016-01-01 LAB — VITAMIN D 25 HYDROXY (VIT D DEFICIENCY, FRACTURES): VIT D 25 HYDROXY: 8 ng/mL — AB (ref 30–100)

## 2016-01-01 MED ORDER — VITAMIN D 1000 UNITS PO TABS: 1000.0000 [IU] | ORAL_TABLET | Freq: Every day | ORAL | Status: DC

## 2016-01-01 NOTE — Addendum Note (Signed)
Addended by: [name] on: 01/01/2016 09:02 AM   Modules accepted: Orders

## 2016-01-05 ENCOUNTER — Other Ambulatory Visit: Payer: Self-pay

## 2016-01-05 DIAGNOSIS — Z1231 Encounter for screening mammogram for malignant neoplasm of breast: Secondary | ICD-10-CM

## 2016-01-12 DIAGNOSIS — M899 Disorder of bone, unspecified: Secondary | ICD-10-CM | POA: Insufficient documentation

## 2016-01-12 NOTE — Assessment & Plan Note (Signed)
Check vitamin d 

## 2016-01-20 ENCOUNTER — Ambulatory Visit
Admission: RE | Admit: 2016-01-20 | Discharge: 2016-01-20 | Disposition: A | Discharge status: Home / Self Care | Payer: Medicare Other | Source: Ambulatory Visit

## 2016-01-20 DIAGNOSIS — Z1231 Encounter for screening mammogram for malignant neoplasm of breast: Secondary | ICD-10-CM

## 2016-02-18 ENCOUNTER — Telehealth: Payer: Self-pay | Admitting: Family Medicine

## 2016-02-18 NOTE — Telephone Encounter (Signed)
Pt called and would like to speak to one of the nurses about some pain she is having. Please call patient at [phone]. jw

## 2016-02-22 NOTE — Telephone Encounter (Signed)
Return call to patient regarding pain.  Apologized to patient, that nurse of out of the office Thursday afternoon 4/13 and clinic was closed on Friday 4/14.  Patient stated she still did not feel so well.  Offered patient an appointment to be seen today.  Patient declined stating she prefer to only to see Dr. Andria Frames.  She has an appointment with Dr. Andria Frames on Wednesday 02/24/16.  Advised patient if she did not feel better or symptoms worse to come into clinic asap or go to nearest urgent care.  Patient stated she would be fine and wait until her appointment on Wednesday.  Will forward to PCP.  Derl Barrow, RN

## 2016-02-22 NOTE — Telephone Encounter (Signed)
Patient placed on provider schedule at 9 AM.  Derl Barrow, RN

## 2016-02-22 NOTE — Telephone Encounter (Signed)
Called and discussed.  She has midline epigastric pain that last for 5 minutes and radiates to jaw and right arm.  No pattern with exercise or food intake.  Did have nausea and diaphoresis on one occasion. She also took her BP one time and it was "sky high." She has enough pink flags for a cardiac source of pain that I will see her tomorrow.  She knows to go to the ER if persistent pain tonight.

## 2016-02-23 ENCOUNTER — Ambulatory Visit (INDEPENDENT_AMBULATORY_CARE_PROVIDER_SITE_OTHER): Payer: Medicare Other | Admitting: Cardiovascular Disease

## 2016-02-23 ENCOUNTER — Telehealth (HOSPITAL_COMMUNITY): Payer: Self-pay | Admitting: *Deleted

## 2016-02-23 ENCOUNTER — Ambulatory Visit (INDEPENDENT_AMBULATORY_CARE_PROVIDER_SITE_OTHER): Payer: Medicare Other | Admitting: Family Medicine

## 2016-02-23 ENCOUNTER — Encounter: Payer: Self-pay | Admitting: Family Medicine

## 2016-02-23 ENCOUNTER — Ambulatory Visit (HOSPITAL_COMMUNITY)
Admission: RE | Admit: 2016-02-23 | Discharge: 2016-02-23 | Disposition: A | Discharge status: Home / Self Care | Payer: Medicare Other | Source: Ambulatory Visit | Attending: Family Medicine | Admitting: Family Medicine

## 2016-02-23 ENCOUNTER — Ambulatory Visit
Admission: RE | Admit: 2016-02-23 | Discharge: 2016-02-23 | Disposition: A | Discharge status: Home / Self Care | Payer: Medicare Other | Source: Ambulatory Visit | Attending: Family Medicine | Admitting: Family Medicine

## 2016-02-23 ENCOUNTER — Encounter: Payer: Self-pay | Admitting: Cardiovascular Disease

## 2016-02-23 VITALS — BP 120/90 | HR 112 | Ht 65.0 in | Wt 191.2 lb

## 2016-02-23 VITALS — BP 130/81 | HR 94 | Temp 97.7°F | Ht 65.0 in | Wt 189.1 lb

## 2016-02-23 DIAGNOSIS — I10 Essential (primary) hypertension: Secondary | ICD-10-CM

## 2016-02-23 DIAGNOSIS — I4510 Unspecified right bundle-branch block: Secondary | ICD-10-CM | POA: Insufficient documentation

## 2016-02-23 DIAGNOSIS — R079 Chest pain, unspecified: Secondary | ICD-10-CM

## 2016-02-23 MED ORDER — ATORVASTATIN CALCIUM 40 MG PO TABS: 40.0000 mg | ORAL_TABLET | Freq: Every day | ORAL | Status: DC

## 2016-02-23 MED ORDER — METOPROLOL SUCCINATE ER 100 MG PO TB24: 200.0000 mg | ORAL_TABLET | Freq: Every day | ORAL | Status: DC

## 2016-02-23 MED ORDER — NITROGLYCERIN 0.4 MG SL SUBL: 0.4000 mg | SUBLINGUAL_TABLET | SUBLINGUAL | Status: AC | PRN

## 2016-02-23 MED ORDER — OMEPRAZOLE 40 MG PO CPDR: 40.0000 mg | DELAYED_RELEASE_CAPSULE | Freq: Every day | ORAL | Status: DC

## 2016-02-23 NOTE — Progress Notes (Signed)
   Subjective:    Patient ID: Danielle [name], female    DOB: [birth year], 60 y.o.   MRN: [id number]  HPI Brought in acutely today after phone message yesterday.  She has had one week of intermitent epigastric/lower substernal chest pain.  She describes the pain as "like being stabbed with a knife," lasts 5 minutes, radiated on one occasion to the right jaw and right arm, and is associated with nausea, diaphoresis, shortness of breath, tachycardia and high blood pressure.  No pattern to it, denies exertional or related to meals.  Risk factors, quit smoking one week ago.  Recent cholesterol was suboptimal and not on statin. HBP is well controled most of the time, but has a history of spiking at times.  Recent dx of diet controled DM based on A1C=6.4.  On ASA for primary prevention and metoprolol for HBP and non specific palpitations.  She has a history of palpitations, anxiety and spiking HBP.  She also has a history of cervical spine disease.  She only takes the ibuprofen 800 once a day and it does not seem to be related to her current pain.      Review of Systems     Objective:   Physical Exam VS and general appearance are fine by me. No thyromegally, Lungs clear Cardiac RRR without m or g Abd benign Ext no edema.  EKG is normal/reassuring.        Assessment & Plan:

## 2016-02-23 NOTE — Assessment & Plan Note (Addendum)
While I am reassured somewhat by a normal EKG, Danielle [name] has enough typical angina symptoms to make me worry about ACS.  My nurse is calling to see if she can be seen by cardiology promptly.    Diff Dx, Cervical spine disease, Peptic ulcer disease, Pulm, lingering cough. MSK Episodic hypertension makes me think of the zebra, pheochromocytoma.  Will check urinary catacholamines later Doubt hyperthyroid - recent TSH normal  In addition to the cards referral, 1. DC ibuprofen 2. Start PPI 3. CXR 4. Start statin 5. Bump metoprolol 6. Rx for sublinqual nitro.

## 2016-02-23 NOTE — Progress Notes (Signed)
Cardiology Office Note   Date:  02/23/2016   ID:  Danielle [name], DOB [birth year], MRN [id number]  PCP:  Zigmund Gottron, MD  Cardiologist:    Jenkins Rouge, MD  Chief Complaint  Patient presents with  . Chest Pain   Problem List 1. Chest pain - normal cath in 2013 2. Hypothyroidism  3. Hyperlipidemia 4. Smoker - quit March 2017 5.    History of Present Illness: Danielle [name] is a 60 y.o. female who presents for eval of chest pain as a work in visit as DOD Was seen by Dr. Andria Frames [facility])  Reports episodes of stabbing CP starting  A week ago. Would last only a few seconds.   Would go away and then reappear several hours later Associated with nausea and some sweats. CP are not brought on by exertion .  Some radiation to the right arm and right jaw.  No pleuretic CP   Has had some shortness of breath doing her normal yard work activities.  Has had a normal myoview and normal cath in 2013.   Dr. Andria Frames increased her Toprol XL  to 200 mg a day      Past Medical History  Diagnosis Date  . Hypertension   . Glucose intolerance (pre-diabetes)   . History of tobacco abuse   . History of hiatal hernia   . Tachycardia   . 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 Obesity   . Dyslipidemia   . Cervical spondylosis without myelopathy 09/12/2013  . Complication of anesthesia   . PONV (postoperative nausea and vomiting)   . Family history of anesthesia complication     Mother - N/V  . Oral thrush     after back surgeries  . Heart murmur     nothing to be concerned  . Hypothyroidism   . Kidney stone   . Gastroesophageal reflux disease     8./25/15- no problem  . History of blood transfusion   . Diverticulitis   . Stroke [facility])     TIA X 2-3; last in 2013  . Diabetes mellitus without complication ([place])     diet controlled -no meds    Past Surgical History  Procedure Laterality Date  . Abdominal hysterectomy    . Cholecystectomy    . Tonsillectomy    .  Appendectomy    . Cystoscopy w/ stone manipulation    . Cystoscopy w/ ureteral stent placement    . Back surgery      10 spine surgeries since 1988.  Marland Kitchen Posterior cervical fusion/foraminotomy N/A 07/01/2014    Procedure: RIGHT C5-6 and C6-7 FORAMINOTOMY;  Surgeon: Jessy Oto, MD;  Location: [place];  Service: Orthopedics;  Laterality: N/A;  . Carpal tunnel release Right 04/07/2015    Procedure: RIGHT CARPAL TUNNEL RELEASE;  Surgeon: Daryll Brod, MD;  Location: [place];  Service: Orthopedics;  Laterality: Right;  . Ulnar nerve transposition Right 04/07/2015    Procedure: DECOMPRESSION  ULNAR NERVE RIGHT ELBOW;  Surgeon: Daryll Brod, MD;  Location: [place];  Service: Orthopedics;  Laterality: Right;  . Carpal tunnel release Left 09/29/2015    Procedure: CARPAL TUNNEL RELEASE LEFT ;  Surgeon: Daryll Brod, MD;  Location: [place];  Service: Orthopedics;  Laterality: Left;  . Ulnar nerve transposition Left 09/29/2015    Procedure: DECOMPRESSION  ULNAR NERVE LEFT ELBOW;  Surgeon: Daryll Brod, MD;  Location: [place];  Service: Orthopedics;  Laterality: Left;     Current Outpatient Prescriptions  Medication Sig Dispense Refill  . aspirin EC 81 MG tablet Take 81 mg by mouth daily.     Marland Kitchen atorvastatin (LIPITOR) 40 MG tablet Take 1 tablet (40 mg total) by mouth daily. 90 tablet 3  . cholecalciferol (VITAMIN D) 1000 units tablet Take 1 tablet (1,000 Units total) by mouth daily. 100 tablet 3  . levothyroxine (SYNTHROID, LEVOTHROID) 200 MCG tablet TAKE 1 TABLET BY MOUTH EVERY DAY BEFORE BREAKFAST 90 tablet 3  . metoprolol succinate (TOPROL-XL) 100 MG 24 hr tablet Take 2 tablets (200 mg total) by mouth daily. Take with or immediately following a meal. 180 tablet 3  . nitroGLYCERIN (NITROSTAT) 0.4 MG SL tablet Place 1 tablet (0.4 mg total) under the tongue every 5 (five) minutes as needed for chest pain. 50 tablet 3  . omeprazole (PRILOSEC) 40  MG capsule Take 1 capsule (40 mg total) by mouth daily. 30 capsule 3   No current facility-administered medications for this visit.    Allergies:   Cephalexin; Codeine; and Ivp dye    Social History:  The patient  reports that she has quit smoking. Her smoking use included Cigarettes. She has a 21.5 pack-year smoking history. She has never used smokeless tobacco. She reports that she does not drink alcohol or use illicit drugs.   Family History:  The patient's family history includes Cancer in her father, mother, and other; Coronary artery disease in her other; Diabetes in her father, mother, and other; Heart attack in her brother and father; Heart failure in her other; Hypertension in her other; Stroke in her mother and other.    ROS:  Please see the history of present illness.    Review of Systems: Constitutional:  denies fever, chills, diaphoresis, appetite change and fatigue.  HEENT: denies photophobia, eye pain, redness, hearing loss, ear pain, congestion, sore throat, rhinorrhea, sneezing, neck pain, neck stiffness and tinnitus.  Respiratory: denies SOB, DOE, cough, chest tightness, and wheezing.  Cardiovascular: admits to chest pain, palpitations and leg swelling.  Gastrointestinal: denies nausea, vomiting, abdominal pain, diarrhea, constipation, blood in stool.  Genitourinary: denies dysuria, urgency, frequency, hematuria, flank pain and difficulty urinating.  Musculoskeletal: denies  myalgias, back pain, joint swelling, arthralgias and gait problem.   Skin: denies pallor, rash and wound.  Neurological: denies dizziness, seizures, syncope, weakness, light-headedness, numbness and headaches.   Hematological: denies adenopathy, easy bruising, personal or family bleeding history.  Psychiatric/ Behavioral: denies suicidal ideation, mood changes, confusion, nervousness, sleep disturbance and agitation.       All other systems are reviewed and negative.    PHYSICAL EXAM: VS:  BP  120/90 mmHg  Pulse 112  Ht 5\' 5"  (1.651 m)  Wt 191 lb 3.2 oz (86.728 kg)  BMI 31.82 kg/m2  SpO2 98% , BMI Body mass index is 31.82 kg/(m^2). GEN: Well nourished, well developed, in no acute distress HEENT: normal Neck: no JVD, carotid bruits, or masses Cardiac: RRR; no murmurs, rubs, or gallops,no edema  Respiratory:  clear to auscultation bilaterally, normal work of breathing GI: soft, nontender, nondistended, + BS MS: no deformity or atrophy Skin: warm and dry, no rash Neuro:  Strength and sensation are intact Psych: normal   EKG:  EKG is ordered today. The ekg ordered today demonstrates  NSR . No ST or T wave changes.    Recent Labs: 07/03/2015: Hemoglobin 14.3 12/31/2015: ALT 22; BUN 10; Creat 0.51; Potassium 4.9; Sodium 139; TSH  2.73    Lipid Panel    Component Value Date/Time   CHOL 188 12/31/2015 0914   TRIG 143 12/31/2015 0914   HDL 38* 12/31/2015 0914   CHOLHDL 4.9 12/31/2015 0914   VLDL 29 12/31/2015 0914   LDLCALC 121 12/31/2015 0914      Wt Readings from Last 3 Encounters:  02/23/16 191 lb 3.2 oz (86.728 kg)  02/23/16 189 lb 1.6 oz (85.775 kg)  12/31/15 195 lb 14.4 oz (88.86 kg)      Other studies Reviewed: Additional studies/ records that were reviewed today include: . Review of the above records demonstrates:    ASSESSMENT AND PLAN:  1.  Chest pain :  Very atypical .   Sounds like MSK issues or perhaps GERD. Lasts for only a second or so  Will get a Lexiscan myoview   2. DOE:   Encouraged her to exercise  3. Palpitations:   Sound like PVCs  Reassured her that these are benign.   Current medicines are reviewed at length with the patient today.  The patient does not have concerns regarding medicines.  The following changes have been made:  no change  Labs/ tests ordered today include:   Orders Placed This Encounter  Procedures  . Myocardial Perfusion Imaging     Disposition:   FU with Dr. Johnsie Cancel as needed.      Nahser, Wonda Cheng,  MD  02/23/2016 6:00 PM    [address], [place], [place]  [address] Phone: [phone]; Fax: [phone]   [facility]  [address] [place], [place]  [address] [phone]   Fax [phone]

## 2016-02-23 NOTE — Telephone Encounter (Signed)
Left message on voicemail in reference to upcoming appointment scheduled for 02/25/16  Phone number given for a call back so details instructions can be given. Hubbard Robinson, RN

## 2016-02-23 NOTE — Patient Instructions (Addendum)
My nurse is arranging a prompt referral back to cardiology.  While I really don't think your pain is cardiac, I am worried enough that I want it checked out thoroughly.   Also 1. Stop your ibuprofen 2. Start omeproazole for stomach acid 3. Get a chest x ray. 4. Start lipitor/atorvastatin. 5. Bump metoprolol to 2 pills daily 6. Rx for sublinqual nitro to take as needed for chest pain.  Go to ER if pain persists.

## 2016-02-23 NOTE — Patient Instructions (Signed)
Medication Instructions:  Your physician recommends that you continue on your current medications as directed. Please refer to the Current Medication list given to you today.   Labwork: None Ordered   Testing/Procedures: Your physician has requested that you have a lexiscan myoview. For further information please visit [email or web address]. Please follow instruction sheet, as given.   Follow-Up: Your physician recommends that you schedule a follow-up appointment in: as needed with Dr. Nahser.    If you need a refill on your cardiac medications before your next appointment, please call your pharmacy.   Thank you for choosing [facility]! Rafael Salway, RN [phone]   

## 2016-02-24 ENCOUNTER — Ambulatory Visit: Payer: Medicare Other | Admitting: Family Medicine

## 2016-02-24 ENCOUNTER — Telehealth: Payer: Self-pay | Admitting: Cardiovascular Disease

## 2016-02-24 NOTE — Telephone Encounter (Signed)
error 

## 2016-02-25 ENCOUNTER — Ambulatory Visit (HOSPITAL_COMMUNITY): Payer: Medicare Other | Attending: Cardiology

## 2016-02-25 DIAGNOSIS — R079 Chest pain, unspecified: Secondary | ICD-10-CM

## 2016-02-25 DIAGNOSIS — R0609 Other forms of dyspnea: Secondary | ICD-10-CM | POA: Diagnosis not present

## 2016-02-25 DIAGNOSIS — R110 Nausea: Secondary | ICD-10-CM

## 2016-02-25 DIAGNOSIS — E119 Type 2 diabetes mellitus without complications: Secondary | ICD-10-CM | POA: Diagnosis not present

## 2016-02-25 LAB — MYOCARDIAL PERFUSION IMAGING
CHL CUP NUCLEAR SDS: 0
CHL CUP NUCLEAR SSS: 0
CSEPPHR: 116 {beats}/min
LHR: 0.24
LVDIAVOL: 103 mL (ref 46–106)
LVSYSVOL: 47 mL
Rest HR: 80 {beats}/min
SRS: 0
TID: 1.21

## 2016-02-25 MED ORDER — AMINOPHYLLINE 25 MG/ML IV SOLN
150.0000 mg | Freq: Once | INTRAVENOUS | Status: AC
  Administered 2016-02-25: 150 mg via INTRAVENOUS

## 2016-02-25 MED ORDER — TECHNETIUM TC 99M SESTAMIBI GENERIC - CARDIOLITE
10.7000 | Freq: Once | INTRAVENOUS | Status: AC | PRN
  Administered 2016-02-25: 11 via INTRAVENOUS

## 2016-02-25 MED ORDER — TECHNETIUM TC 99M SESTAMIBI GENERIC - CARDIOLITE
31.6000 | Freq: Once | INTRAVENOUS | Status: AC | PRN
  Administered 2016-02-25: 32 via INTRAVENOUS

## 2016-02-25 MED ORDER — REGADENOSON 0.4 MG/5ML IV SOLN
0.4000 mg | Freq: Once | INTRAVENOUS | Status: AC
  Administered 2016-02-25: 0.4 mg via INTRAVENOUS

## 2016-06-13 ENCOUNTER — Telehealth: Payer: Self-pay | Admitting: *Deleted

## 2016-06-13 ENCOUNTER — Observation Stay (HOSPITAL_COMMUNITY): Payer: Medicare Other

## 2016-06-13 ENCOUNTER — Encounter (HOSPITAL_COMMUNITY): Payer: Self-pay | Admitting: Vascular Surgery

## 2016-06-13 ENCOUNTER — Observation Stay (HOSPITAL_COMMUNITY)
Admission: EM | Admit: 2016-06-13 | Discharge: 2016-06-14 | Disposition: A | Discharge status: Home / Self Care | Payer: Medicare Other | Attending: Family Medicine | Admitting: Family Medicine

## 2016-06-13 ENCOUNTER — Emergency Department (HOSPITAL_COMMUNITY): Payer: Medicare Other

## 2016-06-13 DIAGNOSIS — Z8673 Personal history of transient ischemic attack (TIA), and cerebral infarction without residual deficits: Secondary | ICD-10-CM | POA: Insufficient documentation

## 2016-06-13 DIAGNOSIS — Z6830 Body mass index (BMI) 30.0-30.9, adult: Secondary | ICD-10-CM | POA: Diagnosis not present

## 2016-06-13 DIAGNOSIS — G458 Other transient cerebral ischemic attacks and related syndromes: Secondary | ICD-10-CM

## 2016-06-13 DIAGNOSIS — Z79899 Other long term (current) drug therapy: Secondary | ICD-10-CM | POA: Insufficient documentation

## 2016-06-13 DIAGNOSIS — Z87891 Personal history of nicotine dependence: Secondary | ICD-10-CM | POA: Insufficient documentation

## 2016-06-13 DIAGNOSIS — R079 Chest pain, unspecified: Secondary | ICD-10-CM

## 2016-06-13 DIAGNOSIS — E785 Hyperlipidemia, unspecified: Secondary | ICD-10-CM | POA: Diagnosis not present

## 2016-06-13 DIAGNOSIS — I639 Cerebral infarction, unspecified: Secondary | ICD-10-CM

## 2016-06-13 DIAGNOSIS — E7800 Pure hypercholesterolemia, unspecified: Secondary | ICD-10-CM | POA: Diagnosis not present

## 2016-06-13 DIAGNOSIS — E039 Hypothyroidism, unspecified: Secondary | ICD-10-CM | POA: Insufficient documentation

## 2016-06-13 DIAGNOSIS — R471 Dysarthria and anarthria: Secondary | ICD-10-CM | POA: Insufficient documentation

## 2016-06-13 DIAGNOSIS — G459 Transient cerebral ischemic attack, unspecified: Secondary | ICD-10-CM | POA: Diagnosis not present

## 2016-06-13 DIAGNOSIS — E669 Obesity, unspecified: Secondary | ICD-10-CM | POA: Insufficient documentation

## 2016-06-13 DIAGNOSIS — E119 Type 2 diabetes mellitus without complications: Secondary | ICD-10-CM | POA: Diagnosis not present

## 2016-06-13 DIAGNOSIS — I6310 Cerebral infarction due to embolism of unspecified precerebral artery: Secondary | ICD-10-CM | POA: Diagnosis not present

## 2016-06-13 DIAGNOSIS — Z7982 Long term (current) use of aspirin: Secondary | ICD-10-CM | POA: Diagnosis not present

## 2016-06-13 DIAGNOSIS — I10 Essential (primary) hypertension: Secondary | ICD-10-CM | POA: Diagnosis not present

## 2016-06-13 DIAGNOSIS — R29810 Facial weakness: Principal | ICD-10-CM | POA: Insufficient documentation

## 2016-06-13 LAB — COMPREHENSIVE METABOLIC PANEL
ALT: 24 U/L (ref 14–54)
AST: 22 U/L (ref 15–41)
Albumin: 4.5 g/dL (ref 3.5–5.0)
Alkaline Phosphatase: 102 U/L (ref 38–126)
Anion gap: 7 (ref 5–15)
BILIRUBIN TOTAL: 0.5 mg/dL (ref 0.3–1.2)
BUN: 17 mg/dL (ref 6–20)
CHLORIDE: 104 mmol/L (ref 101–111)
CO2: 27 mmol/L (ref 22–32)
Calcium: 11.7 mg/dL — ABNORMAL HIGH (ref 8.9–10.3)
Creatinine, Ser: 0.79 mg/dL (ref 0.44–1.00)
Glucose, Bld: 114 mg/dL — ABNORMAL HIGH (ref 65–99)
POTASSIUM: 3.7 mmol/L (ref 3.5–5.1)
Sodium: 138 mmol/L (ref 135–145)
TOTAL PROTEIN: 7.6 g/dL (ref 6.5–8.1)

## 2016-06-13 LAB — I-STAT CHEM 8, ED
BUN: 20 mg/dL (ref 6–20)
CALCIUM ION: 1.37 mmol/L — AB (ref 1.12–1.23)
Chloride: 104 mmol/L (ref 101–111)
Creatinine, Ser: 0.8 mg/dL (ref 0.44–1.00)
GLUCOSE: 110 mg/dL — AB (ref 65–99)
HCT: 47 % — ABNORMAL HIGH (ref 36.0–46.0)
HEMOGLOBIN: 16 g/dL — AB (ref 12.0–15.0)
POTASSIUM: 3.7 mmol/L (ref 3.5–5.1)
Sodium: 141 mmol/L (ref 135–145)
TCO2: 26 mmol/L (ref 0–100)

## 2016-06-13 LAB — GLUCOSE, CAPILLARY: GLUCOSE-CAPILLARY: 150 mg/dL — AB (ref 65–99)

## 2016-06-13 LAB — PROTIME-INR
INR: 1
Prothrombin Time: 13.2 seconds (ref 11.4–15.2)

## 2016-06-13 LAB — DIFFERENTIAL
BASOS PCT: 1 %
Basophils Absolute: 0.1 10*3/uL (ref 0.0–0.1)
EOS ABS: 0.2 10*3/uL (ref 0.0–0.7)
EOS PCT: 1 %
LYMPHS ABS: 4.1 10*3/uL — AB (ref 0.7–4.0)
Lymphocytes Relative: 23 %
MONO ABS: 1.1 10*3/uL — AB (ref 0.1–1.0)
MONOS PCT: 6 %
Neutro Abs: 12.3 10*3/uL — ABNORMAL HIGH (ref 1.7–7.7)
Neutrophils Relative %: 69 %

## 2016-06-13 LAB — CBC
HEMATOCRIT: 47.4 % — AB (ref 36.0–46.0)
HEMOGLOBIN: 15.5 g/dL — AB (ref 12.0–15.0)
MCH: 30.1 pg (ref 26.0–34.0)
MCHC: 32.7 g/dL (ref 30.0–36.0)
MCV: 92 fL (ref 78.0–100.0)
Platelets: 330 10*3/uL (ref 150–400)
RBC: 5.15 MIL/uL — ABNORMAL HIGH (ref 3.87–5.11)
RDW: 13.1 % (ref 11.5–15.5)
WBC: 17.8 10*3/uL — ABNORMAL HIGH (ref 4.0–10.5)

## 2016-06-13 LAB — APTT: APTT: 30 s (ref 24–36)

## 2016-06-13 LAB — CBG MONITORING, ED: Glucose-Capillary: 133 mg/dL — ABNORMAL HIGH (ref 65–99)

## 2016-06-13 LAB — I-STAT TROPONIN, ED: TROPONIN I, POC: 0 ng/mL (ref 0.00–0.08)

## 2016-06-13 LAB — ETHANOL

## 2016-06-13 MED ORDER — ASPIRIN EC 81 MG PO TBEC
81.0000 mg | DELAYED_RELEASE_TABLET | Freq: Every day | ORAL | Status: DC
  Administered 2016-06-14: 81 mg via ORAL
  Filled 2016-06-13: qty 1

## 2016-06-13 MED ORDER — SODIUM CHLORIDE 0.9 % IV SOLN
INTRAVENOUS | Status: DC
  Administered 2016-06-13: 22:00:00 via INTRAVENOUS

## 2016-06-13 MED ORDER — CLOPIDOGREL BISULFATE 75 MG PO TABS
75.0000 mg | ORAL_TABLET | Freq: Every day | ORAL | Status: DC
  Administered 2016-06-13 – 2016-06-14 (×2): 75 mg via ORAL
  Filled 2016-06-13 (×2): qty 1

## 2016-06-13 MED ORDER — METOPROLOL SUCCINATE ER 100 MG PO TB24
200.0000 mg | ORAL_TABLET | Freq: Every day | ORAL | Status: DC
  Administered 2016-06-13 – 2016-06-14 (×2): 200 mg via ORAL
  Filled 2016-06-13 (×2): qty 2

## 2016-06-13 MED ORDER — ATORVASTATIN CALCIUM 40 MG PO TABS
40.0000 mg | ORAL_TABLET | Freq: Every day | ORAL | Status: DC
  Administered 2016-06-13: 40 mg via ORAL
  Filled 2016-06-13: qty 1

## 2016-06-13 MED ORDER — LEVOTHYROXINE SODIUM 100 MCG PO TABS
200.0000 ug | ORAL_TABLET | Freq: Every day | ORAL | Status: DC
  Administered 2016-06-14: 200 ug via ORAL
  Filled 2016-06-13: qty 2

## 2016-06-13 MED ORDER — VITAMIN D 1000 UNITS PO TABS: 1000.0000 [IU] | ORAL_TABLET | Freq: Every day | ORAL | Status: DC

## 2016-06-13 MED ORDER — INSULIN ASPART 100 UNIT/ML ~~LOC~~ SOLN
0.0000 [IU] | Freq: Three times a day (TID) | SUBCUTANEOUS | Status: DC
  Administered 2016-06-14: 1 [IU] via SUBCUTANEOUS

## 2016-06-13 MED ORDER — SENNOSIDES-DOCUSATE SODIUM 8.6-50 MG PO TABS: 1.0000 | ORAL_TABLET | Freq: Every evening | ORAL | Status: DC | PRN

## 2016-06-13 MED ORDER — STROKE: EARLY STAGES OF RECOVERY BOOK
Freq: Once | Status: AC
  Administered 2016-06-13: 22:00:00

## 2016-06-13 MED ORDER — PANTOPRAZOLE SODIUM 40 MG PO TBEC
40.0000 mg | DELAYED_RELEASE_TABLET | Freq: Every day | ORAL | Status: DC
  Administered 2016-06-13 – 2016-06-14 (×2): 40 mg via ORAL
  Filled 2016-06-13 (×2): qty 1

## 2016-06-13 NOTE — ED Notes (Signed)
Pt states that she had blurred vision "just for a moment, I was mowing and I saw two mowers but then it went away". Pt denies blurred vision at this time.

## 2016-06-13 NOTE — ED Provider Notes (Signed)
[place] DEPT Provider Note   CSN: [id number] Arrival date & time: 06/13/16  1505  First Provider Contact:  First MD Initiated Contact with Patient 06/13/16 1534        History   Chief Complaint Chief Complaint  Patient presents with  . Stroke Symptoms    HPI Danielle [name] is a 60 y.o. female presenting with concern for a possible stroke. She was working in the yard when she noticed left arm pain in her shoulder down to her mid upper arm. She took a break and then noticed that her left face was drooping/drawing and she had trouble getting words out. EMS was called and she was feeling a little bit better and went to her doctor who told her to come straight here. Patient now feels back to baseline. She states that she has had for many strokes, most recently 2 years ago per her. She does endorse a slight right occipital headache. No nausea or vomiting. No weakness or numbness. The arm pain is gone. There was no chest pain or shortness of breath. She is currently on aspirin and Plavix. No obvious etiology for her prior TIAs.  HPI  Past Medical History:  Diagnosis Date  . 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 Cervical spondylosis without myelopathy 09/12/2013  . Complication of anesthesia   . Diabetes mellitus without complication (HCC)    diet controlled -no meds  . Diverticulitis   . Dyslipidemia   . Family history of anesthesia complication    Mother - N/V  . Gastroesophageal reflux disease    8./25/15- no problem  . Glucose intolerance (pre-diabetes)   . Heart murmur    nothing to be concerned  . History of blood transfusion   . History of hiatal hernia   . History of tobacco abuse   . Hypertension   . Hypothyroidism   . Kidney stone   . Obesity   . Oral thrush    after back surgeries  . PONV (postoperative nausea and vomiting)   . Stroke [facility])    TIA X 2-3; last in 2013  . Tachycardia     Patient Active Problem List   Diagnosis Date Noted  . Chest pain  02/23/2016  . Disorder of bone 01/12/2016  . Abdominal pain, epigastric 12/10/2014  . Spinal stenosis in cervical region 07/01/2014  . Osteopenia 01/22/2014  . Pain in joint, ankle and foot 12/27/2013  . Diverticulitis large intestine w/o perforation or abscess w/o bleeding 07/25/2013  . TIA (transient ischemic attack) 04/20/2011  . DEPRESSION 03/12/2010  . OBESITY 10/08/2009  . CYST AND PSEUDOCYST OF PANCREAS 09/21/2009  . HYPERCHOLESTEROLEMIA 12/10/2008  . ROTATOR CUFF SYNDROME 12/10/2008  . COLONIC POLYPS, HYPERPLASTIC 05/02/2008  . ESSENTIAL HYPERTENSION, BENIGN 09/17/2007  . Palpitations 09/17/2007  . Diabetes mellitus type 2, controlled, without complications ([place]) [id number]  . Hypothyroidism 01/04/2007  . HYPERCALCEMIA 01/04/2007  . Quit smoking 01/04/2007  . CARPAL TUNNEL SYNDROME 01/04/2007  . GASTROESOPHAGEAL REFLUX, NO ESOPHAGITIS 01/04/2007  . CERVICAL SPINE DISORDER, NOS 01/04/2007  . BACK PAIN W/RADIATION, UNSPECIFIED 01/04/2007    Past Surgical History:  Procedure Laterality Date  . ABDOMINAL HYSTERECTOMY    . APPENDECTOMY    . BACK SURGERY     10 spine surgeries since 1988.  Marland Kitchen CARPAL TUNNEL RELEASE Right 04/07/2015   Procedure: RIGHT CARPAL TUNNEL RELEASE;  Surgeon: Daryll Brod, MD;  Location: [place];  Service: Orthopedics;  Laterality: Right;  . CARPAL TUNNEL RELEASE Left  09/29/2015   Procedure: CARPAL TUNNEL RELEASE LEFT ;  Surgeon: Daryll Brod, MD;  Location: [place];  Service: Orthopedics;  Laterality: Left;  . CHOLECYSTECTOMY    . CYSTOSCOPY W/ STONE MANIPULATION    . CYSTOSCOPY W/ URETERAL STENT PLACEMENT    . POSTERIOR CERVICAL FUSION/FORAMINOTOMY N/A 07/01/2014   Procedure: RIGHT C5-6 and C6-7 FORAMINOTOMY;  Surgeon: Jessy Oto, MD;  Location: [place];  Service: Orthopedics;  Laterality: N/A;  . TONSILLECTOMY    . ULNAR NERVE TRANSPOSITION Right 04/07/2015   Procedure: DECOMPRESSION  ULNAR NERVE RIGHT ELBOW;   Surgeon: Daryll Brod, MD;  Location: [place];  Service: Orthopedics;  Laterality: Right;  . ULNAR NERVE TRANSPOSITION Left 09/29/2015   Procedure: DECOMPRESSION  ULNAR NERVE LEFT ELBOW;  Surgeon: Daryll Brod, MD;  Location: [place];  Service: Orthopedics;  Laterality: Left;    OB History    No data available       Home Medications    Prior to Admission medications   Medication Sig Start Date End Date Taking? Authorizing Provider  aspirin EC 81 MG tablet Take 81 mg by mouth daily.     Historical Provider, MD  atorvastatin (LIPITOR) 40 MG tablet Take 1 tablet (40 mg total) by mouth daily. 02/23/16   [name], MD  cholecalciferol (VITAMIN D) 1000 units tablet Take 1 tablet (1,000 Units total) by mouth daily. 01/01/16   [name], MD  levothyroxine (SYNTHROID, LEVOTHROID) 200 MCG tablet TAKE 1 TABLET BY MOUTH EVERY DAY BEFORE BREAKFAST 09/14/15   [name], MD  metoprolol succinate (TOPROL-XL) 100 MG 24 hr tablet Take 2 tablets (200 mg total) by mouth daily. Take with or immediately following a meal. 02/23/16   [name], MD  nitroGLYCERIN (NITROSTAT) 0.4 MG SL tablet Place 1 tablet (0.4 mg total) under the tongue every 5 (five) minutes as needed for chest pain. 02/23/16   [name], MD  omeprazole (PRILOSEC) 40 MG capsule Take 1 capsule (40 mg total) by mouth daily. 02/23/16   [name], MD    Family History Family History  Problem Relation Age of Onset  . Heart attack Father   . Cancer Father   . Diabetes Father   . Stroke Mother   . Diabetes Mother   . Cancer Mother   . Heart attack Brother   . Cancer Other   . Coronary artery disease Other   . Stroke Other   . Diabetes Other   . Hypertension Other   . Heart failure Other     Social History Social History  Substance Use Topics  . Smoking status: Former Smoker    Packs/day: 0.50    Years: 43.00    Types: Cigarettes  . Smokeless tobacco: Never Used      Comment: quit early 02/2016  . Alcohol use No     Allergies   Cephalexin; Codeine; and Ivp dye [iodinated diagnostic agents]   Review of Systems Review of Systems  Constitutional: Negative for fever.  Respiratory: Negative for shortness of breath.   Cardiovascular: Negative for chest pain.  Gastrointestinal: Negative for abdominal pain, nausea and vomiting.  Neurological: Positive for speech difficulty and headaches. Negative for weakness and numbness.  All other systems reviewed and are negative.    Physical Exam Updated Vital Signs BP (!) 169/106 (BP Location: Right Arm)   Pulse 114   Temp 98 F (36.7 C) (Oral)   Resp 16  SpO2 96%   Physical Exam  Constitutional: She is oriented to person, place, and time. She appears well-developed and well-nourished.  HENT:  Head: Normocephalic and atraumatic.  Right Ear: External ear normal.  Left Ear: External ear normal.  Nose: Nose normal.  Eyes: EOM are normal. Pupils are equal, round, and reactive to light. Right eye exhibits no discharge. Left eye exhibits no discharge.  Cardiovascular: Normal rate, regular rhythm and normal heart sounds.   Pulses:      Radial pulses are 2+ on the right side, and 2+ on the left side.       Dorsalis pedis pulses are 2+ on the right side, and 2+ on the left side.  Pulmonary/Chest: Effort normal and breath sounds normal.  Abdominal: Soft. There is no tenderness.  Neurological: She is alert and oriented to person, place, and time.  CN 3-12 grossly intact save for a very slight left facial droop. 5/5 strength in all 4 extremities. Grossly normal sensation.  Skin: Skin is warm and dry.  Nursing note and vitals reviewed.    ED Treatments / Results  Labs (all labs ordered are listed, but only abnormal results are displayed) Labs Reviewed  CBC - Abnormal; Notable for the following:       Result Value   WBC 17.8 (*)    RBC 5.15 (*)    Hemoglobin 15.5 (*)    HCT 47.4 (*)    All other  components within normal limits  DIFFERENTIAL - Abnormal; Notable for the following:    Neutro Abs 12.3 (*)    Lymphs Abs 4.1 (*)    Monocytes Absolute 1.1 (*)    All other components within normal limits  I-STAT CHEM 8, ED - Abnormal; Notable for the following:    Glucose, Bld 110 (*)    Calcium, Ion 1.37 (*)    Hemoglobin 16.0 (*)    HCT 47.0 (*)    All other components within normal limits  CBG MONITORING, ED - Abnormal; Notable for the following:    Glucose-Capillary 133 (*)    All other components within normal limits  PROTIME-INR  APTT  ETHANOL  COMPREHENSIVE METABOLIC PANEL  URINE RAPID DRUG SCREEN, HOSP PERFORMED  URINALYSIS, ROUTINE W REFLEX MICROSCOPIC (NOT AT [facility])  I-STAT TROPOININ, ED    EKG  EKG Interpretation  Date/Time:  Monday June 13 2016 15:18:59 EDT Ventricular Rate:  114 PR Interval:  146 QRS Duration: 78 QT Interval:  336 QTC Calculation: 463 R Axis:   64 Text Interpretation:  Sinus tachycardia Septal infarct , age undetermined Abnormal ECG no significant change since April 2017 Confirmed by Regenia Skeeter MD, Ericka Marcellus [phone]) on 06/13/2016 3:34:23 PM       Radiology Dg Chest 2 View  Result Date: 06/13/2016 CLINICAL DATA:  TIA. EXAM: CHEST  2 VIEW COMPARISON:  02/23/2016 FINDINGS: The cardiac silhouette, mediastinal and hilar contours are within normal limits and stable. The lungs are clear. No pleural effusion. The bony thorax is intact. Lumbar fusion hardware noted. IMPRESSION: No acute cardiopulmonary findings. Electronically Signed   By: Marijo Sanes M.D.   On: 06/13/2016 18:59   Ct Head Code Stroke Wo Contrast  Result Date: 06/13/2016 CLINICAL DATA:  Code stroke. Code stroke. Slurred speech and facial droop EXAM: CT HEAD WITHOUT CONTRAST TECHNIQUE: Contiguous axial images were obtained from the base of the skull through the vertex without intravenous contrast. COMPARISON:  MRI head 12/20/2011 FINDINGS: Ventricle size normal.  Cerebral volume normal for  age Negative for  acute infarct. Negative for intracranial hemorrhage. No mass or edema or midline shift. Regional soft tissues intact.  Normal orbit. Negative calvarium. ASPECTS [facility] Stroke Program Early CT Score, http://www.aspectsinstroke.com) - Ganglionic level infarction (caudate, lentiform nuclei, internal capsule, insula, M1-M3 cortex): 7 - Supraganglionic infarction (M4-M6 cortex): 3 Total score (0-10 with 10 being normal): 10 IMPRESSION: 1. No acute intracranial abnormality. 2. ASPECTS score 10 These results were called by telephone at the time of interpretation on 06/13/2016 at 3:42 pm to Dr. Tasia Catchings, who verbally acknowledged these results. Electronically Signed   By: Franchot Gallo M.D.   On: 06/13/2016 15:42    Procedures Procedures (including critical care time)  Medications Ordered in ED Medications - No data to display   Initial Impression / Assessment and Plan / ED Course  I have reviewed the triage vital signs and the nursing notes.  Pertinent labs & imaging results that were available during my care of the patient were reviewed by me and considered in my medical decision making (see chart for details).  Clinical Course  Comment By Time  Symptoms appear to be resolved. Patient now feels like at baseline. Maybe a small facial droop but it's subtle. Will wait for neuro recs. Sherwood Gambler, MD 08/07 1540  CT unremarkable. Labs benign except for elevated WBC, no obvious source or infectious symptoms. Neuro recommends TIA w/u. Patient has never had symptoms on left side before. Will consult hospitalist. Sherwood Gambler, MD 08/07 1615    Patient is a family practice patient, will admit to their service for TIA work up.  Final Clinical Impressions(s) / ED Diagnoses   Final diagnoses:  TIA (transient ischemic attack)  TIA (transient ischemic attack)    New Prescriptions New Prescriptions   No medications on file     Sherwood Gambler, MD 06/14/16 0006

## 2016-06-13 NOTE — ED Triage Notes (Signed)
Pt reports to the ED for eval of stroke symptoms. She reports she was out in the yard pulling weeds. She developed some left arm pain and she went and rested for a few minutes and felt better so she started pulling weeds again. She then developed some left sided facial droop and slurred speech. LSN 14:00 today. Slurred speech and droop noted. Pt denies any vision changes, unilateral weakness, numbness, or tingling. Pt A&Ox4, resp e/u, and skin warm and dry. Dr. Tamera Punt evaluated pt in triage. Code stroke called.

## 2016-06-13 NOTE — ED Notes (Signed)
Per Janett Billow with rapid response pt will be a TIA alert.

## 2016-06-13 NOTE — ED Notes (Signed)
Dr. Goldston at bedside.  

## 2016-06-13 NOTE — Consult Note (Signed)
Requesting Physician: Dr. Regenia Skeeter    Chief Complaint:  Left facial numbness and dysarthria  History obtained from:  Patient    HPI:                                                                                                                                         Danielle [name] is an 60 y.o. female who states she was outside pulling weeds and then noted her left arm was painful. This resolved but was followed by a sensation of left facial numbness and fullness along with slurred words. She was brought to [place] as a code stroke. While in the hospital her symptoms fully resolved. Currently she is asymptomatic.   Date last known well: Today Time last known well: Time: 14:00 tPA Given: No: symptoms resolved.   Past Medical History:  Diagnosis Date  . 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 Cervical spondylosis without myelopathy 09/12/2013  . Complication of anesthesia   . Diabetes mellitus without complication (HCC)    diet controlled -no meds  . Diverticulitis   . Dyslipidemia   . Family history of anesthesia complication    Mother - N/V  . Gastroesophageal reflux disease    8./25/15- no problem  . Glucose intolerance (pre-diabetes)   . Heart murmur    nothing to be concerned  . History of blood transfusion   . History of hiatal hernia   . History of tobacco abuse   . Hypertension   . Hypothyroidism   . Kidney stone   . Obesity   . Oral thrush    after back surgeries  . PONV (postoperative nausea and vomiting)   . Stroke [facility])    TIA X 2-3; last in 2013  . Tachycardia     Past Surgical History:  Procedure Laterality Date  . ABDOMINAL HYSTERECTOMY    . APPENDECTOMY    . BACK SURGERY     10 spine surgeries since 1988.  Marland Kitchen CARPAL TUNNEL RELEASE Right 04/07/2015   Procedure: RIGHT CARPAL TUNNEL RELEASE;  Surgeon: Daryll Brod, MD;  Location: [place];  Service: Orthopedics;  Laterality: Right;  . CARPAL TUNNEL RELEASE Left 09/29/2015   Procedure:  CARPAL TUNNEL RELEASE LEFT ;  Surgeon: Daryll Brod, MD;  Location: [place];  Service: Orthopedics;  Laterality: Left;  . CHOLECYSTECTOMY    . CYSTOSCOPY W/ STONE MANIPULATION    . CYSTOSCOPY W/ URETERAL STENT PLACEMENT    . POSTERIOR CERVICAL FUSION/FORAMINOTOMY N/A 07/01/2014   Procedure: RIGHT C5-6 and C6-7 FORAMINOTOMY;  Surgeon: Jessy Oto, MD;  Location: [place];  Service: Orthopedics;  Laterality: N/A;  . TONSILLECTOMY    . ULNAR NERVE TRANSPOSITION Right 04/07/2015   Procedure: DECOMPRESSION  ULNAR NERVE RIGHT ELBOW;  Surgeon: Daryll Brod, MD;  Location: [place];  Service: Orthopedics;  Laterality: Right;  . ULNAR  NERVE TRANSPOSITION Left 09/29/2015   Procedure: DECOMPRESSION  ULNAR NERVE LEFT ELBOW;  Surgeon: Daryll Brod, MD;  Location: [place];  Service: Orthopedics;  Laterality: Left;    Family History  Problem Relation Age of Onset  . Heart attack Father   . Cancer Father   . Diabetes Father   . Stroke Mother   . Diabetes Mother   . Cancer Mother   . Heart attack Brother   . Cancer Other   . Coronary artery disease Other   . Stroke Other   . Diabetes Other   . Hypertension Other   . Heart failure Other    Social History:  reports that she has quit smoking. Her smoking use included Cigarettes. She has a 21.50 pack-year smoking history. She has never used smokeless tobacco. She reports that she does not drink alcohol or use drugs.  Allergies:  Allergies  Allergen Reactions  . Cephalexin Other (See Comments)    burns stomach  . Codeine Hives and Nausea And Vomiting  . Ivp Dye [Iodinated Diagnostic Agents]     States had throat swelling, "couldn't breathe", states anaphylaxis. Also states has been given IVP dye since reaction without difficulty.     Medications:                                                                                                                           No current facility-administered  medications for this encounter.    Current Outpatient Prescriptions  Medication Sig Dispense Refill  . aspirin EC 81 MG tablet Take 81 mg by mouth daily.     Marland Kitchen atorvastatin (LIPITOR) 40 MG tablet Take 1 tablet (40 mg total) by mouth daily. 90 tablet 3  . cholecalciferol (VITAMIN D) 1000 units tablet Take 1 tablet (1,000 Units total) by mouth daily. 100 tablet 3  . levothyroxine (SYNTHROID, LEVOTHROID) 200 MCG tablet TAKE 1 TABLET BY MOUTH EVERY DAY BEFORE BREAKFAST 90 tablet 3  . metoprolol succinate (TOPROL-XL) 100 MG 24 hr tablet Take 2 tablets (200 mg total) by mouth daily. Take with or immediately following a meal. 180 tablet 3  . nitroGLYCERIN (NITROSTAT) 0.4 MG SL tablet Place 1 tablet (0.4 mg total) under the tongue every 5 (five) minutes as needed for chest pain. 50 tablet 3  . omeprazole (PRILOSEC) 40 MG capsule Take 1 capsule (40 mg total) by mouth daily. 30 capsule 3     ROS:  History obtained from the patient  General ROS: negative for - chills, fatigue, fever, night sweats, weight gain or weight loss Psychological ROS: negative for - behavioral disorder, hallucinations, memory difficulties, mood swings or suicidal ideation Ophthalmic ROS: negative for - blurry vision, double vision, eye pain or loss of vision ENT ROS: negative for - epistaxis, nasal discharge, oral lesions, sore throat, tinnitus or vertigo Allergy and Immunology ROS: negative for - hives or itchy/watery eyes Hematological and Lymphatic ROS: negative for - bleeding problems, bruising or swollen lymph nodes Endocrine ROS: negative for - galactorrhea, hair pattern changes, polydipsia/polyuria or temperature intolerance Respiratory ROS: negative for - cough, hemoptysis, shortness of breath or wheezing Cardiovascular ROS: negative for - chest pain, dyspnea on exertion, edema or  irregular heartbeat Gastrointestinal ROS: negative for - abdominal pain, diarrhea, hematemesis, nausea/vomiting or stool incontinence Genito-Urinary ROS: negative for - dysuria, hematuria, incontinence or urinary frequency/urgency Musculoskeletal ROS: negative for - joint swelling or muscular weakness Neurological ROS: as noted in HPI Dermatological ROS: negative for rash and skin lesion changes  Neurologic Examination:                                                                                                      Blood pressure 129/91, pulse 99, temperature 98 F (36.7 C), temperature source Oral, resp. rate 15, height 5\' 5"  (1.651 m), weight 83 kg (183 lb), SpO2 97 %.  HEENT-  Normocephalic, no lesions, without obvious abnormality.  Normal external eye and conjunctiva.  Normal TM's bilaterally.  Normal auditory canals and external ears. Normal external nose, mucus membranes and septum.  Normal pharynx. Cardiovascular- S1, S2 normal, pulses palpable throughout   Lungs- chest clear, no wheezing, rales, normal symmetric air entry Abdomen- normal findings: bowel sounds normal Extremities- no edema Lymph-no adenopathy palpable Musculoskeletal-no joint tenderness, deformity or swelling Skin-warm and dry, no hyperpigmentation, vitiligo, or suspicious lesions  Neurological Examination Mental Status: Alert, oriented, thought content appropriate.  Speech fluent without evidence of aphasia.  Able to follow 3 step commands without difficulty. Cranial Nerves: II: Visual fields grossly normal, pupils equal, round, reactive to light and accommodation III,IV, VI: ptosis not present, extra-ocular motions intact bilaterally V,VII: smile symmetric, facial light touch sensation normal bilaterally VIII: hearing normal bilaterally IX,X: uvula rises symmetrically XI: bilateral shoulder shrug XII: midline tongue extension Motor: Right : Upper extremity   5/5    Left:     Upper extremity   5/5  Lower  extremity   5/5     Lower extremity   5/5 Tone and bulk:normal tone throughout; no atrophy noted Sensory: Pinprick and light touch intact throughout, bilaterally Deep Tendon Reflexes: 2+ and symmetric throughout UE with no KJ or AJ Plantars: Right: downgoing   Left: downgoing Cerebellar: normal finger-to-nose,  and normal heel-to-shin test Gait: not tested       Lab Results: Basic Metabolic Panel:  Recent Labs Lab 06/13/16 1515 06/13/16 1528  NA 138 141  K 3.7 3.7  CL 104 104  CO2 27  --   GLUCOSE 114* 110*  BUN 17 20  CREATININE 0.79 0.80  CALCIUM 11.7*  --     Liver Function Tests:  Recent Labs Lab 06/13/16 1515  AST 22  ALT 24  ALKPHOS 102  BILITOT 0.5  PROT 7.6  ALBUMIN 4.5   No results for input(s): LIPASE, AMYLASE in the last 168 hours. No results for input(s): AMMONIA in the last 168 hours.  CBC:  Recent Labs Lab 06/13/16 1515 06/13/16 1528  WBC 17.8*  --   NEUTROABS 12.3*  --   HGB 15.5* 16.0*  HCT 47.4* 47.0*  MCV 92.0  --   PLT 330  --     Cardiac Enzymes: No results for input(s): CKTOTAL, CKMB, CKMBINDEX, TROPONINI in the last 168 hours.  Lipid Panel: No results for input(s): CHOL, TRIG, HDL, CHOLHDL, VLDL, LDLCALC in the last 168 hours.  CBG:  Recent Labs Lab 06/13/16 1535  GLUCAP 133*    Microbiology: Results for orders placed or performed in visit on 02/20/15  Urine culture     Status: None   Collection Time: 02/20/15 10:43 AM  Result Value Ref Range Status   Culture ESCHERICHIA COLI  Final   Colony Count 50,000 COLONIES/ML  Final   Organism ID, Bacteria ESCHERICHIA COLI  Final      Susceptibility   Escherichia coli -  (no method available)    AMPICILLIN  Resistant     AMOX/CLAVULANIC 4 Sensitive     AMPICILLIN/SULBACTAM 4 Sensitive     PIP/TAZO 8 Sensitive     IMIPENEM <=0.25 Sensitive     CEFAZOLIN <=4 Sensitive     CEFTRIAXONE <=1 Sensitive     CEFTAZIDIME <=1 Sensitive     CEFEPIME <=1 Sensitive      GENTAMICIN <=1 Sensitive     TOBRAMYCIN <=1 Sensitive     CIPROFLOXACIN <=0.25 Sensitive     LEVOFLOXACIN <=0.12 Sensitive     NITROFURANTOIN <=16 Sensitive     TRIMETH/SULFA <=20 Sensitive     Coagulation Studies:  Recent Labs  06/13/16 1515  LABPROT 13.2  INR 1.00    Imaging: Ct Head Code Stroke Wo Contrast  Result Date: 06/13/2016 CLINICAL DATA:  Code stroke. Code stroke. Slurred speech and facial droop EXAM: CT HEAD WITHOUT CONTRAST TECHNIQUE: Contiguous axial images were obtained from the base of the skull through the vertex without intravenous contrast. COMPARISON:  MRI head 12/20/2011 FINDINGS: Ventricle size normal.  Cerebral volume normal for age Negative for acute infarct. Negative for intracranial hemorrhage. No mass or edema or midline shift. Regional soft tissues intact.  Normal orbit. Negative calvarium. ASPECTS [facility] Stroke Program Early CT Score, http://www.aspectsinstroke.com) - Ganglionic level infarction (caudate, lentiform nuclei, internal capsule, insula, M1-M3 cortex): 7 - Supraganglionic infarction (M4-M6 cortex): 3 Total score (0-10 with 10 being normal): 10 IMPRESSION: 1. No acute intracranial abnormality. 2. ASPECTS score 10 These results were called by telephone at the time of interpretation on 06/13/2016 at 3:42 pm to Dr. Tasia Catchings, who verbally acknowledged these results. Electronically Signed   By: Franchot Gallo M.D.   On: 06/13/2016 15:42       Assessment and plan discussed with with attending physician and they are in agreement.    Etta Quill PA-C [facility] [phone]  06/13/2016, 3:50 PM    Neurology attending:  Assessment: 61 y.o. female with transient left facial numbness and dysarthria. Symptoms have fully cleared at this time. She iwll remain in the TIA window. No tPA indicated due to the resolution of her symptoms.   Stroke Risk Factors - hyperlipidemia,  hypertension and smoking   Recommend   1. MRI, MRA  of the brain  without contrast. If normal Glodine can be discharged to home given that her symptoms have resolved.  2. In the event the MRI reveals ischemic change further workup for stroke will be indicated.    Elson Clan M.D.  Neurohospitalist  [phone]

## 2016-06-13 NOTE — ED Notes (Addendum)
Pt has had her CT scan, she had a zero on her NIH, she passed her swallow screen, admitting has seen her, next neuro check is due at 1800 (6pm), pt is alert and oriented X 4, pt is ambulatory. Pt needs urine collected. Vital signs are stable at this time.

## 2016-06-13 NOTE — ED Notes (Signed)
Dr. Hensel at bedside 

## 2016-06-13 NOTE — Telephone Encounter (Signed)
Agree, sx worrisome for TIA.

## 2016-06-13 NOTE — Progress Notes (Signed)
Pt arrived to 5M19 via stretcher.  Pt ambulated from stretcher to bed without difficulty.  No c/o pain or discomfort.  Pt states she feels back to normal.  Telemetry applied and CCMD notified.  Will continue to monitor.  Cori Razor, RN

## 2016-06-13 NOTE — ED Notes (Signed)
CBG 133. Rn notified.

## 2016-06-13 NOTE — H&P (Signed)
[facility] Admission History and Physical Service Pager: [phone]  Patient name: Danielle [name] record number: [id number] Date of birth: July 13, 1956 Age: 60 y.o. Gender: female  Primary Care Provider: Zigmund Gottron, MD Consultants: Neurology Code Status: Full  Chief Complaint: Left facial droop  Assessment and Plan: Danielle [name] is a 60 y.o. female presenting after having acute onset left facial droop . PMH is significant for TIA, HTN, hypercholesterolemia, DM,   Facial droop/dysarthria- now resolved but in setting of history of TIA and risk factors, will pursue TIA work up. CT head in the Ed negative for acute intracranial process - admit to [facility] medicine teaching service, Dr Andria Frames - Neurology following, appreciate recs - asprin/plavix per neuro - MRI/MRA brain - A1c, lipid panel - consider further stroke work up if MRI +   HTN- stable - continue home metoprolol   DM- last A1c 12/2015 6.3,  - sensitive SSI  Hypercholesterolemia - continue home lipitor 40 - f/u repeat lipid panel  Hypothyroid- last TSH 12/2015 2.73 - continue home synthroid, f/u TSH  FEN/GI: Regular diet, sliv Prophylaxis: aspirin plavix   Disposition: admit to family medicine teaching service  History of Present Illness:  Danielle [name] is a 60 y.o. female presenting with left sided facial droop and dysarthria that occurred this Am at approximately 12 pm. She was gardening when she noted sharp left arm pain that lasted for approx 30 mins. She went to rest and the pan resolved. She then noted feeling as if the right side of her face was being pulled back and her tongue was being deviated. She went to a neighbor for help when he noted facial droop and that she had garbled speech. She then went to her PCP's office who recommended she come to the ED for evaluation.  Otherwise she denies SOB, chest pain, recent illness, headache, dizziness, N/V/D  Review Of Systems:  Per HPI Otherwise the remainder of the systems were negative.  Patient Active Problem List   Diagnosis Date Noted  . Chest pain 02/23/2016  . Disorder of bone 01/12/2016  . Abdominal pain, epigastric 12/10/2014  . Spinal stenosis in cervical region 07/01/2014  . Osteopenia 01/22/2014  . Pain in joint, ankle and foot 12/27/2013  . Diverticulitis large intestine w/o perforation or abscess w/o bleeding 07/25/2013  . TIA (transient ischemic attack) 04/20/2011  . DEPRESSION 03/12/2010  . OBESITY 10/08/2009  . CYST AND PSEUDOCYST OF PANCREAS 09/21/2009  . HYPERCHOLESTEROLEMIA 12/10/2008  . ROTATOR CUFF SYNDROME 12/10/2008  . COLONIC POLYPS, HYPERPLASTIC 05/02/2008  . ESSENTIAL HYPERTENSION, BENIGN 09/17/2007  . Palpitations 09/17/2007  . Diabetes mellitus type 2, controlled, without complications ([place]) [id number]  . Hypothyroidism 01/04/2007  . HYPERCALCEMIA 01/04/2007  . Quit smoking 01/04/2007  . CARPAL TUNNEL SYNDROME 01/04/2007  . GASTROESOPHAGEAL REFLUX, NO ESOPHAGITIS 01/04/2007  . CERVICAL SPINE DISORDER, NOS 01/04/2007  . BACK PAIN W/RADIATION, UNSPECIFIED 01/04/2007    Past Medical History: Past Medical History:  Diagnosis Date  . 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 Cervical spondylosis without myelopathy 09/12/2013  . Complication of anesthesia   . Diabetes mellitus without complication (HCC)    diet controlled -no meds  . Diverticulitis   . Dyslipidemia   . Family history of anesthesia complication    Mother - N/V  . Gastroesophageal reflux disease    8./25/15- no problem  . Glucose intolerance (pre-diabetes)   . Heart murmur    nothing to be concerned  . History  of blood transfusion   . History of hiatal hernia   . History of tobacco abuse   . Hypertension   . Hypothyroidism   . Kidney stone   . Obesity   . Oral thrush    after back surgeries  . PONV (postoperative nausea and vomiting)   . Stroke [facility])    TIA X 2-3; last in 2013  . Tachycardia      Past Surgical History: Past Surgical History:  Procedure Laterality Date  . ABDOMINAL HYSTERECTOMY    . APPENDECTOMY    . BACK SURGERY     10 spine surgeries since 1988.  Marland Kitchen CARPAL TUNNEL RELEASE Right 04/07/2015   Procedure: RIGHT CARPAL TUNNEL RELEASE;  Surgeon: Daryll Brod, MD;  Location: [place];  Service: Orthopedics;  Laterality: Right;  . CARPAL TUNNEL RELEASE Left 09/29/2015   Procedure: CARPAL TUNNEL RELEASE LEFT ;  Surgeon: Daryll Brod, MD;  Location: [place];  Service: Orthopedics;  Laterality: Left;  . CHOLECYSTECTOMY    . CYSTOSCOPY W/ STONE MANIPULATION    . CYSTOSCOPY W/ URETERAL STENT PLACEMENT    . POSTERIOR CERVICAL FUSION/FORAMINOTOMY N/A 07/01/2014   Procedure: RIGHT C5-6 and C6-7 FORAMINOTOMY;  Surgeon: Jessy Oto, MD;  Location: [place];  Service: Orthopedics;  Laterality: N/A;  . TONSILLECTOMY    . ULNAR NERVE TRANSPOSITION Right 04/07/2015   Procedure: DECOMPRESSION  ULNAR NERVE RIGHT ELBOW;  Surgeon: Daryll Brod, MD;  Location: [place];  Service: Orthopedics;  Laterality: Right;  . ULNAR NERVE TRANSPOSITION Left 09/29/2015   Procedure: DECOMPRESSION  ULNAR NERVE LEFT ELBOW;  Surgeon: Daryll Brod, MD;  Location: [place];  Service: Orthopedics;  Laterality: Left;    Social History: Social History  Substance Use Topics  . Smoking status: Former Smoker    Packs/day: 0.50    Years: 43.00    Types: Cigarettes  . Smokeless tobacco: Never Used     Comment: quit early 02/2016  . Alcohol use No   Additional social history: denies recreational drug use Please also refer to relevant sections of EMR.  Family History: Family History  Problem Relation Age of Onset  . Heart attack Father   . Cancer Father   . Diabetes Father   . Stroke Mother   . Diabetes Mother   . Cancer Mother   . Heart attack Brother   . Cancer Other   . Coronary artery disease Other   . Stroke Other   . Diabetes Other    . Hypertension Other   . Heart failure Other      Allergies and Medications: Allergies  Allergen Reactions  . Cephalexin Other (See Comments)    burns stomach  . Codeine Hives and Nausea And Vomiting  . Ivp Dye [Iodinated Diagnostic Agents]     States had throat swelling, "couldn't breathe", states anaphylaxis. Also states has been given IVP dye since reaction without difficulty.    No current facility-administered medications on file prior to encounter.    Current Outpatient Prescriptions on File Prior to Encounter  Medication Sig Dispense Refill  . aspirin EC 81 MG tablet Take 81 mg by mouth daily.     Marland Kitchen atorvastatin (LIPITOR) 40 MG tablet Take 1 tablet (40 mg total) by mouth daily. 90 tablet 3  . cholecalciferol (VITAMIN D) 1000 units tablet Take 1 tablet (1,000 Units total) by mouth daily. 100 tablet 3  . levothyroxine (SYNTHROID, LEVOTHROID) 200 MCG tablet TAKE 1 TABLET BY  MOUTH EVERY DAY BEFORE BREAKFAST 90 tablet 3  . metoprolol succinate (TOPROL-XL) 100 MG 24 hr tablet Take 2 tablets (200 mg total) by mouth daily. Take with or immediately following a meal. 180 tablet 3  . nitroGLYCERIN (NITROSTAT) 0.4 MG SL tablet Place 1 tablet (0.4 mg total) under the tongue every 5 (five) minutes as needed for chest pain. 50 tablet 3  . omeprazole (PRILOSEC) 40 MG capsule Take 1 capsule (40 mg total) by mouth daily. 30 capsule 3    Objective: BP (!) 141/86 (BP Location: Left Arm)   Pulse 82   Temp 99.1 F (37.3 C) (Oral)   Resp 20   Ht 5\' 5"  (1.651 m)   Wt 183 lb (83 kg)   SpO2 97%   BMI 30.45 kg/m  Exam: General: NAD HEENT: EOMI, PERRL Cardiovascular: RRR Respiratory: CTAB Abdomen: soft, non tender Extremities: no LE edema Skin: no rashes or lesions Psych: normal mood and affect Neuro: Cranial Nerves II - XII - II - Visual field intact  III, IV, VI - Extraocular movements intact. V - Facial sensation intact bilaterally. VII - Facial movement intact  bilaterally. VIII - Hearing & vestibular intact bilaterally. X - Palate elevates symmetrically, no dysarthria. XI - Chin turning & shoulder shrug intact bilaterally. XII - Tongue protrusion intact.  Motor Strength - The patient's strength was 5/5 in all extremities and pronator drift was absent. Bulk was normal and fasciculations were absent.   Cerebellar: No ataxia on finger-nose-finger bilaterally  Coordination - The patient had normal movements in the hands with no ataxia or dysmetria. Tremor was absent.  Gait and Station - normal gait and station   Labs and Imaging: CBC BMET   Recent Labs Lab 06/13/16 1515 06/13/16 1528  WBC 17.8*  --   HGB 15.5* 16.0*  HCT 47.4* 47.0*  PLT 330  --     Recent Labs Lab 06/13/16 1515 06/13/16 1528  NA 138 141  K 3.7 3.7  CL 104 104  CO2 27  --   BUN 17 20  CREATININE 0.79 0.80  GLUCOSE 114* 110*  CALCIUM 11.7*  --      CT head  No acute intracranial abnormality. ASPECTS score 10  CXR  No acute cardiopulmonary findings.   Veatrice Bourbon, MD 06/13/2016, 7:38 PM PGY-3, [facility] pager: [phone], text pages welcome

## 2016-06-13 NOTE — Telephone Encounter (Signed)
Patient walked into clinic today stating she was at home working in the garden.  She started having really sharp left arm pain down to elbow, slurred speech and facial drooping.  Patient's daughter called EMS, blood pressure was taking 189/120s.  Patient refused to be take to ED by EMS, she wanted to be seen by her PCP.  Patient stated that her symptoms did not last very long.  Nurse noted left side facial drooping, patient denied chest pain, n/v, SOB.  Patient reported some sweating, speech was clear.  BP 1170/110 manually right arm, heart rate 115, temp 98.1 orally and 95% O2 sat/room air.  Precept with Dr. Darnelle Maffucci, patient should be seen in the ED for further evaluation.  Patient's PCP was informed and agreed patient should be seen in the ED.  Patient verbalized understanding.  Derl Barrow, RN

## 2016-06-13 NOTE — Code Documentation (Signed)
60yo female arriving to [facility] at 38.  Patient from home where she was pulling weeds when she had left arm pain at 1400.  She stopped pulling weeds and noticed her left face felt swollen and her speech was slurred.  Code stroke called on patient arrival.  Patient to CT.  Stroke team to CT.  NIHSS 0, see documentation for details and code stroke times.  Patient with resolved symptoms.  TIA alert.  Bedside handoff with ED RN Archie Patten.

## 2016-06-14 DIAGNOSIS — G459 Transient cerebral ischemic attack, unspecified: Secondary | ICD-10-CM | POA: Diagnosis not present

## 2016-06-14 DIAGNOSIS — I10 Essential (primary) hypertension: Secondary | ICD-10-CM | POA: Diagnosis not present

## 2016-06-14 DIAGNOSIS — G458 Other transient cerebral ischemic attacks and related syndromes: Secondary | ICD-10-CM | POA: Diagnosis not present

## 2016-06-14 LAB — BASIC METABOLIC PANEL
Anion gap: 8 (ref 5–15)
BUN: 14 mg/dL (ref 6–20)
CALCIUM: 10.7 mg/dL — AB (ref 8.9–10.3)
CO2: 27 mmol/L (ref 22–32)
CREATININE: 0.53 mg/dL (ref 0.44–1.00)
Chloride: 102 mmol/L (ref 101–111)
GFR calc non Af Amer: >60 mL/min (ref >60)
Glucose, Bld: 117 mg/dL — ABNORMAL HIGH (ref 65–99)
Potassium: 3.8 mmol/L (ref 3.5–5.1)
SODIUM: 137 mmol/L (ref 135–145)

## 2016-06-14 LAB — GLUCOSE, CAPILLARY
GLUCOSE-CAPILLARY: 133 mg/dL — AB (ref 65–99)
GLUCOSE-CAPILLARY: 105 mg/dL — AB (ref 65–99)

## 2016-06-14 LAB — LIPID PANEL
CHOL/HDL RATIO: 4.6 ratio
Cholesterol: 160 mg/dL (ref 0–200)
HDL: 35 mg/dL — ABNORMAL LOW (ref >40)
LDL CALC: 95 mg/dL (ref 0–99)
Triglycerides: 149 mg/dL (ref <150)
VLDL: 30 mg/dL (ref 0–40)

## 2016-06-14 LAB — CBC
HCT: 42.5 % (ref 36.0–46.0)
HEMOGLOBIN: 13.7 g/dL (ref 12.0–15.0)
MCH: 29.8 pg (ref 26.0–34.0)
MCHC: 32.2 g/dL (ref 30.0–36.0)
MCV: 92.4 fL (ref 78.0–100.0)
Platelets: 263 10*3/uL (ref 150–400)
RBC: 4.6 MIL/uL (ref 3.87–5.11)
RDW: 13.2 % (ref 11.5–15.5)
WBC: 13.9 10*3/uL — ABNORMAL HIGH (ref 4.0–10.5)

## 2016-06-14 LAB — TSH: TSH: 0.352 u[IU]/mL (ref 0.350–4.500)

## 2016-06-14 MED ORDER — ATORVASTATIN CALCIUM 80 MG PO TABS: 80.0000 mg | ORAL_TABLET | Freq: Every day | ORAL | Status: DC

## 2016-06-14 MED ORDER — CLOPIDOGREL BISULFATE 75 MG PO TABS: 75.0000 mg | ORAL_TABLET | Freq: Every day | ORAL | 0 refills | Status: DC

## 2016-06-14 MED ORDER — ATORVASTATIN CALCIUM 80 MG PO TABS: 80.0000 mg | ORAL_TABLET | Freq: Every day | ORAL | 0 refills | Status: DC

## 2016-06-14 NOTE — Progress Notes (Signed)
Patient is discharged from room 5M19 at this time. Alert and in stable condition. IV site d/c'd as well as tele. Instructions read to patient with understanding verbalized. Left unit via wheelchair with all belongings at side.

## 2016-06-14 NOTE — Evaluation (Signed)
Physical Therapy Evaluation and Discharge Patient Details Name: Danielle [name] MRN: [id number] DOB: [birth year] Today's Date: 06/14/2016   History of Present Illness  Danielle [name] is a 60 y.o. female presenting after having acute onset left facial droop . PMH is significant for TIA, HTN, hypercholesterolemia, DM,   Clinical Impression  Pt admitted with above diagnosis. All symptoms have resolved and pt back to baseline, functioning indep. Pt with no further acute PT needs at this time. Please re-consult if needed in the future. PT SIGNING OFF.     Follow Up Recommendations No PT follow up    Equipment Recommendations  None recommended by PT    Recommendations for Other Services       Precautions / Restrictions Precautions Precautions: None Restrictions Weight Bearing Restrictions: No      Mobility  Bed Mobility Overal bed mobility: Independent                Transfers Overall transfer level: Independent Equipment used: None             General transfer comment: no difficulties  Ambulation/Gait Ambulation/Gait assistance: Independent Ambulation Distance (Feet): 300 Feet Assistive device: None Gait Pattern/deviations: WFL(Within Functional Limits) Gait velocity: wfl Gait velocity interpretation: at or above normal speed for age/gender General Gait Details: no episodes of LOB  Stairs Stairs: Yes Stairs assistance: Modified independent (Device/Increase time) Stair Management: One rail Right;Alternating pattern Number of Stairs: 5 General stair comments: good technique  Wheelchair Mobility    Modified Rankin (Stroke Patients Only) Modified Rankin (Stroke Patients Only) Pre-Morbid Rankin Score: No symptoms Modified Rankin: No symptoms     Balance                                 Standardized Balance Assessment Standardized Balance Assessment : Dynamic Gait Index   Dynamic Gait Index Level Surface: Normal Change in Gait Speed:  Normal Gait with Horizontal Head Turns: Normal Gait with Vertical Head Turns: Normal Gait and Pivot Turn: Normal Step Over Obstacle: Normal Step Around Obstacles: Normal Steps: Mild Impairment Total Score: 23       Pertinent Vitals/Pain Pain Assessment: No/denies pain    Home Living Family/patient expects to be discharged to:: Private residence Living Arrangements: Alone Available Help at Discharge: Family;Available PRN/intermittently Type of Home: House Home Access: Stairs to enter Entrance Stairs-Rails: Right Entrance Stairs-Number of Steps: 4 Home Layout: One level Home Equipment: None      Prior Function Level of Independence: Independent               Hand Dominance   Dominant Hand: Right    Extremity/Trunk Assessment   Upper Extremity Assessment: Overall WFL for tasks assessed           Lower Extremity Assessment: Overall WFL for tasks assessed (pt reports "with all my back surgeries my legs stay wierd")      Cervical / Trunk Assessment: Normal  Communication   Communication: No difficulties  Cognition Arousal/Alertness: Awake/alert Behavior During Therapy: WFL for tasks assessed/performed Overall Cognitive Status: Within Functional Limits for tasks assessed                      General Comments General comments (skin integrity, edema, etc.): Educated pt on FAST and to come to hospital if she experiences symptoms again    Exercises        Assessment/Plan    PT Assessment  Patent does not need any further PT services  PT Diagnosis Generalized weakness   PT Problem List    PT Treatment Interventions     PT Goals (Current goals can be found in the Care Plan section) Acute Rehab PT Goals Patient Stated Goal: home today PT Goal Formulation: All assessment and education complete, DC therapy    Frequency     Barriers to discharge        Co-evaluation               End of Session Equipment Utilized During Treatment:  Gait belt Activity Tolerance: Patient tolerated treatment well Patient left: in chair;with call bell/phone within reach Nurse Communication: Mobility status    Functional Assessment Tool Used: clinical judgement Functional Limitation: Mobility: Walking and moving around Mobility: Walking and Moving Around Current Status [phone]): 0 percent impaired, limited or restricted Mobility: Walking and Moving Around Goal Status [phone]): 0 percent impaired, limited or restricted Mobility: Walking and Moving Around Discharge Status [phone]): 0 percent impaired, limited or restricted    Time: 0925-0942 PT Time Calculation (min) (ACUTE ONLY): 17 min   Charges:   PT Evaluation $PT Eval Moderate Complexity: 1 Procedure     PT G Codes:   PT G-Codes **NOT FOR INPATIENT CLASS** Functional Assessment Tool Used: clinical judgement Functional Limitation: Mobility: Walking and moving around Mobility: Walking and Moving Around Current Status [phone]): 0 percent impaired, limited or restricted Mobility: Walking and Moving Around Goal Status [phone]): 0 percent impaired, limited or restricted Mobility: Walking and Moving Around Discharge Status [phone]): 0 percent impaired, limited or restricted    Kingsley Callander 06/14/2016, 10:19 AM  Kittie Plater, PT, DPT Pager #: [phone] Office #: [phone]

## 2016-06-14 NOTE — Progress Notes (Signed)
Subjective:  Danielle [name] is a lovely 60 year old patient who is resting comfortably in bed this morning. She reports her symptoms resolved yesterday and she is presently asymptomatic. Her MRI and MRA were reviewed and were unremarkable with no evidence of ischemic change.  Exam: Vitals:   06/14/16 0500 06/14/16 0700  BP: (!) 146/75 (!) 156/90  Pulse: 63 70  Resp: 18 18  Temp: 98 F (36.7 C)     HEENT-  Normocephalic, no lesions, without obvious abnormality.  Normal external eye and conjunctiva.  Normal TM's bilaterally.  Normal auditory canals and external ears. Normal external nose, mucus membranes and septum.  Normal pharynx. Cardiovascular- regular rate and rhythm, S1, S2 normal, no murmur, click, rub or gallop, pulses palpable throughout   Lungs- chest clear, no wheezing, rales, normal symmetric air entry, Heart exam - S1, S2 normal, no murmur, no gallop, rate regular Abdomen- soft, non-tender; bowel sounds normal; no masses,  no organomegaly Extremities- less then 2 second capillary refill Lymph-no adenopathy palpable Musculoskeletal-no joint tenderness, deformity or swelling Skin-warm and dry, no hyperpigmentation, vitiligo, or suspicious lesions    Gen: In bed, NAD MS: Awake alert and oriented CN: Cranial nerves I through XII are intact. Motor: 5 out of 5 bilaterally with no pronator drift. Sensory: Intact bilaterally. DTR: 1-2+ bilaterally  Pertinent Labs/Diagnostics: Reviewed    Impression:   Danielle [name] is a 60 year old patient who experienced a transient sensation of slurring of speech and heaviness in the left upper extremity. Her exam was normal yesterday in the emergency room. Her neuro imaging reveals no evidence of ischemic change.   Recommendations:  1. Home this morning with follow up with primary care physician.   Lenton Gendreau A. Tasia Catchings, M.D. Neurohospitalist Phone: [phone]   06/14/2016, 7:58 AM

## 2016-06-14 NOTE — Care Management Note (Signed)
Case Management Note  Patient Details  Name: Danielle [name] MRN: [id number] Date of Birth: Feb 10, 1956  Subjective/Objective:                    Action/Plan: Pt discharging home with self care. No further needs per CM.   Expected Discharge Date:                  Expected Discharge Plan:  Home/Self Care  In-House Referral:     Discharge planning Services     Post Acute Care Choice:    Choice offered to:     DME Arranged:    DME Agency:     HH Arranged:    No Name Agency:     Status of Service:  Completed, signed off  If discussed at [employer] of Stay Meetings, dates discussed:    Additional Comments:  Pollie Friar, RN 06/14/2016, 3:48 PM

## 2016-06-14 NOTE — Care Management Obs Status (Signed)
[place] NOTIFICATION   Patient Details  Name: LINNET URNESS MRN: [id number] Date of Birth: 18-Dec-1955   Medicare Observation Status Notification Given:  Yes (MRI negative)    Pollie Friar, RN 06/14/2016, 11:37 AM

## 2016-06-14 NOTE — Progress Notes (Signed)
OT Screen Note  Patient Details Name: Danielle [name] MRN: [id number] DOB: [birth year]   Cancelled Treatment:    Reason Eval/Treat Not Completed: OT screened, no needs identified, will sign off Spoke with PT Ashly and reports patient is at baseline Independent.   Vonita Moss   OTR/L Pager: [phone] Office: [phone] .  06/14/2016, 11:38 AM

## 2016-06-14 NOTE — Discharge Summary (Signed)
[facility] Discharge Summary  Patient name: Danielle [name] record number: [id number] Date of birth: 1956-02-03 Age: 60 y.o. Gender: female Date of Admission: 06/13/2016  Date of Discharge: 06/14/2016 Admitting Physician: [name], MD  Primary Care Provider: Zigmund Gottron, MD Consultants: Neurology  Indication for Hospitalization: acute onset left facial droop  Discharge Diagnoses/Problem List:  HTN, DM, HLD, hypothyroid, history of TIA  Disposition: Discharge home  Discharge Condition: Stable  Discharge Exam:  Temp:  [98 F (36.7 C)-99.3 F (37.4 C)] 98 F (36.7 C) (08/08 0500) Pulse Rate:  [63-114] 63 (08/08 0500) Resp:  [10-24] 18 (08/08 0500) BP: (120-169)/(60-107) 146/75 (08/08 0500) SpO2:  [93 %-99 %] 98 % (08/08 0500) Weight:  [83 kg (182 lb 15.7 oz)-83 kg (183 lb)] 83 kg (182 lb 15.7 oz) (08/07 2100) Physical Exam: General: NAD Cardiovascular: RRR, no m/r/g Respiratory: CTA bil Abdomen: soft, nontender Extremities: full ROM, warm and well perfused NEURO: AAOx3, CNII-XII grossly intact   Brief Hospital Course:  Patient was admitted to the hospital after she experienced acute onset left-sided facial droop and dysarthria, which resolved on its own. Because the patient had a history of TIA and risk factors, TIA workup was pursued.  MRI/MRA brain was negative for acute intracranial process, specifically no acute ischemia.  Lipid panel was within normal limits.  The patient was started on asprin and plavix and discharged home for close follow up with PCP.  Neurology exams were within normal limits throughout hospitalization.  Issues for Follow Up:  1. HgA1C pending results 2. Consider ECHO and carotid doppler as an outpatient  Significant Procedures:  Dg Chest 2 View  Result Date: 06/13/2016 CLINICAL DATA:  TIA. EXAM: CHEST  2 VIEW COMPARISON:  02/23/2016 FINDINGS: The cardiac silhouette, mediastinal and hilar contours are  within normal limits and stable. The lungs are clear. No pleural effusion. The bony thorax is intact. Lumbar fusion hardware noted. IMPRESSION: No acute cardiopulmonary findings. Electronically Signed   By: Marijo Sanes M.D.   On: 06/13/2016 18:59   Mr Brain Wo Contrast  Result Date: 06/14/2016 CLINICAL DATA:  Acute onset LEFT facial droop. History of hypertension, hyperlipidemia, diabetes and stroke. EXAM: MRI HEAD WITHOUT CONTRAST MRA HEAD WITHOUT CONTRAST TECHNIQUE: Multiplanar, multiecho pulse sequences of the brain and surrounding structures were obtained without intravenous contrast. Angiographic images of the head were obtained using MRA technique without contrast. COMPARISON:  CT HEAD June 13, 2016 at 1531 hours and MRI of the brain December 20, 2011 FINDINGS: MRI HEAD FINDINGS INTRACRANIAL CONTENTS: No reduced diffusion to suggest acute ischemia. No susceptibility artifact to suggest hemorrhage. The ventricles and sulci are normal for patient's age. Patchy relatively stable supratentorial white matter FLAIR T2 hyperintensities considering 3 T technique. No suspicious parenchymal signal, masses or mass effect. No abnormal extra-axial fluid collections. No extra-axial masses though, contrast enhanced sequences would be more sensitive. Normal major intracranial vascular flow voids present at skull base. ORBITS: The included ocular globes and orbital contents are non-suspicious. SINUSES: The mastoid air-cells and included paranasal sinuses are well-aerated. SKULL/SOFT TISSUES: No abnormal sellar expansion. No suspicious calvarial bone marrow signal. Craniocervical junction maintained. MRA HEAD FINDINGS ANTERIOR CIRCULATION: Normal flow related enhancement of the included cervical, petrous, cavernous and supraclinoid internal carotid arteries. Patent anterior communicating artery. Normal flow related enhancement of the anterior and middle cerebral arteries, including distal segments. No large vessel  occlusion, high-grade stenosis, abnormal luminal irregularity, aneurysm. POSTERIOR CIRCULATION: RIGHT vertebral artery is dominant. Basilar artery  is patent, with normal flow related enhancement of the main branch vessels. Normal flow related enhancement of the posterior cerebral arteries. No large vessel occlusion, high-grade stenosis, abnormal luminal irregularity, aneurysm. IMPRESSION: MRI HEAD: No acute intracranial process, specifically no acute ischemia. Relatively stable mild chronic small vessel ischemic disease. MRA HEAD: Negative. Electronically Signed   By: Elon Alas M.D.   On: 06/14/2016 00:57   Mr Jodene Nam Head/brain [id number] Cm  Result Date: 06/14/2016 CLINICAL DATA:  Acute onset LEFT facial droop. History of hypertension, hyperlipidemia, diabetes and stroke. EXAM: MRI HEAD WITHOUT CONTRAST MRA HEAD WITHOUT CONTRAST TECHNIQUE: Multiplanar, multiecho pulse sequences of the brain and surrounding structures were obtained without intravenous contrast. Angiographic images of the head were obtained using MRA technique without contrast. COMPARISON:  CT HEAD June 13, 2016 at 1531 hours and MRI of the brain December 20, 2011 FINDINGS: MRI HEAD FINDINGS INTRACRANIAL CONTENTS: No reduced diffusion to suggest acute ischemia. No susceptibility artifact to suggest hemorrhage. The ventricles and sulci are normal for patient's age. Patchy relatively stable supratentorial white matter FLAIR T2 hyperintensities considering 3 T technique. No suspicious parenchymal signal, masses or mass effect. No abnormal extra-axial fluid collections. No extra-axial masses though, contrast enhanced sequences would be more sensitive. Normal major intracranial vascular flow voids present at skull base. ORBITS: The included ocular globes and orbital contents are non-suspicious. SINUSES: The mastoid air-cells and included paranasal sinuses are well-aerated. SKULL/SOFT TISSUES: No abnormal sellar expansion. No suspicious calvarial bone  marrow signal. Craniocervical junction maintained. MRA HEAD FINDINGS ANTERIOR CIRCULATION: Normal flow related enhancement of the included cervical, petrous, cavernous and supraclinoid internal carotid arteries. Patent anterior communicating artery. Normal flow related enhancement of the anterior and middle cerebral arteries, including distal segments. No large vessel occlusion, high-grade stenosis, abnormal luminal irregularity, aneurysm. POSTERIOR CIRCULATION: RIGHT vertebral artery is dominant. Basilar artery is patent, with normal flow related enhancement of the main branch vessels. Normal flow related enhancement of the posterior cerebral arteries. No large vessel occlusion, high-grade stenosis, abnormal luminal irregularity, aneurysm. IMPRESSION: MRI HEAD: No acute intracranial process, specifically no acute ischemia. Relatively stable mild chronic small vessel ischemic disease. MRA HEAD: Negative. Electronically Signed   By: Elon Alas M.D.   On: 06/14/2016 00:57   Ct Head Code Stroke Wo Contrast  Result Date: 06/13/2016 CLINICAL DATA:  Code stroke. Code stroke. Slurred speech and facial droop EXAM: CT HEAD WITHOUT CONTRAST TECHNIQUE: Contiguous axial images were obtained from the base of the skull through the vertex without intravenous contrast. COMPARISON:  MRI head 12/20/2011 FINDINGS: Ventricle size normal.  Cerebral volume normal for age Negative for acute infarct. Negative for intracranial hemorrhage. No mass or edema or midline shift. Regional soft tissues intact.  Normal orbit. Negative calvarium. ASPECTS [facility] Stroke Program Early CT Score, http://www.aspectsinstroke.com) - Ganglionic level infarction (caudate, lentiform nuclei, internal capsule, insula, M1-M3 cortex): 7 - Supraganglionic infarction (M4-M6 cortex): 3 Total score (0-10 with 10 being normal): 10 IMPRESSION: 1. No acute intracranial abnormality. 2. ASPECTS score 10 These results were called by telephone at the time of  interpretation on 06/13/2016 at 3:42 pm to Dr. Tasia Catchings, who verbally acknowledged these results. Electronically Signed   By: Franchot Gallo M.D.   On: 06/13/2016 15:42    Significant Labs and Imaging:   Recent Labs Lab 06/13/16 1515 06/13/16 1528 06/14/16 0220  WBC 17.8*  --  13.9*  HGB 15.5* 16.0* 13.7  HCT 47.4* 47.0* 42.5  PLT 330  --  263    Recent Labs  Lab 06/13/16 1515 06/13/16 1528 06/14/16 0220  NA 138 141 137  K 3.7 3.7 3.8  CL 104 104 102  CO2 27  --  27  GLUCOSE 114* 110* 117*  BUN 17 20 14   CREATININE 0.79 0.80 0.53  CALCIUM 11.7*  --  10.7*  ALKPHOS 102  --   --   AST 22  --   --   ALT 24  --   --   ALBUMIN 4.5  --   --       Results/Tests Pending at Time of Discharge: HbA1C  Discharge Medications:    Medication List    TAKE these medications   aspirin EC 81 MG tablet Take 81 mg by mouth daily.   atorvastatin 80 MG tablet Commonly known as:  LIPITOR Take 1 tablet (80 mg total) by mouth daily at 6 PM. What changed:  medication strength  how much to take  when to take this   clopidogrel 75 MG tablet Commonly known as:  PLAVIX Take 1 tablet (75 mg total) by mouth daily.   ibuprofen 800 MG tablet Commonly known as:  ADVIL,MOTRIN Take 800 mg by mouth every 8 (eight) hours as needed for pain.   levothyroxine 200 MCG tablet Commonly known as:  SYNTHROID, LEVOTHROID TAKE 1 TABLET BY MOUTH EVERY DAY BEFORE BREAKFAST   metoprolol succinate 100 MG 24 hr tablet Commonly known as:  TOPROL-XL Take 2 tablets (200 mg total) by mouth daily. Take with or immediately following a meal. What changed:  how much to take  when to take this  additional instructions   nitroGLYCERIN 0.4 MG SL tablet Commonly known as:  NITROSTAT Place 1 tablet (0.4 mg total) under the tongue every 5 (five) minutes as needed for chest pain.       Discharge Instructions: Please refer to Patient Instructions section of EMR for full details.  Patient was counseled  important signs and symptoms that should prompt return to medical care, changes in medications, dietary instructions, activity restrictions, and follow up appointments.   Follow-Up Appointments: Follow-up Information    Zigmund Gottron, MD Follow up on 06/16/2016.   Specialty:  Family Medicine Why:  Please follow up with Dr. Andria Frames on Thursday, 06/16/2016 at 11:00 am.  Contact information: [place] [address] [phone]           Everrett Coombe, MD 06/15/2016, 8:11 PM PGY-1, [place]

## 2016-06-14 NOTE — Evaluation (Signed)
Speech Language Pathology Evaluation Patient Details Name: Danielle [name] MRN: [id number] DOB: [birth year] Today's Date: 06/14/2016 Time: [id number] SLP Time Calculation (min) (ACUTE ONLY): 9 min  Problem List:  Patient Active Problem List   Diagnosis Date Noted  . Chest pain 02/23/2016  . Disorder of bone 01/12/2016  . Abdominal pain, epigastric 12/10/2014  . Spinal stenosis in cervical region 07/01/2014  . Osteopenia 01/22/2014  . Pain in joint, ankle and foot 12/27/2013  . Diverticulitis large intestine w/o perforation or abscess w/o bleeding 07/25/2013  . TIA (transient ischemic attack) 04/20/2011  . DEPRESSION 03/12/2010  . OBESITY 10/08/2009  . CYST AND PSEUDOCYST OF PANCREAS 09/21/2009  . HYPERCHOLESTEROLEMIA 12/10/2008  . ROTATOR CUFF SYNDROME 12/10/2008  . COLONIC POLYPS, HYPERPLASTIC 05/02/2008  . Essential hypertension 09/17/2007  . Palpitations 09/17/2007  . Diabetes mellitus type 2, controlled, without complications ([place]) [id number]  . Hypothyroidism 01/04/2007  . HYPERCALCEMIA 01/04/2007  . Quit smoking 01/04/2007  . CARPAL TUNNEL SYNDROME 01/04/2007  . GASTROESOPHAGEAL REFLUX, NO ESOPHAGITIS 01/04/2007  . CERVICAL SPINE DISORDER, NOS 01/04/2007  . BACK PAIN W/RADIATION, UNSPECIFIED 01/04/2007   Past Medical History:  Past Medical History:  Diagnosis Date  . 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 Cervical spondylosis without myelopathy 09/12/2013  . Complication of anesthesia   . Diabetes mellitus without complication ([facility])    diet controlled -no meds  . Diverticulitis   . Dyslipidemia   . Family history of anesthesia complication    Mother - N/V  . Gastroesophageal reflux disease    8./25/15- no problem  . Glucose intolerance (pre-diabetes)   . Heart murmur    nothing to be concerned  . History of blood transfusion   . History of hiatal hernia   . History of tobacco abuse   . Hypertension   . Hypothyroidism   . Kidney stone   . Obesity   . Oral thrush     after back surgeries  . PONV (postoperative nausea and vomiting)   . Stroke [facility])    TIA X 2-3; last in 2013  . Tachycardia    Past Surgical History:  Past Surgical History:  Procedure Laterality Date  . ABDOMINAL HYSTERECTOMY    . APPENDECTOMY    . BACK SURGERY     10 spine surgeries since 1988.  Marland Kitchen CARPAL TUNNEL RELEASE Right 04/07/2015   Procedure: RIGHT CARPAL TUNNEL RELEASE;  Surgeon: Daryll Brod, MD;  Location: [facility] [facility];  Service: Orthopedics;  Laterality: Right;  . CARPAL TUNNEL RELEASE Left 09/29/2015   Procedure: CARPAL TUNNEL RELEASE LEFT ;  Surgeon: Daryll Brod, MD;  Location: [place];  Service: Orthopedics;  Laterality: Left;  . CHOLECYSTECTOMY    . CYSTOSCOPY W/ STONE MANIPULATION    . CYSTOSCOPY W/ URETERAL STENT PLACEMENT    . POSTERIOR CERVICAL FUSION/FORAMINOTOMY N/A 07/01/2014   Procedure: RIGHT C5-6 and C6-7 FORAMINOTOMY;  Surgeon: Jessy Oto, MD;  Location: [place];  Service: Orthopedics;  Laterality: N/A;  . TONSILLECTOMY    . ULNAR NERVE TRANSPOSITION Right 04/07/2015   Procedure: DECOMPRESSION  ULNAR NERVE RIGHT ELBOW;  Surgeon: Daryll Brod, MD;  Location: [place];  Service: Orthopedics;  Laterality: Right;  . ULNAR NERVE TRANSPOSITION Left 09/29/2015   Procedure: DECOMPRESSION  ULNAR NERVE LEFT ELBOW;  Surgeon: Daryll Brod, MD;  Location: [place];  Service: Orthopedics;  Laterality: Left;   HPI:  Danielle [name] is a 60 y.o. female presenting after having acute  onset left facial droop . PMH is significant for TIA, HTN, hypercholesterolemia, DM   Assessment / Plan / Recommendation Clinical Impression  Patient demonstrates trace-mild left sided lingual deviation and labial droop; however, this does not appear to impact ROM or function as patient's speech is fully intelligible.  Patient reports no speech deficits at this time; as a result, no skilled SLP services are warranted at this time.       SLP Assessment  Patient does not need any further Speech Lanaguage Pathology Services    Follow Up Recommendations  None               SLP Evaluation Prior Functioning  Cognitive/Linguistic Baseline: Within functional limits Type of Home: House Available Help at Discharge: Family;Available PRN/intermittently   Cognition  Overall Cognitive Status: Within Functional Limits for tasks assessed    Comprehension       Expression Written Expression Dominant Hand: Right   Oral / Motor  Oral Motor/Sensory Function Overall Oral Motor/Sensory Function: Mild impairment Facial ROM: Within Functional Limits Facial Symmetry: Abnormal symmetry left Facial Strength: Within Functional Limits Facial Sensation: Within Functional Limits Lingual ROM: Within Functional Limits Lingual Symmetry: Abnormal symmetry left Lingual Strength: Within Functional Limits Lingual Sensation: Within Functional Limits Velum: Within Functional Limits Mandible: Within Functional Limits Motor Speech Overall Motor Speech: Appears within functional limits for tasks assessed   GO          Functional Assessment Tool Used: skilled clinical judgement  Functional Limitations: Motor speech Motor Speech Current Status [phone]): 0 percent impaired, limited or restricted Motor Speech Goal Status [id number]): 0 percent impaired, limited or restricted Motor Speech Goal Status [id number]): 0 percent impaired, limited or restricted         Carmelia Roller., [place]  [place] 06/14/2016, 12:09 PM

## 2016-06-14 NOTE — Progress Notes (Signed)
Family Medicine Teaching Service Daily Progress Note Intern Pager: [phone]  Patient name: [name] record number: [id number] Date of birth: April 29, 1956 Age: 60 y.o. Gender: female  Primary Care Provider: Zigmund Gottron, MD Consultants: Neurology Code Status: FULL  Pt Overview and Major Events to Date:  06/14/2016: Admit to FMTS  Assessment and Plan: Danielle [name] is a 60 y.o. female presenting after having acute onset left facial droop . PMH is significant for TIA, HTN, hypercholesterolemia, DM, hypothyroidism, Depression, GERD, spinal stenosis  Facial droop/dysarthria- now resolved but in setting of history of TIA and risk factors, will pursue TIA work up. CT head in the Ed negative for acute intracranial process. MRI/MRA brain negative for acute intracranial process, specifically no acute ischemia.  Lipid panel WNL.  - asprin/plavix  - f/u A1c - Appreciate neuro recs- home today with PCP follow up  HTN- stable.  Mildly elevated BP this AM at 146/75. - continue home metoprolol   DM- last A1c 12/2015 6.3, repeat A1C pending. AM CBG 133 - sensitive SSI  Hypercholesterolemia, stable.  Lipid panel at admission WNL. - continue home lipitor 40   Hypothyroid- TSH at admission WNL at 0.352 - continue home synthroid  FEN/GI: Regular diet, sliv Prophylaxis: aspirin plavix  Disposition: DC home pending completion of stroke workup if it continues to be negative.  Subjective:  Feeling well this AM, no neurologic symptoms  Objective: Temp:  [98 F (36.7 C)-99.3 F (37.4 C)] 98 F (36.7 C) (08/08 0500) Pulse Rate:  [63-114] 63 (08/08 0500) Resp:  [10-24] 18 (08/08 0500) BP: (120-169)/(60-107) 146/75 (08/08 0500) SpO2:  [93 %-99 %] 98 % (08/08 0500) Weight:  [83 kg (182 lb 15.7 oz)-83 kg (183 lb)] 83 kg (182 lb 15.7 oz) (08/07 2100) Physical Exam: General: NAD Cardiovascular: RRR, no m/r/g Respiratory: CTA bil Abdomen: soft, nontender Extremities: full ROM,  warm and well perfused NEURO: AAOx3, CNII-XII grossly intact  Laboratory:  Recent Labs Lab 06/13/16 1515 06/13/16 1528 06/14/16 0220  WBC 17.8*  --  13.9*  HGB 15.5* 16.0* 13.7  HCT 47.4* 47.0* 42.5  PLT 330  --  263    Recent Labs Lab 06/13/16 1515 06/13/16 1528 06/14/16 0220  NA 138 141 137  K 3.7 3.7 3.8  CL 104 104 102  CO2 27  --  27  BUN 17 20 14   CREATININE 0.79 0.80 0.53  CALCIUM 11.7*  --  10.7*  PROT 7.6  --   --   BILITOT 0.5  --   --   ALKPHOS 102  --   --   ALT 24  --   --   AST 22  --   --   GLUCOSE 114* 110* 117*     Imaging/Diagnostic Tests: Dg Chest 2 View  Result Date: 06/13/2016 CLINICAL DATA:  TIA. EXAM: CHEST  2 VIEW COMPARISON:  02/23/2016 FINDINGS: The cardiac silhouette, mediastinal and hilar contours are within normal limits and stable. The lungs are clear. No pleural effusion. The bony thorax is intact. Lumbar fusion hardware noted. IMPRESSION: No acute cardiopulmonary findings. Electronically Signed   By: Marijo Sanes M.D.   On: 06/13/2016 18:59   Mr Brain Wo Contrast  Result Date: 06/14/2016 CLINICAL DATA:  Acute onset LEFT facial droop. History of hypertension, hyperlipidemia, diabetes and stroke. EXAM: MRI HEAD WITHOUT CONTRAST MRA HEAD WITHOUT CONTRAST TECHNIQUE: Multiplanar, multiecho pulse sequences of the brain and surrounding structures were obtained without intravenous contrast. Angiographic images of the head were obtained  using MRA technique without contrast. COMPARISON:  CT HEAD June 13, 2016 at 1531 hours and MRI of the brain December 20, 2011 FINDINGS: MRI HEAD FINDINGS INTRACRANIAL CONTENTS: No reduced diffusion to suggest acute ischemia. No susceptibility artifact to suggest hemorrhage. The ventricles and sulci are normal for patient's age. Patchy relatively stable supratentorial white matter FLAIR T2 hyperintensities considering 3 T technique. No suspicious parenchymal signal, masses or mass effect. No abnormal extra-axial fluid  collections. No extra-axial masses though, contrast enhanced sequences would be more sensitive. Normal major intracranial vascular flow voids present at skull base. ORBITS: The included ocular globes and orbital contents are non-suspicious. SINUSES: The mastoid air-cells and included paranasal sinuses are well-aerated. SKULL/SOFT TISSUES: No abnormal sellar expansion. No suspicious calvarial bone marrow signal. Craniocervical junction maintained. MRA HEAD FINDINGS ANTERIOR CIRCULATION: Normal flow related enhancement of the included cervical, petrous, cavernous and supraclinoid internal carotid arteries. Patent anterior communicating artery. Normal flow related enhancement of the anterior and middle cerebral arteries, including distal segments. No large vessel occlusion, high-grade stenosis, abnormal luminal irregularity, aneurysm. POSTERIOR CIRCULATION: RIGHT vertebral artery is dominant. Basilar artery is patent, with normal flow related enhancement of the main branch vessels. Normal flow related enhancement of the posterior cerebral arteries. No large vessel occlusion, high-grade stenosis, abnormal luminal irregularity, aneurysm. IMPRESSION: MRI HEAD: No acute intracranial process, specifically no acute ischemia. Relatively stable mild chronic small vessel ischemic disease. MRA HEAD: Negative. Electronically Signed   By: Elon Alas M.D.   On: 06/14/2016 00:57   Mr Jodene Nam Head/brain [id number] Cm  Result Date: 06/14/2016 CLINICAL DATA:  Acute onset LEFT facial droop. History of hypertension, hyperlipidemia, diabetes and stroke. EXAM: MRI HEAD WITHOUT CONTRAST MRA HEAD WITHOUT CONTRAST TECHNIQUE: Multiplanar, multiecho pulse sequences of the brain and surrounding structures were obtained without intravenous contrast. Angiographic images of the head were obtained using MRA technique without contrast. COMPARISON:  CT HEAD June 13, 2016 at 1531 hours and MRI of the brain December 20, 2011 FINDINGS: MRI HEAD FINDINGS  INTRACRANIAL CONTENTS: No reduced diffusion to suggest acute ischemia. No susceptibility artifact to suggest hemorrhage. The ventricles and sulci are normal for patient's age. Patchy relatively stable supratentorial white matter FLAIR T2 hyperintensities considering 3 T technique. No suspicious parenchymal signal, masses or mass effect. No abnormal extra-axial fluid collections. No extra-axial masses though, contrast enhanced sequences would be more sensitive. Normal major intracranial vascular flow voids present at skull base. ORBITS: The included ocular globes and orbital contents are non-suspicious. SINUSES: The mastoid air-cells and included paranasal sinuses are well-aerated. SKULL/SOFT TISSUES: No abnormal sellar expansion. No suspicious calvarial bone marrow signal. Craniocervical junction maintained. MRA HEAD FINDINGS ANTERIOR CIRCULATION: Normal flow related enhancement of the included cervical, petrous, cavernous and supraclinoid internal carotid arteries. Patent anterior communicating artery. Normal flow related enhancement of the anterior and middle cerebral arteries, including distal segments. No large vessel occlusion, high-grade stenosis, abnormal luminal irregularity, aneurysm. POSTERIOR CIRCULATION: RIGHT vertebral artery is dominant. Basilar artery is patent, with normal flow related enhancement of the main branch vessels. Normal flow related enhancement of the posterior cerebral arteries. No large vessel occlusion, high-grade stenosis, abnormal luminal irregularity, aneurysm. IMPRESSION: MRI HEAD: No acute intracranial process, specifically no acute ischemia. Relatively stable mild chronic small vessel ischemic disease. MRA HEAD: Negative. Electronically Signed   By: Elon Alas M.D.   On: 06/14/2016 00:57   Ct Head Code Stroke Wo Contrast  Result Date: 06/13/2016 CLINICAL DATA:  Code stroke. Code stroke. Slurred speech and facial droop EXAM: CT  HEAD WITHOUT CONTRAST TECHNIQUE: Contiguous  axial images were obtained from the base of the skull through the vertex without intravenous contrast. COMPARISON:  MRI head 12/20/2011 FINDINGS: Ventricle size normal.  Cerebral volume normal for age Negative for acute infarct. Negative for intracranial hemorrhage. No mass or edema or midline shift. Regional soft tissues intact.  Normal orbit. Negative calvarium. ASPECTS [facility] Stroke Program Early CT Score, http://www.aspectsinstroke.com) - Ganglionic level infarction (caudate, lentiform nuclei, internal capsule, insula, M1-M3 cortex): 7 - Supraganglionic infarction (M4-M6 cortex): 3 Total score (0-10 with 10 being normal): 10 IMPRESSION: 1. No acute intracranial abnormality. 2. ASPECTS score 10 These results were called by telephone at the time of interpretation on 06/13/2016 at 3:42 pm to Dr. Tasia Catchings, who verbally acknowledged these results. Electronically Signed   By: Franchot Gallo M.D.   On: 06/13/2016 15:42    Danielle Coombe, MD 06/14/2016, 6:36 AM PGY-1, [place] Intern pager: [phone], text pages welcome

## 2016-06-14 NOTE — Discharge Instructions (Addendum)
You were hospitalized because you had symptoms that were worrisome for a stroke, however your head CT and MRI/MRA were negative.   You have an appointment scheduled with your PCP for hospital follow up.

## 2016-06-15 LAB — HEMOGLOBIN A1C
HEMOGLOBIN A1C: 6.5 % — AB (ref 4.8–5.6)
MEAN PLASMA GLUCOSE: 140 mg/dL

## 2016-06-16 ENCOUNTER — Ambulatory Visit (INDEPENDENT_AMBULATORY_CARE_PROVIDER_SITE_OTHER): Payer: Medicare Other | Admitting: Family Medicine

## 2016-06-16 ENCOUNTER — Encounter: Payer: Self-pay | Admitting: Family Medicine

## 2016-06-16 VITALS — BP 119/85 | HR 97 | Temp 98.4°F | Ht 65.0 in | Wt 177.2 lb

## 2016-06-16 DIAGNOSIS — Z720 Tobacco use: Secondary | ICD-10-CM | POA: Diagnosis not present

## 2016-06-16 DIAGNOSIS — G459 Transient cerebral ischemic attack, unspecified: Secondary | ICD-10-CM | POA: Diagnosis not present

## 2016-06-16 DIAGNOSIS — Z23 Encounter for immunization: Secondary | ICD-10-CM | POA: Diagnosis not present

## 2016-06-16 DIAGNOSIS — Z135 Encounter for screening for eye and ear disorders: Secondary | ICD-10-CM | POA: Diagnosis not present

## 2016-06-16 MED ORDER — ZOSTER VACCINE LIVE 19400 UNT/0.65ML ~~LOC~~ SUSR: 0.6500 mL | Freq: Once | SUBCUTANEOUS | 0 refills | Status: AC

## 2016-06-16 NOTE — Patient Instructions (Addendum)
It was nice to meet you today!  I'm glad your TIA symptoms have resolved. Checking ultrasounds of heart (echocardiogram) and neck (carotid dopplers).  See prescription for zostavax - this is a vaccine to prevent shingles.  Also recommend you get a diabetic eye exam. Have them send records of these to [place] when they're done.  Follow up with Dr. Andria Frames in 1-2 months, sooner if needed.  Be well, Dr. Ardelia Mems   Smoking Cessation, Tips for Success If you are ready to quit smoking, congratulations! You have chosen to help yourself be healthier. Cigarettes bring nicotine, tar, carbon monoxide, and other irritants into your body. Your lungs, heart, and blood vessels will be able to work better without these poisons. There are many different ways to quit smoking. Nicotine gum, nicotine patches, a nicotine inhaler, or nicotine nasal spray can help with physical craving. Hypnosis, support groups, and medicines help break the habit of smoking. WHAT THINGS CAN I DO TO MAKE QUITTING EASIER?  Here are some tips to help you quit for good:  Pick a date when you will quit smoking completely. Tell all of your friends and family about your plan to quit on that date.  Do not try to slowly cut down on the number of cigarettes you are smoking. Pick a quit date and quit smoking completely starting on that day.  Throw away all cigarettes.   Clean and remove all ashtrays from your home, work, and car.  On a card, write down your reasons for quitting. Carry the card with you and read it when you get the urge to smoke.  Cleanse your body of nicotine. Drink enough water and fluids to keep your urine clear or pale yellow. Do this after quitting to flush the nicotine from your body.  Learn to predict your moods. Do not let a bad situation be your excuse to have a cigarette. Some situations in your life might tempt you into wanting a cigarette.  Never have "just one" cigarette. It leads to wanting another and  another. Remind yourself of your decision to quit.  Change habits associated with smoking. If you smoked while driving or when feeling stressed, try other activities to replace smoking. Stand up when drinking your coffee. Brush your teeth after eating. Sit in a different chair when you read the paper. Avoid alcohol while trying to quit, and try to drink fewer caffeinated beverages. Alcohol and caffeine may urge you to smoke.  Avoid foods and drinks that can trigger a desire to smoke, such as sugary or spicy foods and alcohol.  Ask people who smoke not to smoke around you.  Have something planned to do right after eating or having a cup of coffee. For example, plan to take a walk or exercise.  Try a relaxation exercise to calm you down and decrease your stress. Remember, you may be tense and nervous for the first 2 weeks after you quit, but this will pass.  Find new activities to keep your hands busy. Play with a pen, coin, or rubber band. Doodle or draw things on paper.  Brush your teeth right after eating. This will help cut down on the craving for the taste of tobacco after meals. You can also try mouthwash.   Use oral substitutes in place of cigarettes. Try using lemon drops, carrots, cinnamon sticks, or chewing gum. Keep them handy so they are available when you have the urge to smoke.  When you have the urge to smoke, try deep breathing.  Designate your home as a nonsmoking area.  If you are a heavy smoker, ask your health care provider about a prescription for nicotine chewing gum. It can ease your withdrawal from nicotine.  Reward yourself. Set aside the cigarette money you save and buy yourself something nice.  Look for support from others. Join a support group or smoking cessation program. Ask someone at home or at work to help you with your plan to quit smoking.  Always ask yourself, "Do I need this cigarette or is this just a reflex?" Tell yourself, "Today, I choose not to  smoke," or "I do not want to smoke." You are reminding yourself of your decision to quit.  Do not replace cigarette smoking with electronic cigarettes (commonly called e-cigarettes). The safety of e-cigarettes is unknown, and some may contain harmful chemicals.  If you relapse, do not give up! Plan ahead and think about what you will do the next time you get the urge to smoke. HOW WILL I FEEL WHEN I QUIT SMOKING? You may have symptoms of withdrawal because your body is used to nicotine (the addictive substance in cigarettes). You may crave cigarettes, be irritable, feel very hungry, cough often, get headaches, or have difficulty concentrating. The withdrawal symptoms are only temporary. They are strongest when you first quit but will go away within 10-14 days. When withdrawal symptoms occur, stay in control. Think about your reasons for quitting. Remind yourself that these are signs that your body is healing and getting used to being without cigarettes. Remember that withdrawal symptoms are easier to treat than the major diseases that smoking can cause.  Even after the withdrawal is over, expect periodic urges to smoke. However, these cravings are generally short lived and will go away whether you smoke or not. Do not smoke! WHAT RESOURCES ARE AVAILABLE TO HELP ME QUIT SMOKING? Your health care provider can direct you to community resources or hospitals for support, which may include:  Group support.  Education.  Hypnosis.  Therapy.   This information is not intended to replace advice given to you by your health care provider. Make sure you discuss any questions you have with your health care provider.   Document Released: 07/22/2004 Document Revised: 11/14/2014 Document Reviewed: 04/11/2013 Elsevier Interactive Patient Education [employer].

## 2016-06-16 NOTE — Progress Notes (Signed)
  HPI:  Danielle [name] presents for hospital follow up. Patient was hospitalized from 06/13/16 to 06/14/16 with acute onset left facial droop and dysarthria. Admitted for TIA workup. MRI/MRA was negative. Lipids normal. Started on aspirin and plavix prior to discharge. A1c was pending at discharge, but resulted at 6.5.   Patient now reports she's doing well. No recurrence of symptoms (lasted only several minutes prior to admission). She is adherent with medications.  We did discuss smoking cessation. Currently smokes about 1/2 ppd. Willing to quit. Has successfully quit in the past, for 1 year, just via quitting cold [place].  ROS: See HPI.  [place]: history of hypertension, TIA, osteopenia, hyopthyroidism, type 2 diabetes, spinal stenosis, hyperlipidemia, GERD, depression, obesity,   PHYSICAL EXAM: BP 119/85   Pulse 97   Temp 98.4 F (36.9 C) (Oral)   Ht 5\' 5"  (1.651 m)   Wt 177 lb 3.2 oz (80.4 kg)   BMI 29.49 kg/m  Gen: NAD, pleasant, cooperative HEENT: normocephalic, atraumatic, moist mucous membranes  Heart: regular rate and rhythm, no murmur Lungs: clear to auscultation bilaterally, normal work of breathing  Neuro: symmetric face, speech normal, gait normal, nonfocal grossly Ext: No appreciable lower extremity edema bilaterally   ASSESSMENT/PLAN:  TIA (transient ischemic attack) Recovering well. Will update echo and carotid dopplers as these weren't done in the hospital.  Biggest modifiable risk factor is tobacco abuse, also weight. Discussed imperative need for smoking cessation with patient today. She is motivated to quit. Handout given on tips for quitting, also info about 1800-quit-now  Tobacco abuse Motivated to quit. Gave handouts & hotline number Encouraged cessation - especially in light of this TIA.  Health maintenance: -given rx for zostavax -refer for diabetic eye exam (does not have eye doc)  FOLLOW UP: Follow up in 1-2 months with PCP for chronic medical  issues Referring to ophtho for diabetic eye exam  [place] J. Ardelia Mems, [place]

## 2016-06-18 NOTE — Assessment & Plan Note (Signed)
Motivated to quit. Gave handouts & hotline number Encouraged cessation - especially in light of this TIA.

## 2016-06-18 NOTE — Assessment & Plan Note (Signed)
Recovering well. Will update echo and carotid dopplers as these weren't done in the hospital.  Biggest modifiable risk factor is tobacco abuse, also weight. Discussed imperative need for smoking cessation with patient today. She is motivated to quit. Handout given on tips for quitting, also info about 1800-quit-now

## 2016-06-23 ENCOUNTER — Ambulatory Visit (INDEPENDENT_AMBULATORY_CARE_PROVIDER_SITE_OTHER): Payer: Medicare Other | Admitting: Neurology

## 2016-06-23 ENCOUNTER — Encounter: Payer: Self-pay | Admitting: Neurology

## 2016-06-23 VITALS — BP 140/90 | HR 86 | Ht 65.0 in | Wt 179.5 lb

## 2016-06-23 DIAGNOSIS — R202 Paresthesia of skin: Secondary | ICD-10-CM | POA: Diagnosis not present

## 2016-06-23 NOTE — Progress Notes (Signed)
Reason for visit: Paresthesias  Referring physician: Dr. Luanne Bras is a 60 y.o. female  History of present illness:  Danielle [name] is a 60 year old right-handed white female with a history of multiple lumbosacral spine procedures, the last procedure she claims was in 1995. The patient has had fusion from the L2 level through the S1 level. The patient has residual chronic low back pain. The patient has chronic issues with paresthesias in the lateral distribution of the right leg below the knee to the foot. She has discomfort in both big toes. She has issues with pain and some paresthesias into the right hip going into the right groin area. The patient is getting occasional SI joint injections through Dr. Laurence Spates which offer benefit for her right leg discomfort lasting up to one month. The patient denies any weakness of the lower extremities. There has been some worsening of her right leg discomfort and paresthesias that started on 05/30/2016. The patient denies issues controlling the bowels or the bladder, she denies any balance issues. The mobility of her low back has been limited. She also reports a history of plantar fasciitis bilaterally, surgery was recommended but she did not have surgery. The patient is sent to this office for further evaluation.   The patient was just recently in the hospital for evaluation of a TIA event involving left facial droop and slurred speech. The MRI did not show an acute stroke, MRA of the head and neck were unremarkable. The patient is on aspirin and Plavix. This event occurred on 06/13/2016.  Past Medical History:  Diagnosis Date  . 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 Cervical spondylosis without myelopathy 09/12/2013  . Complication of anesthesia   . Diabetes mellitus without complication (HCC)    diet controlled -no meds  . Diverticulitis   . Dyslipidemia   . Family history of anesthesia complication    Mother - N/V  . Gastroesophageal  reflux disease    8./25/15- no problem  . Glucose intolerance (pre-diabetes)   . Heart murmur    nothing to be concerned  . History of blood transfusion   . History of hiatal hernia   . History of tobacco abuse   . Hypertension   . Hypothyroidism   . Kidney stone   . Obesity   . Oral thrush    after back surgeries  . PONV (postoperative nausea and vomiting)   . Stroke [facility])    TIA X 2-3; last in 2013  . Tachycardia     Past Surgical History:  Procedure Laterality Date  . ABDOMINAL HYSTERECTOMY    . APPENDECTOMY    . BACK SURGERY     10 spine surgeries since 1988.  Marland Kitchen CARPAL TUNNEL RELEASE Right 04/07/2015   Procedure: RIGHT CARPAL TUNNEL RELEASE;  Surgeon: Daryll Brod, MD;  Location: [place];  Service: Orthopedics;  Laterality: Right;  . CARPAL TUNNEL RELEASE Left 09/29/2015   Procedure: CARPAL TUNNEL RELEASE LEFT ;  Surgeon: Daryll Brod, MD;  Location: [place];  Service: Orthopedics;  Laterality: Left;  . CHOLECYSTECTOMY    . CYSTOSCOPY W/ STONE MANIPULATION    . CYSTOSCOPY W/ URETERAL STENT PLACEMENT    . POSTERIOR CERVICAL FUSION/FORAMINOTOMY N/A 07/01/2014   Procedure: RIGHT C5-6 and C6-7 FORAMINOTOMY;  Surgeon: Jessy Oto, MD;  Location: [place];  Service: Orthopedics;  Laterality: N/A;  . TONSILLECTOMY    . ULNAR NERVE TRANSPOSITION Right 04/07/2015  Procedure: DECOMPRESSION  ULNAR NERVE RIGHT ELBOW;  Surgeon: Daryll Brod, MD;  Location: [place];  Service: Orthopedics;  Laterality: Right;  . ULNAR NERVE TRANSPOSITION Left 09/29/2015   Procedure: DECOMPRESSION  ULNAR NERVE LEFT ELBOW;  Surgeon: Daryll Brod, MD;  Location: [place];  Service: Orthopedics;  Laterality: Left;    Family History  Problem Relation Age of Onset  . Heart attack Father   . Cancer Father   . Diabetes Father   . Stroke Mother   . Diabetes Mother   . Cancer Mother   . Heart attack Brother   . Cancer Other   . Coronary artery  disease Other   . Stroke Other   . Diabetes Other   . Hypertension Other   . Heart failure Other     Social history:  reports that she has been smoking Cigarettes.  She has a 21.50 pack-year smoking history. She has never used smokeless tobacco. She reports that she does not drink alcohol or use drugs.  Medications:  Prior to Admission medications   Medication Sig Start Date End Date Taking? Authorizing Provider  aspirin EC 81 MG tablet Take 81 mg by mouth daily.    Yes Historical Provider, MD  atorvastatin (LIPITOR) 80 MG tablet Take 1 tablet (80 mg total) by mouth daily at 6 PM. 06/14/16  Yes Smiley Houseman, MD  clopidogrel (PLAVIX) 75 MG tablet Take 1 tablet (75 mg total) by mouth daily. 06/15/16  Yes Smiley Houseman, MD  ibuprofen (ADVIL,MOTRIN) 800 MG tablet Take 800 mg by mouth every 8 (eight) hours as needed for pain. 04/01/16  Yes Historical Provider, MD  levothyroxine (SYNTHROID, LEVOTHROID) 200 MCG tablet TAKE 1 TABLET BY MOUTH EVERY DAY BEFORE BREAKFAST 09/14/15  Yes [name], MD  metoprolol succinate (TOPROL-XL) 100 MG 24 hr tablet Take 2 tablets (200 mg total) by mouth daily. Take with or immediately following a meal. Patient taking differently: Take 100 mg by mouth 2 (two) times daily. Take with or immediately following a meal. 02/23/16  Yes [name], MD  nitroGLYCERIN (NITROSTAT) 0.4 MG SL tablet Place 1 tablet (0.4 mg total) under the tongue every 5 (five) minutes as needed for chest pain. 02/23/16  Yes [name], MD      Allergies  Allergen Reactions  . Ivp Dye [Iodinated Diagnostic Agents] Anaphylaxis, Shortness Of Breath and Swelling    States had throat swelling, "couldn't breathe", states anaphylaxis. Also states has been given IVP dye since reaction without difficulty.   . Cephalexin Other (See Comments)    burns stomach  . Codeine Hives and Nausea And Vomiting    ROS:  Out of a complete 14 system review of symptoms, the patient  complains only of the following symptoms, and all other reviewed systems are negative.  Palpitations of the heart Excessive thirst Neck stiffness  Blood pressure 140/90, pulse 86, height 5\' 5"  (1.651 m), weight 179 lb 8 oz (81.4 kg).  Physical Exam  General: The patient is alert and cooperative at the time of the examination. The patient is moderately obese.  Eyes: Pupils are equal, round, and reactive to light. Discs are flat bilaterally.  Neck: The neck is supple, no carotid bruits are noted.  Respiratory: The respiratory examination is clear.  Cardiovascular: The cardiovascular examination reveals a regular rate and rhythm, with a grade II/VI systolic ejection murmur at the aortic area.  Skin: Extremities are without significant edema.  Neurologic Exam  Mental status: The patient is alert and oriented x 3 at the time of the examination. The patient has apparent normal recent and remote memory, with an apparently normal attention span and concentration ability.  Cranial nerves: Facial symmetry is present. There is good sensation of the face to pinprick and soft touch bilaterally. The strength of the facial muscles and the muscles to head turning and shoulder shrug are normal bilaterally. Speech is well enunciated, no aphasia or dysarthria is noted. Extraocular movements are full. Visual fields are full. The tongue is midline, and the patient has symmetric elevation of the soft palate. No obvious hearing deficits are noted.  Motor: The motor testing reveals 5 over 5 strength of all 4 extremities. Good symmetric motor tone is noted throughout.  Sensory: Sensory testing is intact to pinprick, soft touch, vibration sensation, and position sense on all 4 extremities, with exception of a stocking pattern pinprick sensory deficit in the distal third of the legs bilaterally. No evidence of extinction is noted.  Coordination: Cerebellar testing reveals good finger-nose-finger and  heel-to-shin bilaterally.  Gait and station: Gait is normal. Tandem gait is normal. Romberg is negative. No drift is seen.  Reflexes: Deep tendon reflexes are symmetric, the ankle jerk reflexes are maintained, the knee jerk reflexes are depressed bilaterally. Toes are downgoing bilaterally.   Assessment/Plan:  1. Chronic low back pain  2. Bilateral lower extremity paresthesias, right greater than left  The patient has had multiple lumbar spine procedures. The patient has some residual chronic pain, and some paresthesias in the right leg that simulate the L5 distribution. The patient is on minimal medications for pain, she seemed to be tolerating these symptoms relatively well. She claims that the SI joint injections help her right leg symptoms. The patient will be set up for nerve conduction studies of both legs, EMG on the right leg. I do not believe that the patient has an underlying peripheral neuropathy.  Jill Alexanders MD 06/23/2016 8:58 AM  [facility] [address] [place], [place] [address]  Phone [phone] Fax [phone]

## 2016-06-23 NOTE — Patient Instructions (Signed)
   We will check EMG of the right leg and NCV of both legs.

## 2016-06-28 ENCOUNTER — Encounter: Payer: Self-pay | Admitting: Neurology

## 2016-06-28 ENCOUNTER — Ambulatory Visit (INDEPENDENT_AMBULATORY_CARE_PROVIDER_SITE_OTHER): Payer: Self-pay | Admitting: Neurology

## 2016-06-28 ENCOUNTER — Ambulatory Visit (INDEPENDENT_AMBULATORY_CARE_PROVIDER_SITE_OTHER): Payer: Medicare Other | Admitting: Neurology

## 2016-06-28 DIAGNOSIS — R202 Paresthesia of skin: Secondary | ICD-10-CM

## 2016-06-28 DIAGNOSIS — M4802 Spinal stenosis, cervical region: Secondary | ICD-10-CM

## 2016-06-28 NOTE — Progress Notes (Signed)
Please for 2 EMG and nerve conduction study procedure note.

## 2016-06-28 NOTE — Procedures (Signed)
     HISTORY:  Danielle [name] is a 60 year old patient with a history of prior lumbosacral spine surgery who reports some discomfort in the big toes of both feet, right greater than left, with right hip and right groin discomfort. The patient is being evaluated for a possible peripheral neuropathy versus a lumbosacral radiculopathy.  NERVE CONDUCTION STUDIES:  Nerve conduction studies were performed on both lower extremities. The distal motor latencies and motor amplitudes for the peroneal and posterior tibial nerves were within normal limits. The nerve conduction velocities for these nerves were also normal. The H reflex latencies were normal. The sensory latencies for the peroneal nerves were within normal limits.   EMG STUDIES:  EMG study was performed on the right lower extremity:  The tibialis anterior muscle reveals 2 to 4K motor units with full recruitment. No fibrillations or positive waves were seen. The peroneus tertius muscle reveals 2 to 4K motor units with full recruitment. No fibrillations or positive waves were seen. The medial gastrocnemius muscle reveals 1 to 3K motor units with full recruitment. No fibrillations or positive waves were seen. The vastus lateralis muscle reveals 2 to 4K motor units with full recruitment. No fibrillations or positive waves were seen. The iliopsoas muscle reveals 2 to 4K motor units with full recruitment. No fibrillations or positive waves were seen. The biceps femoris muscle (long head) reveals 2 to 4K motor units with full recruitment. No fibrillations or positive waves were seen. The lumbosacral paraspinal muscles were tested at 3 levels, and revealed no abnormalities of insertional activity at all 3 levels tested. There was good relaxation.   IMPRESSION:  Nerve conduction studies done on both lower extremities were within normal limits. No evidence of a peripheral neuropathy is seen. EMG evaluation of the right lower extremity was  unremarkable, without evidence of an overlying lumbosacral radiculopathy.  Jill Alexanders MD 06/28/2016 1:51 PM  [facility] [address] [place], [place] [address]  Phone [phone] Fax [phone]

## 2016-07-26 ENCOUNTER — Telehealth: Payer: Self-pay

## 2016-07-26 ENCOUNTER — Other Ambulatory Visit: Payer: Self-pay | Admitting: Family Medicine

## 2016-07-26 DIAGNOSIS — G459 Transient cerebral ischemic attack, unspecified: Secondary | ICD-10-CM

## 2016-07-26 NOTE — Telephone Encounter (Signed)
Called pt to inform her that the [place] and Echo are scheduled for 08/04/2016.  The [place] is at 68 and the Echo is at 77. She will be getting a call from precert this week. Ottis Stain, CMA

## 2016-07-26 NOTE — Telephone Encounter (Signed)
Pt informed of [place] and Echo appointments. Ottis Stain, CMA

## 2016-07-26 NOTE — Progress Notes (Signed)
Ottis Stain, CMA  Leeanne Rio, MD        Will you change the [place] order to [id number]. The Echo is scheduled for next week.  Thank you   Previous Messages    ----- Message -----  From: Leeanne Rio, MD  Sent: 06/18/2016 10:48 AM  To: [facility]   Can you make sure echo & carotid doppler are scheduled & call patient with appointment?   Thanks!  [place] updated Leeanne Rio, MD

## 2016-08-04 ENCOUNTER — Ambulatory Visit (HOSPITAL_BASED_OUTPATIENT_CLINIC_OR_DEPARTMENT_OTHER)
Admission: RE | Admit: 2016-08-04 | Discharge: 2016-08-04 | Disposition: A | Discharge status: Home / Self Care | Payer: Medicare Other | Source: Ambulatory Visit | Attending: Family Medicine | Admitting: Family Medicine

## 2016-08-04 ENCOUNTER — Ambulatory Visit (HOSPITAL_COMMUNITY)
Admission: RE | Admit: 2016-08-04 | Discharge: 2016-08-04 | Disposition: A | Discharge status: Home / Self Care | Payer: Medicare Other | Source: Ambulatory Visit | Attending: Family Medicine | Admitting: Family Medicine

## 2016-08-04 DIAGNOSIS — G459 Transient cerebral ischemic attack, unspecified: Secondary | ICD-10-CM

## 2016-08-04 LAB — VAS US CAROTID
LCCADSYS: -55 cm/s
LCCAPDIAS: 17 cm/s
LEFT ECA DIAS: -15 cm/s
LEFT VERTEBRAL DIAS: 17 cm/s
LICAPDIAS: -19 cm/s
Left CCA dist dias: -21 cm/s
Left CCA prox sys: 83 cm/s
Left ICA dist dias: -31 cm/s
Left ICA dist sys: -80 cm/s
Left ICA prox sys: -54 cm/s
RCCADSYS: -53 cm/s
RCCAPDIAS: 16 cm/s
RCCAPSYS: 78 cm/s
RIGHT ECA DIAS: 12 cm/s
RIGHT VERTEBRAL DIAS: 21 cm/s

## 2016-08-04 NOTE — Progress Notes (Signed)
  Echocardiogram 2D Echocardiogram has been performed.  Tresa Res 08/04/2016, 11:40 AM

## 2016-08-04 NOTE — Progress Notes (Signed)
*  PRELIMINARY RESULTS* Vascular Ultrasound Carotid Duplex (Doppler) has been completed.  Preliminary findings: Bilateral: No significant (1-39%) ICA stenosis. Antegrade vertebral flow.   Landry Mellow, RDMS, RVT  08/04/2016, 10:05 AM

## 2016-08-11 ENCOUNTER — Ambulatory Visit (INDEPENDENT_AMBULATORY_CARE_PROVIDER_SITE_OTHER): Payer: Medicare Other | Admitting: Specialist

## 2016-08-15 ENCOUNTER — Encounter: Payer: Self-pay | Admitting: Family Medicine

## 2016-08-16 ENCOUNTER — Other Ambulatory Visit: Payer: Self-pay | Admitting: Family Medicine

## 2016-08-16 ENCOUNTER — Other Ambulatory Visit: Payer: Self-pay | Admitting: Internal Medicine

## 2016-08-16 DIAGNOSIS — R079 Chest pain, unspecified: Secondary | ICD-10-CM

## 2016-09-07 ENCOUNTER — Encounter: Payer: Medicare Other | Admitting: Neurology

## 2016-09-09 ENCOUNTER — Ambulatory Visit (INDEPENDENT_AMBULATORY_CARE_PROVIDER_SITE_OTHER): Payer: Medicare Other | Admitting: Specialist

## 2016-09-09 ENCOUNTER — Encounter (INDEPENDENT_AMBULATORY_CARE_PROVIDER_SITE_OTHER): Payer: Self-pay | Admitting: Specialist

## 2016-09-09 VITALS — BP 131/98 | HR 98 | Ht 65.0 in | Wt 182.0 lb

## 2016-09-09 DIAGNOSIS — M25551 Pain in right hip: Secondary | ICD-10-CM

## 2016-09-09 DIAGNOSIS — M5441 Lumbago with sciatica, right side: Secondary | ICD-10-CM | POA: Diagnosis not present

## 2016-09-09 DIAGNOSIS — G8929 Other chronic pain: Secondary | ICD-10-CM

## 2016-09-09 NOTE — Progress Notes (Signed)
Office Visit Note   Patient: Danielle [name]           Date of Birth: [birth year]           MRN: [id number] Visit Date: 09/09/2016              Requested by: [name], MD [address] [place], [place] [address] PCP: Zigmund Gottron, MD   Assessment & Plan: Visit Diagnoses:  1. Chronic midline low back pain with right-sided sciatica   2. Pain of right hip joint     Plan:Avoid frequent bending and stooping. No lifting greater than 10 lbs. Avoid twisting and prolong siitting and driving. Alternate sitting, standing and walking.   Follow-Up Instructions: Return in about 3 months (around 12/10/2016) for CHeck hips and lumbar spine.   Orders:  No orders of the defined types were placed in this encounter.  No orders of the defined types were placed in this encounter.     Procedures: No procedures performed   Clinical Data: No additional findings.   Subjective: Chief Complaint  Patient presents with  . Right Hip - Pain, Follow-up    Patient is returning follow up from right hip injection that was done on 07/27/2016 with Dr. Ernestina Patches. Patient states injection helped at least 50%. States she has better ROM with her right hip/leg.  The pain in the right hip is starting to return, has seen Dr. Jannifer Franklin and was told that there is a back cause for the back and leg discomfort. Injection of the right hip brought about immediate relief of  Pain and she was able to lift the right hip immediately following the injection. Complains of burning and stinging right buttock and some times with standing feels a catching sensation in the right side. Pain is still improved nearly  1 month following the cortisone shot in the right hip.    Review of Systems  HENT: Negative.   Eyes: Negative.   Respiratory: Negative.   Cardiovascular: Negative.   Gastrointestinal: Negative.   Endocrine: Negative.   Genitourinary: Negative.   Musculoskeletal: Positive for back pain, gait  problem and joint swelling.  Skin: Negative.   Allergic/Immunologic: Negative.   Neurological: Positive for weakness and numbness.  Hematological: Negative.   Psychiatric/Behavioral: Negative for agitation, behavioral problems, confusion, decreased concentration, dysphoric mood, hallucinations, self-injury, sleep disturbance and suicidal ideas. The patient is not nervous/anxious and is not hyperactive.      Objective: Vital Signs: BP (!) 131/98   Pulse 98   Ht 5\' 5"  (1.651 m)   Wt 182 lb (82.6 kg)   BMI 30.29 kg/m   Physical Exam  Constitutional: She is oriented to person, place, and time. She appears well-developed and well-nourished.  HENT:  Head: Normocephalic and atraumatic.  Eyes: EOM are normal. Pupils are equal, round, and reactive to light.  Neck: Normal range of motion. Neck supple.  Pulmonary/Chest: Effort normal and breath sounds normal.  Abdominal: Soft. Bowel sounds are normal.  Musculoskeletal: Normal range of motion. She exhibits tenderness.  Neurological: She is alert and oriented to person, place, and time. She displays normal reflexes. No cranial nerve deficit. She exhibits normal muscle tone. Coordination normal.  Skin: Skin is warm.  Psychiatric: She has a normal mood and affect. Her behavior is normal. Judgment and thought content normal.    Right Hip Exam   Tenderness  The patient is experiencing no tenderness.     Range of Motion  Extension: normal  Flexion: normal  Internal Rotation: normal  External Rotation: normal  Abduction: normal  Adduction: normal   Muscle Strength  Abduction: 5/5  Adduction: 5/5  Flexion: 5/5   Tests  FABER: negative Ober: negative  Other  Erythema: absent Scars: absent Sensation: normal Pulse: present   Left Hip Exam  Left hip exam is normal.  Range of Motion  Extension: normal  Flexion: normal  Internal Rotation: normal  External Rotation: normal  Abduction: normal  Adduction: normal   Muscle  Strength  Abduction: 5/5  Adduction: 5/5  Flexion: 5/5   Tests  FABER: negative Ober: negative  Other  Erythema: absent Scars: absent Sensation: normal Pulse: present      Specialty Comments:  No specialty comments available.  Imaging: No results found.   PMFS History: Patient Active Problem List   Diagnosis Date Noted  . Chest pain 02/23/2016  . Disorder of bone 01/12/2016  . Abdominal pain, epigastric 12/10/2014  . Spinal stenosis in cervical region 07/01/2014  . Osteopenia 01/22/2014  . Pain in joint, ankle and foot 12/27/2013  . Diverticulitis large intestine w/o perforation or abscess w/o bleeding 07/25/2013  . TIA (transient ischemic attack) 04/20/2011  . DEPRESSION 03/12/2010  . OBESITY 10/08/2009  . CYST AND PSEUDOCYST OF PANCREAS 09/21/2009  . HYPERCHOLESTEROLEMIA 12/10/2008  . ROTATOR CUFF SYNDROME 12/10/2008  . COLONIC POLYPS, HYPERPLASTIC 05/02/2008  . Essential hypertension 09/17/2007  . Palpitations 09/17/2007  . Diabetes mellitus type 2, controlled, without complications ([place]) [id number]  . Hypothyroidism 01/04/2007  . HYPERCALCEMIA 01/04/2007  . Tobacco abuse 01/04/2007  . CARPAL TUNNEL SYNDROME 01/04/2007  . GASTROESOPHAGEAL REFLUX, NO ESOPHAGITIS 01/04/2007  . CERVICAL SPINE DISORDER, NOS 01/04/2007  . BACK PAIN W/RADIATION, UNSPECIFIED 01/04/2007   Past Medical History:  Diagnosis Date  . 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 Cervical spondylosis without myelopathy 09/12/2013  . Complication of anesthesia   . Diabetes mellitus without complication (HCC)    diet controlled -no meds  . Diverticulitis   . Dyslipidemia   . Family history of anesthesia complication    Mother - N/V  . Gastroesophageal reflux disease    8./25/15- no problem  . Glucose intolerance (pre-diabetes)   . Heart murmur    nothing to be concerned  . History of blood transfusion   . History of hiatal hernia   . History of tobacco abuse   . Hypertension   .  Hypothyroidism   . Kidney stone   . Obesity   . Oral thrush    after back surgeries  . PONV (postoperative nausea and vomiting)   . Stroke [facility])    TIA X 2-3; last in 2013  . Tachycardia     Family History  Problem Relation Age of Onset  . Heart attack Father   . Cancer Father   . Diabetes Father   . Stroke Mother   . Diabetes Mother   . Cancer Mother   . Heart attack Brother   . Cancer Other   . Coronary artery disease Other   . Stroke Other   . Diabetes Other   . Hypertension Other   . Heart failure Other     Past Surgical History:  Procedure Laterality Date  . ABDOMINAL HYSTERECTOMY    . APPENDECTOMY    . BACK SURGERY     10 spine surgeries since 1988.  Marland Kitchen CARPAL TUNNEL RELEASE Right 04/07/2015   Procedure: RIGHT CARPAL TUNNEL RELEASE;  Surgeon: Daryll Brod, MD;  Location: [place];  Service: Orthopedics;  Laterality: Right;  . CARPAL TUNNEL RELEASE Left 09/29/2015   Procedure: CARPAL TUNNEL RELEASE LEFT ;  Surgeon: Daryll Brod, MD;  Location: [place];  Service: Orthopedics;  Laterality: Left;  . CHOLECYSTECTOMY    . CYSTOSCOPY W/ STONE MANIPULATION    . CYSTOSCOPY W/ URETERAL STENT PLACEMENT    . POSTERIOR CERVICAL FUSION/FORAMINOTOMY N/A 07/01/2014   Procedure: RIGHT C5-6 and C6-7 FORAMINOTOMY;  Surgeon: Jessy Oto, MD;  Location: [place];  Service: Orthopedics;  Laterality: N/A;  . TONSILLECTOMY    . ULNAR NERVE TRANSPOSITION Right 04/07/2015   Procedure: DECOMPRESSION  ULNAR NERVE RIGHT ELBOW;  Surgeon: Daryll Brod, MD;  Location: [place];  Service: Orthopedics;  Laterality: Right;  . ULNAR NERVE TRANSPOSITION Left 09/29/2015   Procedure: DECOMPRESSION  ULNAR NERVE LEFT ELBOW;  Surgeon: Daryll Brod, MD;  Location: [place];  Service: Orthopedics;  Laterality: Left;   Social History   Occupational History  . Retired Cabin crew    Social History Main Topics  . Smoking status: Current  Every Day Smoker    Packs/day: 0.50    Years: 43.00    Types: Cigarettes  . Smokeless tobacco: Never Used     Comment: quit early 02/2016  . Alcohol use No  . Drug use: No  . Sexual activity: Not Currently

## 2016-09-09 NOTE — Patient Instructions (Signed)
Avoid frequent bending and stooping. No lifting greater than 10 lbs. Avoid twisting and prolong siitting and driving. Alternate sitting, standing and walking.

## 2016-09-21 ENCOUNTER — Other Ambulatory Visit: Payer: Self-pay | Admitting: Family Medicine

## 2016-09-21 ENCOUNTER — Ambulatory Visit: Payer: Medicare Other | Admitting: Family Medicine

## 2016-09-28 ENCOUNTER — Ambulatory Visit: Payer: Medicare Other | Admitting: Family Medicine

## 2016-10-05 ENCOUNTER — Ambulatory Visit: Payer: Medicare Other | Admitting: Family Medicine

## 2016-12-09 ENCOUNTER — Ambulatory Visit (INDEPENDENT_AMBULATORY_CARE_PROVIDER_SITE_OTHER): Payer: Medicare Other

## 2016-12-09 ENCOUNTER — Encounter (INDEPENDENT_AMBULATORY_CARE_PROVIDER_SITE_OTHER): Payer: Self-pay | Admitting: Specialist

## 2016-12-09 ENCOUNTER — Ambulatory Visit (INDEPENDENT_AMBULATORY_CARE_PROVIDER_SITE_OTHER): Payer: Medicare Other | Admitting: Specialist

## 2016-12-09 ENCOUNTER — Encounter (INDEPENDENT_AMBULATORY_CARE_PROVIDER_SITE_OTHER): Payer: Self-pay

## 2016-12-09 VITALS — BP 134/89 | HR 88 | Ht 65.0 in | Wt 182.0 lb

## 2016-12-09 DIAGNOSIS — M25551 Pain in right hip: Secondary | ICD-10-CM

## 2016-12-09 DIAGNOSIS — M533 Sacrococcygeal disorders, not elsewhere classified: Secondary | ICD-10-CM | POA: Diagnosis not present

## 2016-12-09 DIAGNOSIS — G8929 Other chronic pain: Secondary | ICD-10-CM

## 2016-12-09 DIAGNOSIS — M1611 Unilateral primary osteoarthritis, right hip: Secondary | ICD-10-CM

## 2016-12-09 DIAGNOSIS — M7551 Bursitis of right shoulder: Secondary | ICD-10-CM | POA: Diagnosis not present

## 2016-12-09 MED ORDER — METHYLPREDNISOLONE ACETATE 40 MG/ML IJ SUSP
40.0000 mg | INTRAMUSCULAR | Status: AC | PRN
  Administered 2016-12-09: 40 mg via INTRA_ARTICULAR

## 2016-12-09 MED ORDER — BUPIVACAINE HCL 0.25 % IJ SOLN
4.0000 mL | INTRAMUSCULAR | Status: AC | PRN
  Administered 2016-12-09: 4 mL via INTRA_ARTICULAR

## 2016-12-09 NOTE — Progress Notes (Signed)
Office Visit Note   Patient: Danielle [name]           Date of Birth: 09-Apr-1956           MRN: [id number] Visit Date: 12/09/2016              Requested by: Danielle Resides, MD [address] [place], [place] [address] PCP: Zigmund Gottron, MD   Assessment & Plan: Visit Diagnoses:  1. Bursitis of right shoulder   2. Chronic right sacroiliac pain   3. Pain in right hip   4. Unilateral primary osteoarthritis, right hip     Plan: The right hip and Right SI joint are suffering from osteoarthritis, only real proven treatments are Weight loss, NSIADs like diclofenac and exercise. Well padded shoes help. Ice the posterior hip 2-3 times a day 15-20 mins at a time. Use a cane in the left hand for ambulation Dr. Romona Curls secretary will call to schedule a further right hip intraarticular injection with steroid and marcaine.    Follow-Up Instructions: No Follow-up on file.   Orders:  Orders Placed This Encounter  Procedures  . Large Joint Injection/Arthrocentesis  . XR HIP UNILAT W OR W/O PELVIS 2-3 VIEWS RIGHT  . Ambulatory referral to Physical Medicine Rehab   No orders of the defined types were placed in this encounter.     Procedures: Large Joint Inj Date/Time: 12/09/2016 8:59 AM Performed by: Jessy Oto Authorized by: Jessy Oto   Consent Given by:  Patient Site marked: the procedure site was marked   Timeout: prior to procedure the correct patient, procedure, and site was verified   Indications:  Pain and joint swelling Location:  Shoulder Site:  R subacromial bursa Prep: patient was prepped and draped in usual sterile fashion   Needle Size:  25 G Needle Length:  1.5 inches Approach:  Posterior Ultrasound Guidance: No   Fluoroscopic Guidance: No   Arthrogram: No   Medications:  4 mL bupivacaine 0.25 %; 40 mg methylPREDNISolone acetate 40 MG/ML Aspiration Attempted: No   Patient tolerance:  Patient tolerated the procedure well with no  immediate complications  Bandaid applied.      Clinical Data: No additional findings.   Subjective: Chief Complaint  Patient presents with  . Lower Back - Pain, Follow-up  . Right Hip - Pain, Follow-up  . Left Hip - Pain, Follow-up    Danielle [name] is here to follow up on her back and bilateral hip pain. She states that her back is terrible, that she's not any better. She also states that her right hip is giving her a fit.    Review of Systems  Constitutional: Negative.   HENT: Negative.   Eyes: Negative.   Respiratory: Negative.   Cardiovascular: Negative.   Gastrointestinal: Positive for abdominal distention, abdominal pain, diarrhea, nausea and vomiting.  Endocrine: Negative.   Genitourinary: Negative.   Musculoskeletal: Negative.   Skin: Negative.   Allergic/Immunologic: Negative.   Neurological: Negative.   Hematological: Negative.   Psychiatric/Behavioral: Negative.      Objective: Vital Signs: BP 134/89 (BP Location: Left Arm, Patient Position: Sitting)   Pulse 88   Ht 5\' 5"  (1.651 m)   Wt 182 lb (82.6 kg)   BMI 30.29 kg/m   Physical Exam  Constitutional: She is oriented to person, place, and time. She appears well-developed and well-nourished.  HENT:  Head: Normocephalic and atraumatic.  Eyes: EOM are normal. Pupils are equal, round, and reactive to light.  Neck: Normal range of motion. Neck supple.  Pulmonary/Chest: Effort normal and breath sounds normal.  Abdominal: Soft. Bowel sounds are normal.  Neurological: She is alert and oriented to person, place, and time.  Skin: Skin is warm and dry.  Psychiatric: She has a normal mood and affect. Her behavior is normal. Judgment and thought content normal.    Right Hip Exam   Tenderness  The patient is experiencing tenderness in the posterior.  Range of Motion  Extension: normal  Flexion:  90 abnormal  Internal Rotation:  5 abnormal  External Rotation:  40 abnormal  Abduction:  25 abnormal    Adduction:  20 abnormal   Muscle Strength  Abduction: 5/5  Adduction: 5/5  Flexion: 5/5   Tests  FABER: positive Ober: negative  Other  Erythema: absent Scars: absent Sensation: normal Pulse: present  Comments:  Tender right SI joint, but intra articular hip injection provided the most relief in standing and walking tolerance    Right Shoulder Exam   Tenderness  The patient is experiencing tenderness in the acromion.  Range of Motion  Active Abduction: normal  Passive Abduction: normal  Extension: normal  Forward Flexion: normal  External Rotation: normal  Internal Rotation 0 degrees: T12  Internal Rotation 90 degrees: 80   Muscle Strength  Abduction: 5/5  Internal Rotation: 5/5  External Rotation: 5/5  Supraspinatus: 5/5  Subscapularis: 5/5  Biceps: 5/5   Tests  Apprehension: positive Cross Arm: negative Drop Arm: negative Hawkin's test: negative Impingement: positive  Other  Erythema: absent Scars: absent Sensation: normal Pulse: present  Comments:  Previous SAS injections provide relief, no history of surgery.   Left Shoulder Exam  Left shoulder exam is normal.      Specialty Comments:  No specialty comments available.  Imaging: Xr Hip Unilat W Or W/o Pelvis 2-3 Views Right  Result Date: 12/09/2016 Right hip lateral and AP pelvis show severe DJD right greater than left SI joints, right hip with inferomedial joint line narrowing, mild sclerosis of the inferomedial acetabulum. Lumbar fusion L2 to S1. Moderate degenerative disc changes L1-2. Right hip femoral head is spherical and no cysts noted. CT of Abdomen from 2016 reviewed with some minimal calcification of the anterolateral superior labrum, minimal cyst medial head-neck no significant joint line narrowing.    PMFS History: Patient Active Problem List   Diagnosis Date Noted  . Chest pain 02/23/2016  . Disorder of bone 01/12/2016  . Abdominal pain, epigastric 12/10/2014  . Spinal  stenosis in cervical region 07/01/2014  . Osteopenia 01/22/2014  . Pain in joint, ankle and foot 12/27/2013  . Diverticulitis large intestine w/o perforation or abscess w/o bleeding 07/25/2013  . TIA (transient ischemic attack) 04/20/2011  . DEPRESSION 03/12/2010  . OBESITY 10/08/2009  . CYST AND PSEUDOCYST OF PANCREAS 09/21/2009  . HYPERCHOLESTEROLEMIA 12/10/2008  . ROTATOR CUFF SYNDROME 12/10/2008  . COLONIC POLYPS, HYPERPLASTIC 05/02/2008  . Essential hypertension 09/17/2007  . Palpitations 09/17/2007  . Diabetes mellitus type 2, controlled, without complications ([place]) [id number]  . Hypothyroidism 01/04/2007  . HYPERCALCEMIA 01/04/2007  . Tobacco abuse 01/04/2007  . CARPAL TUNNEL SYNDROME 01/04/2007  . GASTROESOPHAGEAL REFLUX, NO ESOPHAGITIS 01/04/2007  . CERVICAL SPINE DISORDER, NOS 01/04/2007  . BACK PAIN W/RADIATION, UNSPECIFIED 01/04/2007   Past Medical History:  Diagnosis Date  . 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 Cervical spondylosis without myelopathy 09/12/2013  . Complication of anesthesia   . Diabetes mellitus without complication (HCC)    diet controlled -  no meds  . Diverticulitis   . Dyslipidemia   . Family history of anesthesia complication    Mother - N/V  . Gastroesophageal reflux disease    8./25/15- no problem  . Glucose intolerance (pre-diabetes)   . Heart murmur    nothing to be concerned  . History of blood transfusion   . History of hiatal hernia   . History of tobacco abuse   . Hypertension   . Hypothyroidism   . Kidney stone   . Obesity   . Oral thrush    after back surgeries  . PONV (postoperative nausea and vomiting)   . Stroke [facility])    TIA X 2-3; last in 2013  . Tachycardia     Family History  Problem Relation Age of Onset  . Heart attack Father   . Cancer Father   . Diabetes Father   . Stroke Mother   . Diabetes Mother   . Cancer Mother   . Heart attack Brother   . Cancer Other   . Coronary artery disease Other   . Stroke  Other   . Diabetes Other   . Hypertension Other   . Heart failure Other     Past Surgical History:  Procedure Laterality Date  . ABDOMINAL HYSTERECTOMY    . APPENDECTOMY    . BACK SURGERY     10 spine surgeries since 1988.  Marland Kitchen CARPAL TUNNEL RELEASE Right 04/07/2015   Procedure: RIGHT CARPAL TUNNEL RELEASE;  Surgeon: Daryll Brod, MD;  Location: [place];  Service: Orthopedics;  Laterality: Right;  . CARPAL TUNNEL RELEASE Left 09/29/2015   Procedure: CARPAL TUNNEL RELEASE LEFT ;  Surgeon: Daryll Brod, MD;  Location: [place];  Service: Orthopedics;  Laterality: Left;  . CHOLECYSTECTOMY    . CYSTOSCOPY W/ STONE MANIPULATION    . CYSTOSCOPY W/ URETERAL STENT PLACEMENT    . POSTERIOR CERVICAL FUSION/FORAMINOTOMY N/A 07/01/2014   Procedure: RIGHT C5-6 and C6-7 FORAMINOTOMY;  Surgeon: Jessy Oto, MD;  Location: [place];  Service: Orthopedics;  Laterality: N/A;  . TONSILLECTOMY    . ULNAR NERVE TRANSPOSITION Right 04/07/2015   Procedure: DECOMPRESSION  ULNAR NERVE RIGHT ELBOW;  Surgeon: Daryll Brod, MD;  Location: [place];  Service: Orthopedics;  Laterality: Right;  . ULNAR NERVE TRANSPOSITION Left 09/29/2015   Procedure: DECOMPRESSION  ULNAR NERVE LEFT ELBOW;  Surgeon: Daryll Brod, MD;  Location: [place];  Service: Orthopedics;  Laterality: Left;   Social History   Occupational History  . Retired Cabin crew    Social History Main Topics  . Smoking status: Current Every Day Smoker    Packs/day: 0.50    Years: 43.00    Types: Cigarettes  . Smokeless tobacco: Never Used     Comment: quit early 02/2016  . Alcohol use No  . Drug use: No  . Sexual activity: Not Currently

## 2016-12-09 NOTE — Patient Instructions (Signed)
  The right hip and Right SI joint are suffering from osteoarthritis, only real proven treatments are Weight loss, NSIADs like diclofenac and exercise. Well padded shoes help. Ice the posterior hip 2-3 times a day 15-20 mins at a time. Use a cane in the left hand for ambulation Dr. Romona Curls secretary will call to schedule a further right hip intraarticular injection with steroid and marcaine.

## 2016-12-13 ENCOUNTER — Encounter (INDEPENDENT_AMBULATORY_CARE_PROVIDER_SITE_OTHER): Payer: Self-pay | Admitting: Physical Medicine and Rehabilitation

## 2016-12-13 ENCOUNTER — Ambulatory Visit (INDEPENDENT_AMBULATORY_CARE_PROVIDER_SITE_OTHER): Payer: Self-pay

## 2016-12-13 ENCOUNTER — Ambulatory Visit (INDEPENDENT_AMBULATORY_CARE_PROVIDER_SITE_OTHER): Payer: Medicare Other | Admitting: Physical Medicine and Rehabilitation

## 2016-12-13 VITALS — BP 126/88

## 2016-12-13 DIAGNOSIS — M25551 Pain in right hip: Secondary | ICD-10-CM | POA: Diagnosis not present

## 2016-12-13 NOTE — Patient Instructions (Signed)

## 2016-12-13 NOTE — Progress Notes (Signed)
Danielle [name] - 60 y.o. female MRN [id number]  Date of birth: 1956-02-09  Office Visit Note: Visit Date: 12/13/2016 PCP: Zigmund Gottron, MD Referred by: Danielle Resides, MD  Subjective: Chief Complaint  Patient presents with  . Right Hip - Pain   HPI: [name] is a 61 year old female with Increased right hip pain for 1 month. Groin pain. No radiating pain. Worse with sitting or standing long periods. Her pain is worse with rotation. Prior hip injection gave her quite a bit relief back in September. Dr. Louanne Skye requests a diagnostic and hopefully therapeutic intra-articular hip injection with fluoroscopic guidance.    ROS Otherwise per HPI.  Assessment & Plan: Visit Diagnoses:  1. Pain in right hip     Plan: Findings:  Right hip injection intra-articular with fluoroscopic guidance.    Meds & Orders: No orders of the defined types were placed in this encounter.   Orders Placed This Encounter  Procedures  . Large Joint Injection/Arthrocentesis  . XR C-ARM NO REPORT    Follow-up: Return if symptoms worsen or fail to improve, for Follow-up with Dr. Louanne Skye.   Procedures: Intra-articular hip injection with fluoroscopic guidance Date/Time: 12/13/2016 3:16 PM Performed by: Magnus Sinning Authorized by: Magnus Sinning   Consent Given by:  Patient Site marked: the procedure site was marked   Timeout: prior to procedure the correct patient, procedure, and site was verified   Indications:  Pain and diagnostic evaluation Location:  Hip Site:  R hip joint Prep: patient was prepped and draped in usual sterile fashion   Needle Size:  22 G Needle length (in): 5.0 inches. Approach:  Anterior Ultrasound Guidance: No   Fluoroscopic Guidance: Yes   Arthrogram: No   Medications:  80 mg triamcinolone acetonide 40 MG/ML; 3 mL bupivacaine 0.5 % Aspiration Attempted: Yes   Aspirate amount (mL):  0 Patient tolerance:  Patient tolerated the procedure well with no immediate  complications  There was excellent flow of contrast producing a partial arthrogram of the hip. The patient did have relief of her symptoms during the anesthetic phase of the injection.     No notes on file   Clinical History: No specialty comments available.  She reports that she has been smoking Cigarettes.  She has a 21.50 pack-year smoking history. She has never used smokeless tobacco.   Recent Labs  12/31/15 0905 06/14/16 0220  HGBA1C 6.4 6.5*    Objective:  VS:  HT:    WT:   BMI:     BP:126/88  HR: bpm  TEMP: ( )  RESP:  Physical Exam  Musculoskeletal:  Patient ambulates with antalgic gait to the right. She does have pain with internal rotation of the right hip.    Ortho Exam Imaging: Xr C-arm No Report  Result Date: 12/13/2016 Please see Notes or Procedures tab for imaging impression.   Past Medical/Family/Surgical/Social History: Medications & Allergies reviewed per EMR Patient Active Problem List   Diagnosis Date Noted  . Chest pain 02/23/2016  . Disorder of bone 01/12/2016  . Abdominal pain, epigastric 12/10/2014  . Spinal stenosis in cervical region 07/01/2014  . Osteopenia 01/22/2014  . Pain in joint, ankle and foot 12/27/2013  . Diverticulitis large intestine w/o perforation or abscess w/o bleeding 07/25/2013  . TIA (transient ischemic attack) 04/20/2011  . DEPRESSION 03/12/2010  . OBESITY 10/08/2009  . CYST AND PSEUDOCYST OF PANCREAS 09/21/2009  . HYPERCHOLESTEROLEMIA 12/10/2008  . ROTATOR CUFF SYNDROME 12/10/2008  . COLONIC POLYPS, HYPERPLASTIC 05/02/2008  .  Essential hypertension 09/17/2007  . Palpitations 09/17/2007  . Diabetes mellitus type 2, controlled, without complications ([place]) [id number]  . Hypothyroidism 01/04/2007  . HYPERCALCEMIA 01/04/2007  . Tobacco abuse 01/04/2007  . CARPAL TUNNEL SYNDROME 01/04/2007  . GASTROESOPHAGEAL REFLUX, NO ESOPHAGITIS 01/04/2007  . CERVICAL SPINE DISORDER, NOS 01/04/2007  . BACK PAIN W/RADIATION,  UNSPECIFIED 01/04/2007   Past Medical History:  Diagnosis Date  . 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 Cervical spondylosis without myelopathy 09/12/2013  . Complication of anesthesia   . Diabetes mellitus without complication (HCC)    diet controlled -no meds  . Diverticulitis   . Dyslipidemia   . Family history of anesthesia complication    Mother - N/V  . Gastroesophageal reflux disease    8./25/15- no problem  . Glucose intolerance (pre-diabetes)   . Heart murmur    nothing to be concerned  . History of blood transfusion   . History of hiatal hernia   . History of tobacco abuse   . Hypertension   . Hypothyroidism   . Kidney stone   . Obesity   . Oral thrush    after back surgeries  . PONV (postoperative nausea and vomiting)   . Stroke [facility])    TIA X 2-3; last in 2013  . Tachycardia    Family History  Problem Relation Age of Onset  . Heart attack Father   . Cancer Father   . Diabetes Father   . Stroke Mother   . Diabetes Mother   . Cancer Mother   . Heart attack Brother   . Cancer Other   . Coronary artery disease Other   . Stroke Other   . Diabetes Other   . Hypertension Other   . Heart failure Other    Past Surgical History:  Procedure Laterality Date  . ABDOMINAL HYSTERECTOMY    . APPENDECTOMY    . BACK SURGERY     10 spine surgeries since 1988.  Marland Kitchen CARPAL TUNNEL RELEASE Right 04/07/2015   Procedure: RIGHT CARPAL TUNNEL RELEASE;  Surgeon: Daryll Brod, MD;  Location: [place];  Service: Orthopedics;  Laterality: Right;  . CARPAL TUNNEL RELEASE Left 09/29/2015   Procedure: CARPAL TUNNEL RELEASE LEFT ;  Surgeon: Daryll Brod, MD;  Location: [place];  Service: Orthopedics;  Laterality: Left;  . CHOLECYSTECTOMY    . CYSTOSCOPY W/ STONE MANIPULATION    . CYSTOSCOPY W/ URETERAL STENT PLACEMENT    . POSTERIOR CERVICAL FUSION/FORAMINOTOMY N/A 07/01/2014   Procedure: RIGHT C5-6 and C6-7 FORAMINOTOMY;  Surgeon: Jessy Oto, MD;   Location: [place];  Service: Orthopedics;  Laterality: N/A;  . TONSILLECTOMY    . ULNAR NERVE TRANSPOSITION Right 04/07/2015   Procedure: DECOMPRESSION  ULNAR NERVE RIGHT ELBOW;  Surgeon: Daryll Brod, MD;  Location: [place];  Service: Orthopedics;  Laterality: Right;  . ULNAR NERVE TRANSPOSITION Left 09/29/2015   Procedure: DECOMPRESSION  ULNAR NERVE LEFT ELBOW;  Surgeon: Daryll Brod, MD;  Location: [place];  Service: Orthopedics;  Laterality: Left;   Social History   Occupational History  . Retired Cabin crew    Social History Main Topics  . Smoking status: Current Every Day Smoker    Packs/day: 0.50    Years: 43.00    Types: Cigarettes  . Smokeless tobacco: Never Used     Comment: quit early 02/2016  . Alcohol use No  . Drug use: No  . Sexual activity: Not  Currently

## 2016-12-14 MED ORDER — TRIAMCINOLONE ACETONIDE 40 MG/ML IJ SUSP
80.0000 mg | INTRAMUSCULAR | Status: AC | PRN
  Administered 2016-12-13: 80 mg via INTRA_ARTICULAR

## 2016-12-14 MED ORDER — BUPIVACAINE HCL 0.5 % IJ SOLN
3.0000 mL | INTRAMUSCULAR | Status: AC | PRN
  Administered 2016-12-13: 3 mL via INTRA_ARTICULAR

## 2017-01-10 ENCOUNTER — Other Ambulatory Visit (INDEPENDENT_AMBULATORY_CARE_PROVIDER_SITE_OTHER): Payer: Self-pay | Admitting: Orthopedic Surgery

## 2017-01-11 NOTE — Telephone Encounter (Signed)
Can you advise? 

## 2017-01-12 NOTE — Telephone Encounter (Signed)
She's been taking ibuprofen for a while. Needs to have blood work for CBC and complete metabolic profile due to long-term anti-inflammatory use.

## 2017-01-18 ENCOUNTER — Telehealth (INDEPENDENT_AMBULATORY_CARE_PROVIDER_SITE_OTHER): Payer: Self-pay | Admitting: *Deleted

## 2017-01-18 NOTE — Telephone Encounter (Signed)
Pt called stating she needs her ibuprofen refilled. Pt stated the pharmacy called [place] and was told to tell pt that If she is sick she needs to come in. Pt has an appt next month but is wondering if this can be filled before that

## 2017-01-18 NOTE — Telephone Encounter (Signed)
Has had several TIAs in the past year and probably should not take a NSAID as this would compete with aspirin for the good side effects of aspirin that thin the blood and prevent further TIA and possibly stroke. She should call her neurologist and discuss this with them. We can try tramadol if she wishes. Danielle [name]

## 2017-01-18 NOTE — Telephone Encounter (Signed)
Pt called stating she needs her ibuprofen refilled. Pt stated the pharmacy called [place] and was told to tell pt that If she is sick she needs to come in. Pt has an appt next month but is wondering if this can be filled before that.

## 2017-01-20 NOTE — Telephone Encounter (Signed)
I called and advised patient and she states that she will discuss with Dr. Louanne Skye when he comes in to next appt

## 2017-01-30 ENCOUNTER — Encounter: Payer: Self-pay | Admitting: Family Medicine

## 2017-01-30 ENCOUNTER — Ambulatory Visit (INDEPENDENT_AMBULATORY_CARE_PROVIDER_SITE_OTHER): Payer: Medicare Other | Admitting: Family Medicine

## 2017-01-30 VITALS — BP 152/100 | HR 99 | Temp 97.9°F | Ht 65.0 in | Wt 174.8 lb

## 2017-01-30 DIAGNOSIS — N3001 Acute cystitis with hematuria: Secondary | ICD-10-CM

## 2017-01-30 DIAGNOSIS — E119 Type 2 diabetes mellitus without complications: Secondary | ICD-10-CM

## 2017-01-30 LAB — POCT URINALYSIS DIP (MANUAL ENTRY)
Bilirubin, UA: NEGATIVE
GLUCOSE UA: NEGATIVE
Ketones, POC UA: NEGATIVE
Nitrite, UA: POSITIVE — AB
Protein Ur, POC: 100 — AB
Spec Grav, UA: 1.025 (ref 1.030–1.035)
Urobilinogen, UA: 0.2 (ref ?–2.0)
pH, UA: 6.5 (ref 5.0–8.0)

## 2017-01-30 LAB — POCT GLYCOSYLATED HEMOGLOBIN (HGB A1C): Hemoglobin A1C: 6.3

## 2017-01-30 LAB — POCT UA - MICROSCOPIC ONLY

## 2017-01-30 MED ORDER — CIPROFLOXACIN HCL 500 MG PO TABS: 500.0000 mg | ORAL_TABLET | Freq: Two times a day (BID) | ORAL | 0 refills | Status: DC

## 2017-01-30 NOTE — Progress Notes (Signed)
Subjective:     Patient ID: Danielle [name], female   DOB: 1956-08-07, 61 y.o.   MRN: [id number]  HPI Danielle [name] is a 61yo female presenting today for possible UTI. Reports feeling of pressure in her bladder, increased frequency, and increased urgency since Thursday 3/22. Also notes mild right flank pain. Notes some nausea this morning, but denies vomiting. Denies fever. Reports history of UTI and kidney stones. Allergy to Keflex noted.    Review of Systems Per HPI    Objective:   Physical Exam  Constitutional: She appears well-developed and well-nourished. No distress.  HENT:  Moist mucous membranes  Cardiovascular: Normal rate and regular rhythm.   No murmur heard. Pulmonary/Chest: Effort normal. No respiratory distress. She has no wheezes.  Abdominal: Soft. She exhibits no distension. There is no tenderness.  No CVA tenderness noted  Musculoskeletal: She exhibits no edema.  Skin: No rash noted.  Psychiatric: She has a normal mood and affect. Her behavior is normal.      Assessment and Plan:     1. Acute cystitis with hematuria Urinalysis with large leukocytes, positive nitrites, large RBC. Possible beginning stages of pyelonephritis with patient reporting very mild right flank pain and nausea, however no CVA tenderness or fever. Keflex allergy noted. Prescription for Ciprofloxacin given. Handout on Pyelonephritis given and patient to notify office if she notices worsening right flank pain, fever, or nausea/vomiting. Urine culture obtained. Follow up if no improvement.

## 2017-01-30 NOTE — Patient Instructions (Signed)
Pyelonephritis, Adult Pyelonephritis is a kidney infection. The kidneys are the organs that filter a person's blood and move waste out of the bloodstream and into the urine. Urine passes from the kidneys, through the ureters, and into the bladder. There are two main types of pyelonephritis:  Infections that come on quickly without any warning (acute pyelonephritis).  Infections that last for a long period of time (chronic pyelonephritis). In most cases, the infection clears up with treatment and does not cause further problems. More severe infections or chronic infections can sometimes spread to the bloodstream or lead to other problems with the kidneys. What are the causes? This condition is usually caused by:  Bacteria traveling from the bladder to the kidney through infected urine. The urine in the bladder can become infected with bacteria from:  Bladder infection (cystitis).  Inflammation of the prostate gland (prostatitis).  Sexual intercourse, in females.  Bacteria traveling from the bloodstream to the kidney. What increases the risk? This condition is more likely to develop in:  Pregnant women.  Older people.  People who have diabetes.  People who have kidney stones or bladder stones.  People who have other abnormalities of the kidney or ureter.  People who have a catheter placed in the bladder.  People who have cancer.  People who are sexually active.  Women who use spermicides.  People who have had a prior urinary tract infection. What are the signs or symptoms? Symptoms of this condition include:  Frequent urination.  Strong or persistent urge to urinate.  Burning or stinging when urinating.  Abdominal pain.  Back pain.  Pain in the side or flank area.  Fever.  Chills.  Blood in the urine, or dark urine.  Nausea.  Vomiting. How is this diagnosed? This condition may be diagnosed based on:  Medical history and physical exam.  Urine  tests.  Blood tests. You may also have imaging tests of the kidneys, such as an ultrasound or CT scan. How is this treated? Treatment for this condition may depend on the severity of the infection.  If the infection is mild and is found early, you may be treated with antibiotic medicines taken by mouth. You will need to drink fluids to remain hydrated.  If the infection is more severe, you may need to stay in the hospital and receive antibiotics given directly into a vein through an IV tube. You may also need to receive fluids through an IV tube if you are not able to remain hydrated. After your hospital stay, you may need to take oral antibiotics for a period of time. Other treatments may be required, depending on the cause of the infection. Follow these instructions at home: Medicines   Take over-the-counter and prescription medicines only as told by your health care provider.  If you were prescribed an antibiotic medicine, take it as told by your health care provider. Do not stop taking the antibiotic even if you start to feel better. General instructions   Drink enough fluid to keep your urine clear or pale yellow.  Avoid caffeine, tea, and carbonated beverages. They tend to irritate the bladder.  Urinate often. Avoid holding in urine for long periods of time.  Urinate before and after sex.  After a bowel movement, women should cleanse from front to back. Use each tissue only once.  Keep all follow-up visits as told by your health care provider. This is important. Contact a health care provider if:  Your symptoms do not get better  after 2 days of treatment.  Your symptoms get worse.  You have a fever. Get help right away if:  You are unable to take your antibiotics or fluids.  You have shaking chills.  You vomit.  You have severe flank or back pain.  You have extreme weakness or fainting. This information is not intended to replace advice given to you by your  health care provider. Make sure you discuss any questions you have with your health care provider. Document Released: 10/24/2005 Document Revised: 03/31/2016 Document Reviewed: 02/16/2015 Elsevier Interactive Patient Education  2017 [employer].

## 2017-02-03 LAB — URINE CULTURE

## 2017-02-09 ENCOUNTER — Ambulatory Visit (INDEPENDENT_AMBULATORY_CARE_PROVIDER_SITE_OTHER): Payer: Self-pay

## 2017-02-09 ENCOUNTER — Encounter (INDEPENDENT_AMBULATORY_CARE_PROVIDER_SITE_OTHER): Payer: Self-pay

## 2017-02-09 ENCOUNTER — Encounter (INDEPENDENT_AMBULATORY_CARE_PROVIDER_SITE_OTHER): Payer: Self-pay | Admitting: Specialist

## 2017-02-09 ENCOUNTER — Ambulatory Visit (INDEPENDENT_AMBULATORY_CARE_PROVIDER_SITE_OTHER): Payer: Medicare Other | Admitting: Specialist

## 2017-02-09 VITALS — BP 140/90 | HR 82 | Ht 65.0 in | Wt 182.0 lb

## 2017-02-09 DIAGNOSIS — M25551 Pain in right hip: Secondary | ICD-10-CM | POA: Diagnosis not present

## 2017-02-09 DIAGNOSIS — E559 Vitamin D deficiency, unspecified: Secondary | ICD-10-CM | POA: Diagnosis not present

## 2017-02-09 DIAGNOSIS — M533 Sacrococcygeal disorders, not elsewhere classified: Secondary | ICD-10-CM | POA: Diagnosis not present

## 2017-02-09 DIAGNOSIS — Z981 Arthrodesis status: Secondary | ICD-10-CM | POA: Diagnosis not present

## 2017-02-09 MED ORDER — VITAMIN D (ERGOCALCIFEROL) 1.25 MG (50000 UNIT) PO CAPS: 50000.0000 [IU] | ORAL_CAPSULE | ORAL | 0 refills | Status: DC
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
## 2017-02-09 NOTE — Patient Instructions (Addendum)
  Hip is suffering from osteoarthritis, only real proven treatments are Weight loss, NSIADs like motrin and exercise. Well padded shoes help. Ice the hip 2-3 times a day 15-20 mins at a time. I recommend the use of a cane or walker to decrease the stress over the right hip until a MRI can be obtained. Vitamin D replacement started 50K units weekly for 2 months then 2000 units daily

## 2017-02-19 ENCOUNTER — Ambulatory Visit
Admission: RE | Admit: 2017-02-19 | Discharge: 2017-02-19 | Disposition: A | Discharge status: Home / Self Care | Payer: Medicare Other | Source: Ambulatory Visit | Attending: Specialist | Admitting: Specialist

## 2017-02-19 DIAGNOSIS — M533 Sacrococcygeal disorders, not elsewhere classified: Secondary | ICD-10-CM

## 2017-02-19 DIAGNOSIS — M25551 Pain in right hip: Secondary | ICD-10-CM

## 2017-03-02 ENCOUNTER — Encounter (INDEPENDENT_AMBULATORY_CARE_PROVIDER_SITE_OTHER): Payer: Self-pay | Admitting: Specialist

## 2017-03-02 ENCOUNTER — Ambulatory Visit (INDEPENDENT_AMBULATORY_CARE_PROVIDER_SITE_OTHER): Payer: Medicare Other | Admitting: Specialist

## 2017-03-02 VITALS — BP 155/94 | HR 86 | Ht 65.0 in | Wt 170.0 lb

## 2017-03-02 DIAGNOSIS — M7061 Trochanteric bursitis, right hip: Secondary | ICD-10-CM | POA: Diagnosis not present

## 2017-03-02 DIAGNOSIS — M160 Bilateral primary osteoarthritis of hip: Secondary | ICD-10-CM

## 2017-03-02 DIAGNOSIS — M5136 Other intervertebral disc degeneration, lumbar region: Secondary | ICD-10-CM | POA: Diagnosis not present

## 2017-03-02 DIAGNOSIS — M7072 Other bursitis of hip, left hip: Secondary | ICD-10-CM | POA: Diagnosis not present

## 2017-03-02 DIAGNOSIS — M7062 Trochanteric bursitis, left hip: Secondary | ICD-10-CM

## 2017-03-02 MED ORDER — CELECOXIB 100 MG PO CAPS: 100.0000 mg | ORAL_CAPSULE | Freq: Two times a day (BID) | ORAL | 3 refills | Status: DC

## 2017-03-02 NOTE — Patient Instructions (Addendum)
  Hips suffering from bursitis, osteoarthritis, only real proven treatments are Weight loss, NSIADs like celebrex and exercise. Well padded shoes help. Ice the hip 2-3 times a day 15-20 mins at a time. Start celebrex but first call primary care to discuss if this is appropriate with the use of plavix with history of TIA Dr. Romona Curls secretary should contact you to arrange for the right greater trochanter injection via fluoro since previous injections are not as affective.

## 2017-03-02 NOTE — Progress Notes (Signed)
Office Visit Note   Patient: Danielle [name]           Date of Birth: Mar 05, 1956           MRN: [id number] Visit Date: 03/02/2017              Requested by: [name], MD [address] [place], [place] [address] PCP: Zigmund Gottron, MD   Assessment & Plan: Visit Diagnoses:  1. Bilateral primary osteoarthritis of hip   2. Trochanteric bursitis of both hips   3. Iliopsoas bursitis of left hip   4. DDD (degenerative disc disease), lumbar     Plan: Hips suffering from bursitis, osteoarthritis, only real proven treatments are Weight loss, NSIADs like celebrex and exercise. Well padded shoes help. Ice the hip 2-3 times a day 15-20 mins at a time. Start celebrex but first call primary care to discuss if this is appropriate with the use of plavix with history of TIA Dr. Romona Curls secretary should contact you to arrange for the right greater trochanter injection via fluoro since previous injections are not as affective.    Follow-Up Instructions: Return in about 3 months (around 06/01/2017).   Orders:  Orders Placed This Encounter  Procedures  . Ambulatory referral to Physical Medicine Rehab   Meds ordered this encounter  Medications  . celecoxib (CELEBREX) 100 MG capsule    Sig: Take 1 capsule (100 mg total) by mouth 2 (two) times daily.    Dispense:  60 capsule    Refill:  3      Procedures: No procedures performed   Clinical Data: No additional findings.   Subjective: No chief complaint on file.   Pain in the right hip greater than left, history of TIAs, off NSAIDs due to use of aspirin and plavix to prevent competition for anticoagulative affect of the plavix and ASA. Now with persistent right greater than left hip pain worse with standing and walking, pain is constant, does not desire an indwelling spinal cord stimulator. MRI with  Bilateral hip bursitis at greater trochanter, the assymptomatic left hip with iliopsoas bursitis and slight left  hip effusion. Previous lab for rheum negative.    Review of Systems  Constitutional: Negative.   HENT: Negative.   Eyes: Negative.   Respiratory: Negative.   Cardiovascular: Negative.   Gastrointestinal: Negative.   Endocrine: Negative.   Genitourinary: Negative.   Musculoskeletal: Negative.   Skin: Negative.   Allergic/Immunologic: Negative.   Neurological: Negative.   Hematological: Negative.   Psychiatric/Behavioral: Negative.      Objective: Vital Signs: BP (!) 155/94 (BP Location: Left Arm, Patient Position: Sitting, Cuff Size: Large)   Pulse 86   Ht 5\' 5"  (1.651 m)   Wt 170 lb (77.1 kg)   BMI 28.29 kg/m   Physical Exam  Constitutional: She is oriented to person, place, and time. She appears well-developed and well-nourished.  HENT:  Head: Normocephalic and atraumatic.  Eyes: EOM are normal. Pupils are equal, round, and reactive to light.  Neck: Normal range of motion. Neck supple.  Pulmonary/Chest: Effort normal and breath sounds normal.  Abdominal: Soft. Bowel sounds are normal.  Neurological: She is alert and oriented to person, place, and time.  Skin: Skin is warm and dry.  Psychiatric: She has a normal mood and affect. Her behavior is normal. Judgment and thought content normal.    Back Exam   Tenderness  The patient is experiencing tenderness in the lumbar.  Range of Motion  Extension:  abnormal  Flexion: normal  Lateral Bend Right: abnormal  Lateral Bend Left: abnormal  Rotation Right: abnormal  Rotation Left: abnormal   Muscle Strength  Right Quadriceps:  5/5  Left Quadriceps:  5/5  Right Hamstrings:  5/5  Left Hamstrings:  5/5   Tests  Straight leg raise right: negative Straight leg raise left: negative  Reflexes  Patellar: normal Achilles:  0/4 abnormal Babinski's sign: normal   Other  Toe Walk: normal Heel Walk: normal Sensation: normal Gait: normal  Erythema: no back redness Scars: absent  Comments:  Right ankle reflex  0      Specialty Comments:  No specialty comments available.  Imaging: No results found.   PMFS History: Patient Active Problem List   Diagnosis Date Noted  . Vitamin D deficiency 02/09/2017    Priority: High    Class: Chronic  . Chest pain 02/23/2016  . Disorder of bone 01/12/2016  . Abdominal pain, epigastric 12/10/2014  . Spinal stenosis in cervical region 07/01/2014  . Osteopenia 01/22/2014  . Pain in joint, ankle and foot 12/27/2013  . Diverticulitis large intestine w/o perforation or abscess w/o bleeding 07/25/2013  . TIA (transient ischemic attack) 04/20/2011  . DEPRESSION 03/12/2010  . OBESITY 10/08/2009  . CYST AND PSEUDOCYST OF PANCREAS 09/21/2009  . HYPERCHOLESTEROLEMIA 12/10/2008  . ROTATOR CUFF SYNDROME 12/10/2008  . COLONIC POLYPS, HYPERPLASTIC 05/02/2008  . Essential hypertension 09/17/2007  . Palpitations 09/17/2007  . Diabetes mellitus type 2, controlled, without complications ([place]) 40/98/1191  . Hypothyroidism 01/04/2007  . HYPERCALCEMIA 01/04/2007  . Tobacco abuse 01/04/2007  . CARPAL TUNNEL SYNDROME 01/04/2007  . GASTROESOPHAGEAL REFLUX, NO ESOPHAGITIS 01/04/2007  . CERVICAL SPINE DISORDER, NOS 01/04/2007  . BACK PAIN W/RADIATION, UNSPECIFIED 01/04/2007   Past Medical History:  Diagnosis Date  . 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 Cervical spondylosis without myelopathy 09/12/2013  . Complication of anesthesia   . Diabetes mellitus without complication (HCC)    diet controlled -no meds  . Diverticulitis   . Dyslipidemia   . Family history of anesthesia complication    Mother - N/V  . Gastroesophageal reflux disease    8./25/15- no problem  . Glucose intolerance (pre-diabetes)   . Heart murmur    nothing to be concerned  . History of blood transfusion   . History of hiatal hernia   . History of tobacco abuse   . Hypertension   . Hypothyroidism   . Kidney stone   . Obesity   . Oral thrush    after back surgeries  . PONV  (postoperative nausea and vomiting)   . Stroke [facility])    TIA X 2-3; last in 2013  . Tachycardia     Family History  Problem Relation Age of Onset  . Heart attack Father   . Cancer Father   . Diabetes Father   . Stroke Mother   . Diabetes Mother   . Cancer Mother   . Heart attack Brother   . Cancer Other   . Coronary artery disease Other   . Stroke Other   . Diabetes Other   . Hypertension Other   . Heart failure Other     Past Surgical History:  Procedure Laterality Date  . ABDOMINAL HYSTERECTOMY    . APPENDECTOMY    . BACK SURGERY     10 spine surgeries since 1988.  Marland Kitchen CARPAL TUNNEL RELEASE Right 04/07/2015   Procedure: RIGHT CARPAL TUNNEL RELEASE;  Surgeon: Daryll Brod, MD;  Location: [facility];  Service: Orthopedics;  Laterality: Right;  . CARPAL TUNNEL RELEASE Left 09/29/2015   Procedure: CARPAL TUNNEL RELEASE LEFT ;  Surgeon: Daryll Brod, MD;  Location: [place];  Service: Orthopedics;  Laterality: Left;  . CHOLECYSTECTOMY    . CYSTOSCOPY W/ STONE MANIPULATION    . CYSTOSCOPY W/ URETERAL STENT PLACEMENT    . POSTERIOR CERVICAL FUSION/FORAMINOTOMY N/A 07/01/2014   Procedure: RIGHT C5-6 and C6-7 FORAMINOTOMY;  Surgeon: Jessy Oto, MD;  Location: [place];  Service: Orthopedics;  Laterality: N/A;  . TONSILLECTOMY    . ULNAR NERVE TRANSPOSITION Right 04/07/2015   Procedure: DECOMPRESSION  ULNAR NERVE RIGHT ELBOW;  Surgeon: Daryll Brod, MD;  Location: [place];  Service: Orthopedics;  Laterality: Right;  . ULNAR NERVE TRANSPOSITION Left 09/29/2015   Procedure: DECOMPRESSION  ULNAR NERVE LEFT ELBOW;  Surgeon: Daryll Brod, MD;  Location: [facility];  Service: Orthopedics;  Laterality: Left;   Social History   Occupational History  . Retired Cabin crew    Social History Main Topics  . Smoking status: Current Every Day Smoker    Packs/day: 0.50    Years: 43.00    Types: Cigarettes  . Smokeless tobacco:  Never Used     Comment: quit early 02/2016  . Alcohol use No  . Drug use: No  . Sexual activity: Not Currently

## 2017-03-10 ENCOUNTER — Ambulatory Visit (INDEPENDENT_AMBULATORY_CARE_PROVIDER_SITE_OTHER): Payer: Self-pay

## 2017-03-10 ENCOUNTER — Ambulatory Visit (INDEPENDENT_AMBULATORY_CARE_PROVIDER_SITE_OTHER): Payer: Medicare Other | Admitting: Physical Medicine and Rehabilitation

## 2017-03-10 ENCOUNTER — Encounter (INDEPENDENT_AMBULATORY_CARE_PROVIDER_SITE_OTHER): Payer: Self-pay | Admitting: Physical Medicine and Rehabilitation

## 2017-03-10 VITALS — BP 132/87 | HR 90

## 2017-03-10 DIAGNOSIS — M7061 Trochanteric bursitis, right hip: Secondary | ICD-10-CM | POA: Diagnosis not present

## 2017-03-10 DIAGNOSIS — M25551 Pain in right hip: Secondary | ICD-10-CM

## 2017-03-10 NOTE — Progress Notes (Signed)
Danielle [name] - 61 y.o. female MRN [id number]  Date of birth: 06-04-1956  Office Visit Note: Visit Date: 03/10/2017 PCP: Danielle [name] Referred by: Danielle [name]  Subjective: Chief Complaint  Patient presents with  . Right Hip - Pain   HPI: Danielle [name] is a 61 year old female with chronic long term recalcitrant right hip pain. She has had an MRI of the right hip recently performed which is reviewed below. Prior right intra-articular hip injection performed in February helped to some degree. Dr. Louanne [name] would like a greater trochanteric injection performed with fluoroscopic guidance to see if this diagnostically would help her. MRI of the hip did not show much in the way of significant hip arthritis.    ROS Otherwise per HPI.  Assessment & Plan: Visit Diagnoses:  1. Greater trochanteric bursitis, right   2. Pain in right hip     Plan: Findings:  Right greater trochanteric bursa injection with fluoroscopic guidance. If patient does not obtain much relief would consider repeating the intra-articular hip injection. If that was very diagnostic MRI arthrogram may be suggested.    Meds & Orders: No orders of the defined types were placed in this encounter.   Orders Placed This Encounter  Procedures  . Large Joint Injection/Arthrocentesis  . XR C-ARM NO REPORT    Follow-up: Return if symptoms worsen or fail to improve, for Dr. Louanne [name].   Procedures: Greater trochanteric bursa injection fluoroscopic guidance Date/Time: 03/10/2017 9:38 AM Performed by: Danielle [name] Authorized by: Danielle [name]   Consent Given by:  Parent Site marked: the procedure site was marked   Timeout: prior to procedure the correct patient, procedure, and site was verified   Indications:  Pain and diagnostic evaluation Location:  Hip Site:  R greater trochanter Prep: patient was prepped and draped in usual sterile fashion   Needle Size:  22 G Needle Length:  3.5 inches Approach:   Lateral Ultrasound Guidance: No   Fluoroscopic Guidance: Yes   Arthrogram: No   Medications:  4 mL bupivacaine 0.25 %; 4 mL lidocaine 2 %; 80 mg triamcinolone acetonide 40 MG/ML Aspiration Attempted: No   Patient tolerance:  Patient tolerated the procedure well with no immediate complications  There was excellent flow of contrast outlined the greater trochanteric bursa without vascular uptake.     No notes on file   Clinical History: Right hip MRI IMPRESSION: 1. Mild bilateral trochanteric bursitis and mild left iliopsoas bursitis. 2. Small left hip joint effusion, no right hip joint effusion. 3. Mild spurring of the acetabula and femoral heads without significant articular cartilage narrowing identified. 4. Sigmoid colon diverticulosis. 5. No edema in the lesser trochanter on the right, or evidence of trochanteric avulsion.   Electronically Signed   By: Danielle [name] M.D.   On: 02/19/2017 13:43  She reports that she has been smoking Cigarettes.  She has a 21.50 pack-year smoking history. She has never used smokeless tobacco.   Recent Labs  06/14/16 0220 01/30/17 1540  HGBA1C 6.5* 6.3    Objective:  VS:  HT:    WT:   BMI:     BP:132/87  HR:90bpm  TEMP: ( )  RESP:  Physical Exam  Musculoskeletal:  Patient ambulates without aid with good distal strength. Mild pain at end ranges of hip rotation. Some tenderness over the greater trochanters.    Ortho Exam Imaging: No results found.  Past Medical/Family/Surgical/Social History: Medications & Allergies reviewed per EMR Patient Active Problem List  Diagnosis Date Noted  . Vitamin D deficiency 02/09/2017    Class: Chronic  . Chest pain 02/23/2016  . Disorder of bone 01/12/2016  . Abdominal pain, epigastric 12/10/2014  . Spinal stenosis in cervical region 07/01/2014  . Osteopenia 01/22/2014  . Pain in joint, ankle and foot 12/27/2013  . Diverticulitis large intestine w/o perforation or abscess w/o  bleeding 07/25/2013  . TIA (transient ischemic attack) 04/20/2011  . DEPRESSION 03/12/2010  . OBESITY 10/08/2009  . CYST AND PSEUDOCYST OF PANCREAS 09/21/2009  . HYPERCHOLESTEROLEMIA 12/10/2008  . ROTATOR CUFF SYNDROME 12/10/2008  . COLONIC POLYPS, HYPERPLASTIC 05/02/2008  . Essential hypertension 09/17/2007  . Palpitations 09/17/2007  . Diabetes mellitus type 2, controlled, without complications ([place]) 03/00/9233  . Hypothyroidism 01/04/2007  . HYPERCALCEMIA 01/04/2007  . Tobacco abuse 01/04/2007  . CARPAL TUNNEL SYNDROME 01/04/2007  . GASTROESOPHAGEAL REFLUX, NO ESOPHAGITIS 01/04/2007  . CERVICAL SPINE DISORDER, NOS 01/04/2007  . BACK PAIN W/RADIATION, UNSPECIFIED 01/04/2007   Past Medical History:  Diagnosis Date  . 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 Cervical spondylosis without myelopathy 09/12/2013  . Complication of anesthesia   . Diabetes mellitus without complication (HCC)    diet controlled -no meds  . Diverticulitis   . Dyslipidemia   . Family history of anesthesia complication    Mother - N/V  . Gastroesophageal reflux disease    8./25/15- no problem  . Glucose intolerance (pre-diabetes)   . Heart murmur    nothing to be concerned  . History of blood transfusion   . History of hiatal hernia   . History of tobacco abuse   . Hypertension   . Hypothyroidism   . Kidney stone   . Obesity   . Oral thrush    after back surgeries  . PONV (postoperative nausea and vomiting)   . Stroke [facility])    TIA X 2-3; last in 2013  . Tachycardia    Family History  Problem Relation Age of Onset  . Heart attack Father   . Cancer Father   . Diabetes Father   . Stroke Mother   . Diabetes Mother   . Cancer Mother   . Heart attack Brother   . Cancer Other   . Coronary artery disease Other   . Stroke Other   . Diabetes Other   . Hypertension Other   . Heart failure Other    Past Surgical History:  Procedure Laterality Date  . ABDOMINAL HYSTERECTOMY    . APPENDECTOMY     . BACK SURGERY     10 spine surgeries since 1988.  Marland Kitchen CARPAL TUNNEL RELEASE Right 04/07/2015   Procedure: RIGHT CARPAL TUNNEL RELEASE;  Surgeon: Daryll Brod, [name];  Location: [place];  Service: Orthopedics;  Laterality: Right;  . CARPAL TUNNEL RELEASE Left 09/29/2015   Procedure: CARPAL TUNNEL RELEASE LEFT ;  Surgeon: Daryll Brod, [name];  Location: [place];  Service: Orthopedics;  Laterality: Left;  . CHOLECYSTECTOMY    . CYSTOSCOPY W/ STONE MANIPULATION    . CYSTOSCOPY W/ URETERAL STENT PLACEMENT    . POSTERIOR CERVICAL FUSION/FORAMINOTOMY N/A 07/01/2014   Procedure: RIGHT C5-6 and C6-7 FORAMINOTOMY;  Surgeon: Jessy Oto, [name];  Location: [place];  Service: Orthopedics;  Laterality: N/A;  . TONSILLECTOMY    . ULNAR NERVE TRANSPOSITION Right 04/07/2015   Procedure: DECOMPRESSION  ULNAR NERVE RIGHT ELBOW;  Surgeon: Daryll Brod, [name];  Location: [place];  Service: Orthopedics;  Laterality: Right;  .  ULNAR NERVE TRANSPOSITION Left 09/29/2015   Procedure: DECOMPRESSION  ULNAR NERVE LEFT ELBOW;  Surgeon: Daryll Brod, [name];  Location: [place];  Service: Orthopedics;  Laterality: Left;   Social History   Occupational History  . Retired Cabin crew    Social History Main Topics  . Smoking status: Current Every Day Smoker    Packs/day: 0.50    Years: 43.00    Types: Cigarettes  . Smokeless tobacco: Never Used     Comment: quit early 02/2016  . Alcohol use No  . Drug use: No  . Sexual activity: Not Currently

## 2017-03-10 NOTE — Patient Instructions (Signed)

## 2017-03-10 NOTE — Progress Notes (Deleted)
Right hip and groin pain. Stinging and burning down leg to mid thigh.

## 2017-03-13 MED ORDER — TRIAMCINOLONE ACETONIDE 40 MG/ML IJ SUSP
80.0000 mg | INTRAMUSCULAR | Status: AC | PRN
  Administered 2017-03-10: 80 mg via INTRA_ARTICULAR

## 2017-03-13 MED ORDER — BUPIVACAINE HCL 0.25 % IJ SOLN
4.0000 mL | INTRAMUSCULAR | Status: AC | PRN
  Administered 2017-03-10: 4 mL via INTRA_ARTICULAR

## 2017-03-13 MED ORDER — LIDOCAINE HCL 2 % IJ SOLN
4.0000 mL | INTRAMUSCULAR | Status: AC | PRN
  Administered 2017-03-10: 4 mL

## 2017-03-16 ENCOUNTER — Ambulatory Visit (INDEPENDENT_AMBULATORY_CARE_PROVIDER_SITE_OTHER): Payer: Medicare Other | Admitting: Family Medicine

## 2017-03-16 ENCOUNTER — Encounter: Payer: Self-pay | Admitting: Family Medicine

## 2017-03-16 ENCOUNTER — Encounter: Payer: Self-pay | Admitting: Licensed Clinical Social Worker

## 2017-03-16 ENCOUNTER — Ambulatory Visit: Payer: Medicare Other | Admitting: Family Medicine

## 2017-03-16 DIAGNOSIS — F4310 Post-traumatic stress disorder, unspecified: Secondary | ICD-10-CM

## 2017-03-16 MED ORDER — ZOLPIDEM TARTRATE 5 MG PO TABS: 5.0000 mg | ORAL_TABLET | Freq: Every evening | ORAL | 1 refills | Status: DC | PRN

## 2017-03-16 NOTE — Patient Instructions (Signed)
The safest long term thing for your hip pain would be tylenol/acetomenophen Either 2 regular strength tylenol 4 times per day or 2 extra strength three times a day.   I will give you an Rx for a sleeping pill.  This is only temporary.   I will introduce you to our behavioral medicine person.

## 2017-03-16 NOTE — Assessment & Plan Note (Signed)
Mild and mainly manefest by insomnia.  Will Rx with ambien, only a short term med.  If needs longer term, will switch to SSRI.  Also involve integrated care.  I will FU in 2-3 weeks.
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
## 2017-03-16 NOTE — Progress Notes (Signed)
Opened in error

## 2017-03-23 ENCOUNTER — Ambulatory Visit: Payer: Medicare Other

## 2017-04-04 ENCOUNTER — Telehealth: Payer: Self-pay | Admitting: Licensed Clinical Social Worker

## 2017-04-04 NOTE — Progress Notes (Addendum)
[facility] f/u phone call to patient reference interventions discussed during joint visit with PCP. Patient was unsure of following up with integrated care when leaving the office.  She scheduled an appointment but cancelled it due to a family emergency.  Patient reports she is doing better, however would like a f/u appointment with [facility] prior to her office visit with Dr. Andria Frames.   Patient appreciative of the call.  Plan:  1. Patient will call the office and schedule a [facility] f/u appointment.   Casimer Lanius, LCSW Licensed Clinical Social Worker [place]   [phone] 10:32 AM

## 2017-04-07 ENCOUNTER — Encounter: Payer: Self-pay | Admitting: Student

## 2017-04-07 ENCOUNTER — Emergency Department (HOSPITAL_COMMUNITY)
Admission: EM | Admit: 2017-04-07 | Discharge: 2017-04-07 | Disposition: A | Discharge status: Home / Self Care | Payer: Medicare Other | Attending: Emergency Medicine | Admitting: Emergency Medicine

## 2017-04-07 ENCOUNTER — Emergency Department (HOSPITAL_COMMUNITY): Payer: Medicare Other

## 2017-04-07 ENCOUNTER — Encounter (HOSPITAL_COMMUNITY): Payer: Self-pay

## 2017-04-07 ENCOUNTER — Ambulatory Visit (INDEPENDENT_AMBULATORY_CARE_PROVIDER_SITE_OTHER): Payer: Medicare Other | Admitting: Student

## 2017-04-07 VITALS — BP 118/80 | HR 100 | Temp 98.5°F | Wt 176.0 lb

## 2017-04-07 DIAGNOSIS — Z79899 Other long term (current) drug therapy: Secondary | ICD-10-CM | POA: Diagnosis not present

## 2017-04-07 DIAGNOSIS — R300 Dysuria: Secondary | ICD-10-CM | POA: Diagnosis not present

## 2017-04-07 DIAGNOSIS — Z7902 Long term (current) use of antithrombotics/antiplatelets: Secondary | ICD-10-CM | POA: Insufficient documentation

## 2017-04-07 DIAGNOSIS — E039 Hypothyroidism, unspecified: Secondary | ICD-10-CM | POA: Insufficient documentation

## 2017-04-07 DIAGNOSIS — R350 Frequency of micturition: Secondary | ICD-10-CM | POA: Diagnosis not present

## 2017-04-07 DIAGNOSIS — R1030 Lower abdominal pain, unspecified: Secondary | ICD-10-CM

## 2017-04-07 DIAGNOSIS — Z8673 Personal history of transient ischemic attack (TIA), and cerebral infarction without residual deficits: Secondary | ICD-10-CM | POA: Insufficient documentation

## 2017-04-07 DIAGNOSIS — F17210 Nicotine dependence, cigarettes, uncomplicated: Secondary | ICD-10-CM | POA: Insufficient documentation

## 2017-04-07 DIAGNOSIS — I10 Essential (primary) hypertension: Secondary | ICD-10-CM | POA: Insufficient documentation

## 2017-04-07 DIAGNOSIS — R109 Unspecified abdominal pain: Secondary | ICD-10-CM

## 2017-04-07 DIAGNOSIS — Z7982 Long term (current) use of aspirin: Secondary | ICD-10-CM | POA: Insufficient documentation

## 2017-04-07 DIAGNOSIS — K5792 Diverticulitis of intestine, part unspecified, without perforation or abscess without bleeding: Secondary | ICD-10-CM | POA: Insufficient documentation

## 2017-04-07 LAB — URINALYSIS, ROUTINE W REFLEX MICROSCOPIC
BILIRUBIN URINE: NEGATIVE
Glucose, UA: NEGATIVE mg/dL
HGB URINE DIPSTICK: NEGATIVE
KETONES UR: NEGATIVE mg/dL
Nitrite: NEGATIVE
PROTEIN: NEGATIVE mg/dL
Specific Gravity, Urine: 1.026 (ref 1.005–1.030)
pH: 5 (ref 5.0–8.0)

## 2017-04-07 LAB — COMPREHENSIVE METABOLIC PANEL
ALT: 25 U/L (ref 14–54)
AST: 27 U/L (ref 15–41)
Albumin: 3.6 g/dL (ref 3.5–5.0)
Alkaline Phosphatase: 96 U/L (ref 38–126)
Anion gap: 8 (ref 5–15)
BUN: 11 mg/dL (ref 6–20)
CHLORIDE: 105 mmol/L (ref 101–111)
CO2: 24 mmol/L (ref 22–32)
Calcium: 10.9 mg/dL — ABNORMAL HIGH (ref 8.9–10.3)
Creatinine, Ser: 0.59 mg/dL (ref 0.44–1.00)
GFR calc Af Amer: >60 mL/min (ref >60)
Glucose, Bld: 129 mg/dL — ABNORMAL HIGH (ref 65–99)
POTASSIUM: 3.9 mmol/L (ref 3.5–5.1)
Sodium: 137 mmol/L (ref 135–145)
Total Bilirubin: 0.5 mg/dL (ref 0.3–1.2)
Total Protein: 6.8 g/dL (ref 6.5–8.1)

## 2017-04-07 LAB — POCT URINALYSIS DIP (MANUAL ENTRY)
BILIRUBIN UA: NEGATIVE mg/dL
Blood, UA: NEGATIVE
GLUCOSE UA: NEGATIVE mg/dL
LEUKOCYTES UA: NEGATIVE
NITRITE UA: NEGATIVE
Protein Ur, POC: NEGATIVE mg/dL
Spec Grav, UA: >=1.03 — AB (ref 1.010–1.025)
Urobilinogen, UA: 0.2 E.U./dL
pH, UA: 6 (ref 5.0–8.0)

## 2017-04-07 LAB — LIPASE, BLOOD: LIPASE: 20 U/L (ref 11–51)

## 2017-04-07 LAB — CBC
HEMATOCRIT: 43 % (ref 36.0–46.0)
HEMOGLOBIN: 13.9 g/dL (ref 12.0–15.0)
MCH: 29.4 pg (ref 26.0–34.0)
MCHC: 32.3 g/dL (ref 30.0–36.0)
MCV: 90.9 fL (ref 78.0–100.0)
Platelets: 287 10*3/uL (ref 150–400)
RBC: 4.73 MIL/uL (ref 3.87–5.11)
RDW: 13.1 % (ref 11.5–15.5)
WBC: 12.6 10*3/uL — AB (ref 4.0–10.5)

## 2017-04-07 MED ORDER — SODIUM CHLORIDE 0.9 % IV BOLUS (SEPSIS)
1000.0000 mL | Freq: Once | INTRAVENOUS | Status: AC
  Administered 2017-04-07: 1000 mL via INTRAVENOUS

## 2017-04-07 MED ORDER — CIPROFLOXACIN HCL 500 MG PO TABS: 500.0000 mg | ORAL_TABLET | Freq: Two times a day (BID) | ORAL | 0 refills | Status: AC

## 2017-04-07 MED ORDER — METRONIDAZOLE 500 MG PO TABS: 500.0000 mg | ORAL_TABLET | Freq: Two times a day (BID) | ORAL | 0 refills | Status: AC

## 2017-04-07 MED ORDER — ONDANSETRON HCL 4 MG/2ML IJ SOLN
4.0000 mg | Freq: Once | INTRAMUSCULAR | Status: AC
  Administered 2017-04-07: 4 mg via INTRAVENOUS
  Filled 2017-04-07: qty 2

## 2017-04-07 NOTE — Assessment & Plan Note (Addendum)
History and exam concerning for diverticulitis. She has a prior history of diverticulitis 2.  She has chills, tenderness to palpation and rebound tenderness over RLQ and LLQ. UA negative for UTI. She has right lumbar paraspinal muscle tenderness versus CVA tenderness. Low suspicion for gynecologic etiology with complete hysterectomy without vaginal discharge or vaginal bleeding.  Will send patient to ED for further evaluation and management.  Precepted patient with Dr. Brita Romp who was the preceptor for the day.

## 2017-04-07 NOTE — ED Notes (Signed)
Pt requesting to know how much longer until she goes to a room, pt advised she still has a few people ahead of her, encouraged to wait to see MD. Pt agreed. NAD noted

## 2017-04-07 NOTE — Patient Instructions (Signed)
It was great seeing you today! We have addressed the following issues today 1. Abdominal pain: I'm concerned your abdominal pain could be due to diverticulitis. Recommend you go to ED and get a full evaluation for this.  If we did any lab work today, and the results require attention, either me or my nurse will get in touch with you. If everything is normal, you will get a letter in mail and a message via . If you don't hear from [place] in two weeks, please give [place] a call. Otherwise, we look forward to seeing you again at your next visit. If you have any questions or concerns before then, please call the clinic at [phone].  Please bring all your medications to every doctors visit  Sign up for My Chart to have easy access to your labs results, and communication with your Primary care physician.    Please check-out at the front desk before leaving the clinic.    Take Care,   Dr. Cyndia Skeeters

## 2017-04-07 NOTE — ED Provider Notes (Signed)
[facility] DEPT Provider Note   CSN: [id number] Arrival date & time: 04/07/17  1523     History   Chief Complaint Chief Complaint  Patient presents with  . Abdominal Pain  . Dysuria    HPI Danielle [name] is a 61 y.o. female.   Abdominal Pain   This is a new problem. Episode onset: 5 days. The problem occurs constantly. The problem has not changed since onset.The pain is located in the LLQ and RLQ (Intermittently sharp). The quality of the pain is dull. The pain is at a severity of 4/10. The pain is moderate. Associated symptoms include fever ( Subjective fever and chills 5 days ago, resolved.), nausea and constipation ( Last bowel movement 5 days ago, hard stool and straining). Pertinent negatives include anorexia, diarrhea, flatus, melena, vomiting, dysuria, frequency, hematuria and myalgias. Associated symptoms comments: Lower abdominal pain when urinating. Nothing aggravates the symptoms. Nothing relieves the symptoms. Past medical history comments: Diverticulitis.    Past Medical History:  Diagnosis Date  . 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 Cervical spondylosis without myelopathy 09/12/2013  . Complication of anesthesia   . Diabetes mellitus without complication (HCC)    diet controlled -no meds  . Diverticulitis   . Dyslipidemia   . Family history of anesthesia complication    Mother - N/V  . Gastroesophageal reflux disease    8./25/15- no problem  . Glucose intolerance (pre-diabetes)   . Heart murmur    nothing to be concerned  . History of blood transfusion   . History of hiatal hernia   . History of tobacco abuse   . Hypertension   . Hypothyroidism   . Kidney stone   . Obesity   . Oral thrush    after back surgeries  . PONV (postoperative nausea and vomiting)   . Stroke [facility])    TIA X 2-3; last in 2013  . Tachycardia     Patient Active Problem List   Diagnosis Date Noted  . PTSD (post-traumatic stress disorder) 03/16/2017  . Vitamin D deficiency  02/09/2017    Class: Chronic  . Chest pain 02/23/2016  . Disorder of bone 01/12/2016  . Lower abdominal pain 02/20/2015  . Abdominal pain, epigastric 12/10/2014  . Spinal stenosis in cervical region 07/01/2014  . Osteopenia 01/22/2014  . Pain in joint, ankle and foot 12/27/2013  . Diverticulitis large intestine w/o perforation or abscess w/o bleeding 07/25/2013  . TIA (transient ischemic attack) 04/20/2011  . DEPRESSION 03/12/2010  . OBESITY 10/08/2009  . CYST AND PSEUDOCYST OF PANCREAS 09/21/2009  . HYPERCHOLESTEROLEMIA 12/10/2008  . ROTATOR CUFF SYNDROME 12/10/2008  . COLONIC POLYPS, HYPERPLASTIC 05/02/2008  . Essential hypertension 09/17/2007  . Palpitations 09/17/2007  . Diabetes mellitus type 2, controlled, without complications ([place]) 35/32/9924  . Hypothyroidism 01/04/2007  . HYPERCALCEMIA 01/04/2007  . Tobacco abuse 01/04/2007  . CARPAL TUNNEL SYNDROME 01/04/2007  . GASTROESOPHAGEAL REFLUX, NO ESOPHAGITIS 01/04/2007  . CERVICAL SPINE DISORDER, NOS 01/04/2007  . BACK PAIN W/RADIATION, UNSPECIFIED 01/04/2007    Past Surgical History:  Procedure Laterality Date  . ABDOMINAL HYSTERECTOMY    . APPENDECTOMY    . BACK SURGERY     10 spine surgeries since 1988.  Marland Kitchen CARPAL TUNNEL RELEASE Right 04/07/2015   Procedure: RIGHT CARPAL TUNNEL RELEASE;  Surgeon: Daryll Brod, MD;  Location: [place];  Service: Orthopedics;  Laterality: Right;  . CARPAL TUNNEL RELEASE Left 09/29/2015   Procedure: CARPAL TUNNEL RELEASE LEFT ;  Surgeon:  Daryll Brod, MD;  Location: [place];  Service: Orthopedics;  Laterality: Left;  . CHOLECYSTECTOMY    . CYSTOSCOPY W/ STONE MANIPULATION    . CYSTOSCOPY W/ URETERAL STENT PLACEMENT    . POSTERIOR CERVICAL FUSION/FORAMINOTOMY N/A 07/01/2014   Procedure: RIGHT C5-6 and C6-7 FORAMINOTOMY;  Surgeon: Jessy Oto, MD;  Location: [place];  Service: Orthopedics;  Laterality: N/A;  . TONSILLECTOMY    . ULNAR NERVE TRANSPOSITION  Right 04/07/2015   Procedure: DECOMPRESSION  ULNAR NERVE RIGHT ELBOW;  Surgeon: Daryll Brod, MD;  Location: [place];  Service: Orthopedics;  Laterality: Right;  . ULNAR NERVE TRANSPOSITION Left 09/29/2015   Procedure: DECOMPRESSION  ULNAR NERVE LEFT ELBOW;  Surgeon: Daryll Brod, MD;  Location: [place];  Service: Orthopedics;  Laterality: Left;    OB History    No data available       Home Medications    Prior to Admission medications   Medication Sig Start Date End Date Taking? Authorizing Provider  acetaminophen (TYLENOL) 500 MG tablet Take 1,000 mg by mouth every 6 (six) hours as needed for headache (pain).   Yes [provider]  aspirin EC 81 MG tablet Take 81 mg by mouth daily.    Yes [provider]  atorvastatin (LIPITOR) 80 MG tablet take 1 tablet by mouth daily AT 6:00 PM Patient taking differently: take 1 tablet by mouth daily 08/16/16  Yes Hensel, Jamal Collin, MD  clopidogrel (PLAVIX) 75 MG tablet take 1 tablet by mouth once daily Patient taking differently: take 1 tablet by mouth once daily at bedtime 08/16/16  Yes Hensel, Jamal Collin, MD  levothyroxine (SYNTHROID, LEVOTHROID) 200 MCG tablet take 1 tablet by mouth once daily before BREAKFAST Patient taking differently: take 1 tablet by mouth once daily at bedtime 09/21/16  Yes Hensel, Jamal Collin, MD  metoprolol succinate (TOPROL-XL) 100 MG 24 hr tablet Take 2 tablets (200 mg total) by mouth daily. Take with or immediately following a meal. Patient taking differently: Take 100 mg by mouth See admin instructions. Take with or immediately following a meal. - take 1 tablet (100 mg) by mouth daily with breakfast and late lunch 02/23/16  Yes Hensel, Jamal Collin, MD  nitroGLYCERIN (NITROSTAT) 0.4 MG SL tablet Place 1 tablet (0.4 mg total) under the tongue every 5 (five) minutes as needed for chest pain. 02/23/16  Yes Hensel, Jamal Collin, MD  Vitamin D, Ergocalciferol, (DRISDOL) 50000 units CAPS  capsule Take 1 capsule (50,000 Units total) by mouth every 7 (seven) days. Patient taking differently: Take 50,000 Units by mouth every Sunday.  02/09/17  Yes Jessy Oto, MD  celecoxib (CELEBREX) 100 MG capsule Take 1 capsule (100 mg total) by mouth 2 (two) times daily. Patient not taking: Reported on 04/07/2017 03/02/17   Jessy Oto, MD  ciprofloxacin (CIPRO) 500 MG tablet Take 1 tablet (500 mg total) by mouth every 12 (twelve) hours. 04/07/17 04/14/17  Nathaniel Man, MD  metroNIDAZOLE (FLAGYL) 500 MG tablet Take 1 tablet (500 mg total) by mouth 2 (two) times daily. 04/07/17 04/14/17  Nathaniel Man, MD  zolpidem (AMBIEN) 5 MG tablet Take 1 tablet (5 mg total) by mouth at bedtime as needed for sleep. Patient not taking: Reported on 04/07/2017 03/16/17   [name], MD    Family History Family History  Problem Relation Age of Onset  . Heart attack Father   . Cancer Father   . Diabetes Father   . Stroke  Mother   . Diabetes Mother   . Cancer Mother   . Heart attack Brother   . Cancer Other   . Coronary artery disease Other   . Stroke Other   . Diabetes Other   . Hypertension Other   . Heart failure Other     Social History Social History  Substance Use Topics  . Smoking status: Current Every Day Smoker    Packs/day: 0.50    Years: 43.00    Types: Cigarettes  . Smokeless tobacco: Never Used     Comment: quit early 02/2016  . Alcohol use No     Allergies   Ivp dye [iodinated diagnostic agents]; Cephalexin; and Codeine   Review of Systems Review of Systems  Constitutional: Positive for fever ( Subjective fever and chills 5 days ago, resolved.). Negative for appetite change.  HENT: Negative for congestion.   Respiratory: Negative for cough, chest tightness and shortness of breath.   Cardiovascular: Negative for chest pain.  Gastrointestinal: Positive for abdominal pain, constipation ( Last bowel movement 5 days ago, hard stool and straining) and nausea. Negative for  anorexia, blood in stool, diarrhea, flatus, melena and vomiting.  Genitourinary: Negative for dysuria, frequency and hematuria.  Musculoskeletal: Negative for back pain and myalgias.  Skin: Negative for rash.  Neurological: Negative for dizziness, seizures, weakness and light-headedness.  Psychiatric/Behavioral: Negative for behavioral problems.     Physical Exam Updated Vital Signs BP 127/80   Pulse 74   Temp 98.6 F (37 C) (Oral)   Resp 15   Ht 5' 5.5" (1.664 m)   Wt 79.8 kg (176 lb)   SpO2 98%   BMI 28.84 kg/m   Physical Exam  Constitutional: She is oriented to person, place, and time. She appears well-developed and well-nourished.  HENT:  Head: Atraumatic.  Mouth/Throat: Oropharynx is clear and moist.  Eyes: Conjunctivae and EOM are normal.  Neck: Normal range of motion. Neck supple.  Cardiovascular: Normal rate, regular rhythm, normal heart sounds and intact distal pulses.   Pulmonary/Chest: Effort normal and breath sounds normal. No respiratory distress.  Abdominal: She exhibits no distension and no mass. There is tenderness ( Right and left lower quadrant (right > left)). There is no rebound and no guarding.  Musculoskeletal: Normal range of motion.  Neurological: She is alert and oriented to person, place, and time.  Skin: Skin is warm. Capillary refill takes less than 2 seconds. No pallor.  Psychiatric: She has a normal mood and affect.     ED Treatments / Results  Labs (all labs ordered are listed, but only abnormal results are displayed) Labs Reviewed  COMPREHENSIVE METABOLIC PANEL - Abnormal; Notable for the following:       Result Value   Glucose, Bld 129 (*)    Calcium 10.9 (*)    All other components within normal limits  CBC - Abnormal; Notable for the following:    WBC 12.6 (*)    All other components within normal limits  URINALYSIS, ROUTINE W REFLEX MICROSCOPIC - Abnormal; Notable for the following:    APPearance HAZY (*)    Leukocytes, UA TRACE  (*)    Bacteria, UA FEW (*)    Squamous Epithelial / LPF 0-5 (*)    All other components within normal limits  URINE CULTURE  LIPASE, BLOOD    EKG  EKG Interpretation None       Radiology Ct Abdomen Pelvis Wo Contrast  Result Date: 04/07/2017 CLINICAL DATA:  Acute onset of lower  abdominal pain and back pain. Initial encounter. EXAM: CT ABDOMEN AND PELVIS WITHOUT CONTRAST TECHNIQUE: Multidetector CT imaging of the abdomen and pelvis was performed following the standard protocol without IV contrast. COMPARISON:  CT of the abdomen and pelvis performed 02/16/2015 FINDINGS: Lower chest: The visualized lung bases are grossly clear. The visualized portions of the mediastinum are unremarkable. Hepatobiliary: The liver is unremarkable in appearance. The patient is status post cholecystectomy, with clips noted at the gallbladder fossa. The common bile duct remains normal in caliber. Pancreas: The pancreas is within normal limits. Spleen: The spleen is unremarkable in appearance. Adrenals/Urinary Tract: Bilateral adrenal adenomas are again noted, measuring 3.9 cm on the right and 1.7 cm on the left. There is calcification about the right adrenal adenoma. These have increased in size from 2016. A nonobstructing 3 mm stone is noted at the upper pole of the right kidney. The kidneys are otherwise unremarkable. There is no evidence of hydronephrosis. No obstructing ureteral stones are identified. No perinephric stranding is seen. Stomach/Bowel: The stomach is unremarkable in appearance. The small bowel is within normal limits. The patient is status post appendectomy. Focal soft tissue inflammation is noted at the mid sigmoid colon, with underlying inflamed diverticula, compatible with mild acute diverticulitis. There is no evidence of perforation or abscess formation at this time. Vascular/Lymphatic: Mild scattered calcification is seen along the distal abdominal aorta. No retroperitoneal or pelvic sidewall  lymphadenopathy is seen. Visualized venous structures are grossly unremarkable. Reproductive: The bladder is mildly distended and grossly unremarkable. The patient is status post hysterectomy. No suspicious adnexal masses are seen. Other: No additional soft tissue abnormalities are seen. Musculoskeletal: No acute osseous abnormalities are identified. The patient is status post lumbar spinal fusion at L2-L3, and interbody spacers are seen at L3-L4 and L4-L5, with underlying decompression. Vacuum phenomenon and endplate sclerotic change are noted at L1-L2. The visualized musculature is unremarkable in appearance. IMPRESSION: 1. Focal soft tissue inflammation at the mid sigmoid colon, with underlying inflamed diverticula, compatible with mild acute diverticulitis. No evidence of perforation or abscess formation at this time. 2. Mild scattered aortic atherosclerosis. 3. Increased size of bilateral benign adrenal adenomas, measuring 3.9 cm on the right and 1.7 cm on the left. 4. Nonobstructing 3 mm stone at the upper pole of the right kidney. 5. Postoperative change and degenerative change along the lumbar spine. Electronically Signed   By: Garald Balding M.D.   On: 04/07/2017 22:52    Procedures Procedures (including critical care time)  Medications Ordered in ED Medications  sodium chloride 0.9 % bolus 1,000 mL (0 mLs Intravenous Stopped 04/07/17 2354)  ondansetron (ZOFRAN) injection 4 mg (4 mg Intravenous Given 04/07/17 2054)     Initial Impression / Assessment and Plan / ED Course  I have reviewed the triage vital signs and the nursing notes.  Pertinent labs & imaging results that were available during my care of the patient were reviewed by me and considered in my medical decision making (see chart for details).     Patient is a 61 year old female past medical history significant for prior diverticulitis, who presents to the emergency department with a 5 day history of bilateral lower abdominal pain  associated with constipation and nausea. On arrival no acute distress, not ill appearing. Afebrile, hemodynamically stable. Tenderness to palpation of bilateral lower abdomen without signs of peritonitis.  Differential diagnosis concerning for diverticulitis, appendicitis, constipation. Lab work remarkable for leukocytosis. UA showed no signs of urinary tract infection. No significant electrolyte abnormalities.  CT A/P without contrast (contrast allergy) obtained, showed uncomplicated diverticulitis. Patient given IV fluids and antiemetics.  Patient is stable for discharge home. Given a prescription for Cipro and Flagyl. Discussed follow-up with primary care physician. Given strict return precautions to the emergency department for any worsening of symptoms. Patient expressed understanding, questions or concerns at time of discharge.  Final Clinical Impressions(s) / ED Diagnoses   Final diagnoses:  Diverticulitis    New Prescriptions Discharge Medication List as of 04/07/2017 11:15 PM    START taking these medications   Details  ciprofloxacin (CIPRO) 500 MG tablet Take 1 tablet (500 mg total) by mouth every 12 (twelve) hours., Starting Fri 04/07/2017, Until Fri 04/14/2017, Print    metroNIDAZOLE (FLAGYL) 500 MG tablet Take 1 tablet (500 mg total) by mouth 2 (two) times daily., Starting Fri 04/07/2017, Until Fri 04/14/2017, Print         Nathaniel Man, MD 04/08/17 Ofilia Neas    Carmin Muskrat, MD 04/09/17 [phone]

## 2017-04-07 NOTE — Progress Notes (Signed)
Subjective:    Danielle [name] is a 61 y.o. old female here   HPI Low abdominal pain: for three days. She describes the pain as achy and sometimes sharp. Also low back pain, right greater than left. Chills for three days as well. Denies fever. Admits nausea. Denies emesis, diarrhea, hematochezia or melena. Reports a very tiny small stool yesterday and this morning. Denies dysuria. Admits increased freq and hematuria. Denies vaginal bleeding or discharge. Had complete hysterectomy.   Patient had history of recurrent diverticulitis 2. She also has history of recurrent nephrolithiasis but she said this pain doesn't feel like that. She had cholecystectomy and appendectomy. She had complete hysterectomy.   PMH/Problem List: has COLONIC POLYPS, HYPERPLASTIC; Hypothyroidism; HYPERCHOLESTEROLEMIA; HYPERCALCEMIA; OBESITY; Tobacco abuse; DEPRESSION; CARPAL TUNNEL SYNDROME; Essential hypertension; GASTROESOPHAGEAL REFLUX, NO ESOPHAGITIS; CYST AND PSEUDOCYST OF PANCREAS; CERVICAL SPINE DISORDER, NOS; BACK PAIN W/RADIATION, UNSPECIFIED; ROTATOR CUFF SYNDROME; Palpitations; Diabetes mellitus type 2, controlled, without complications ([place]); TIA (transient ischemic attack); Diverticulitis large intestine w/o perforation or abscess w/o bleeding; Pain in joint, ankle and foot; Osteopenia; Spinal stenosis in cervical region; Abdominal pain, epigastric; Lower abdominal pain; Disorder of bone; Chest pain; Vitamin D deficiency; and PTSD (post-traumatic stress disorder) on her problem list.   has a past medical history of Brain TIA (3/13); Cervical spondylosis without myelopathy (09/12/2013); Complication of anesthesia; Diabetes mellitus without complication ([place]); Diverticulitis; Dyslipidemia; Family history of anesthesia complication; Gastroesophageal reflux disease; Glucose intolerance (pre-diabetes); Heart murmur; History of blood transfusion; History of hiatal hernia; History of tobacco abuse; Hypertension; Hypothyroidism; Kidney  stone; Obesity; Oral thrush; PONV (postoperative nausea and vomiting); Stroke [facility]); and Tachycardia.  FH:  Family History  Problem Relation Age of Onset  . Heart attack Father   . Cancer Father   . Diabetes Father   . Stroke Mother   . Diabetes Mother   . Cancer Mother   . Heart attack Brother   . Cancer Other   . Coronary artery disease Other   . Stroke Other   . Diabetes Other   . Hypertension Other   . Heart failure Other     [place] Social History  Substance Use Topics  . Smoking status: Current Every Day Smoker    Packs/day: 0.50    Years: 43.00    Types: Cigarettes  . Smokeless tobacco: Never Used     Comment: quit early 02/2016  . Alcohol use No    Review of Systems Review of systems negative except for pertinent positives and negatives in history of present illness above.     Objective:     Vitals:   04/07/17 1432  BP: 118/80  Pulse: 100  Temp: 98.5 F (36.9 C)  TempSrc: Oral  SpO2: 96%  Weight: 176 lb (79.8 kg)    Physical Exam GEN: appears to be in some discomfort from pain especially with movements CVS: RRR, nl S1&S2, no murmurs, no edema RESP: no IWOB, good air movement bilaterally, CTAB GI: BS present & normal, soft, no tenderness over RUQ. No Murphy sign.  Tenderness to palpation over RLQ and LLQ. Rebound tenderness over RLQ and LLQ MSK: Positive for suprapubic tenderness. Right CVA tenderness. She is also tenderness to palpation over the same area SKIN: no apparent skin lesion NEURO: alert and oiented appropriately, no gross defecits  PSYCH: euthymic mood with congruent affect    Assessment and Plan:  Lower abdominal pain History and exam concerning for diverticulitis. She has a prior history of diverticulitis 2.  She has chills, tenderness to palpation  and rebound tenderness over RLQ and LLQ. UA negative for UTI. She has right lumbar paraspinal muscle tenderness versus CVA tenderness. Low suspicion for gynecologic etiology with complete  hysterectomy without vaginal discharge or vaginal bleeding.  Will send patient to ED for further evaluation and management.  Precepted patient with Dr. Brita Romp who was the preceptor for the day.   Orders Placed This Encounter  Procedures  . POCT urinalysis dipstick    Return if symptoms worsen or fail to improve.  Mercy Riding, MD 04/07/17 Pager: [phone]

## 2017-04-07 NOTE — ED Triage Notes (Signed)
Pt complaining of abdominal pain x 3 days. Pt states some nausea, denies any emesis or diarrhea. Pt complaining of fever/chills at home. Pt denies any vaginal bleeding/discharge. Pt states painful urination.

## 2017-04-10 LAB — URINE CULTURE

## 2017-04-11 ENCOUNTER — Telehealth: Payer: Self-pay | Admitting: *Deleted

## 2017-04-11 NOTE — Progress Notes (Signed)
ED Antimicrobial Stewardship Positive Culture Follow Up   Danielle [name] is an 61 y.o. female who presented to [facility] on 04/07/2017 with a chief complaint of  Chief Complaint  Patient presents with  . Abdominal Pain  . Dysuria    Recent Results (from the past 720 hour(s))  Urine culture     Status: Abnormal   Collection Time: 04/07/17  7:35 PM  Result Value Ref Range Status   Specimen Description URINE, RANDOM  Final   Special Requests NONE  Final   Culture >=100,000 COLONIES/mL ESCHERICHIA COLI (A)  Final   Report Status 04/10/2017 FINAL  Final   Organism ID, Bacteria ESCHERICHIA COLI (A)  Final      Susceptibility   Escherichia coli - MIC*    AMPICILLIN >=32 RESISTANT Resistant     CEFAZOLIN <=4 SENSITIVE Sensitive     CEFTRIAXONE <=1 SENSITIVE Sensitive     CIPROFLOXACIN >=4 RESISTANT Resistant     GENTAMICIN <=1 SENSITIVE Sensitive     IMIPENEM <=0.25 SENSITIVE Sensitive     NITROFURANTOIN <=16 SENSITIVE Sensitive     TRIMETH/SULFA <=20 SENSITIVE Sensitive     AMPICILLIN/SULBACTAM >=32 RESISTANT Resistant     PIP/TAZO <=4 SENSITIVE Sensitive     Extended ESBL NEGATIVE Sensitive     * >=100,000 COLONIES/mL ESCHERICHIA COLI    [x]  Treated with ciprofloxacin, organism resistant to prescribed antimicrobial  New antibiotic prescription: Continue treatment with cipro and Flagyl and start Bactrim DS tablet PO BID x 7 days   ED Provider: Martinique Russon PA-C   Reginia Naas 04/11/2017, 9:26 AM Infectious Diseases Pharmacist Phone# [phone]

## 2017-04-11 NOTE — Telephone Encounter (Signed)
Post ED Visit - Positive Culture Follow-up: Unsuccessful Patient Follow-up  Culture assessed and recommendations reviewed by:  [x]  Elenor Quinones, Pharm.D. []  Heide Guile, Pharm.D., BCPS AQ-ID []  Parks Neptune, Pharm.D., BCPS []  Alycia Rossetti, Pharm.D., BCPS []  [place], Pharm.D., BCPS, AAHIVP []  Legrand Como, Pharm.D., BCPS, AAHIVP []  Salome Arnt, PharmD, BCPS []  Dimitri Ped, PharmD, BCPS []  Vincenza Hews, PharmD, BCPS  Positive urine culture  []  Patient discharged without antimicrobial prescription and treatment is now indicated [x]  Organism is resistant to prescribed ED discharge antimicrobial []  Patient with positive blood cultures   Unable to contact patient after 3 attempts, letter will be sent to address on file  Ardeen Fillers 04/11/2017, 11:00 AM

## 2017-04-11 NOTE — Telephone Encounter (Signed)
Contacted by pt in response to message left at home.  Bactrim DS PO BID x 7 days called to [facility], [address] [phone]

## 2017-04-19 ENCOUNTER — Ambulatory Visit (INDEPENDENT_AMBULATORY_CARE_PROVIDER_SITE_OTHER): Payer: Medicare Other | Admitting: Family Medicine

## 2017-04-19 ENCOUNTER — Encounter: Payer: Self-pay | Admitting: Family Medicine

## 2017-04-19 VITALS — BP 136/96 | HR 94 | Temp 98.3°F | Ht 66.0 in | Wt 177.4 lb

## 2017-04-19 DIAGNOSIS — N390 Urinary tract infection, site not specified: Secondary | ICD-10-CM | POA: Diagnosis not present

## 2017-04-19 DIAGNOSIS — B370 Candidal stomatitis: Secondary | ICD-10-CM | POA: Insufficient documentation

## 2017-04-19 DIAGNOSIS — E119 Type 2 diabetes mellitus without complications: Secondary | ICD-10-CM | POA: Diagnosis not present

## 2017-04-19 DIAGNOSIS — K5732 Diverticulitis of large intestine without perforation or abscess without bleeding: Secondary | ICD-10-CM | POA: Diagnosis not present

## 2017-04-19 DIAGNOSIS — F4310 Post-traumatic stress disorder, unspecified: Secondary | ICD-10-CM

## 2017-04-19 DIAGNOSIS — R109 Unspecified abdominal pain: Secondary | ICD-10-CM | POA: Diagnosis not present

## 2017-04-19 LAB — POCT URINALYSIS DIP (MANUAL ENTRY)
Bilirubin, UA: NEGATIVE
Glucose, UA: NEGATIVE mg/dL
Ketones, POC UA: NEGATIVE mg/dL
NITRITE UA: NEGATIVE
PH UA: 6 (ref 5.0–8.0)
Protein Ur, POC: NEGATIVE mg/dL
RBC UA: NEGATIVE
Spec Grav, UA: 1.025 (ref 1.010–1.025)
UROBILINOGEN UA: 0.2 U/dL

## 2017-04-19 LAB — POCT UA - MICROSCOPIC ONLY

## 2017-04-19 LAB — POCT GLYCOSYLATED HEMOGLOBIN (HGB A1C): Hemoglobin A1C: 6.5

## 2017-04-19 MED ORDER — NYSTATIN 100000 UNIT/ML MT SUSP: 5.0000 mL | Freq: Four times a day (QID) | OROMUCOSAL | 0 refills | Status: DC

## 2017-04-19 NOTE — Progress Notes (Signed)
[email or web address]

## 2017-04-19 NOTE — Patient Instructions (Addendum)
I am on the fence about more antibiotics. You will definitely need more if you still have a urine infection or if the pain gets worse.   For now, we will wait.  I will call with urine culture results. I sent in a prescription for thrush.  Call us back with the name of your eye doctor. See me in three months for a diabetes recheck.  Sooner if problems. A1C is good at 6.5.  No changes.

## 2017-04-20 NOTE — Assessment & Plan Note (Signed)
2nd to recent antibiotics.

## 2017-04-20 NOTE — Assessment & Plan Note (Signed)
Will culture and hold on antibiotics until I see that result.
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
## 2017-04-20 NOTE — Assessment & Plan Note (Signed)
Stable, diet controled

## 2017-04-20 NOTE — Assessment & Plan Note (Signed)
I am moderately concerned that she needs more antibiotics, but will not pull the trigger yet.

## 2017-04-20 NOTE — Assessment & Plan Note (Signed)
Nicely improving.

## 2017-04-23 LAB — URINE CULTURE

## 2017-04-24 ENCOUNTER — Other Ambulatory Visit: Payer: Self-pay | Admitting: Family Medicine

## 2017-04-24 DIAGNOSIS — B370 Candidal stomatitis: Secondary | ICD-10-CM

## 2017-04-24 MED ORDER — NYSTATIN 100000 UNIT/ML MT SUSP: 5.0000 mL | Freq: Four times a day (QID) | OROMUCOSAL | 0 refills | Status: DC

## 2017-04-24 MED ORDER — NITROFURANTOIN MACROCRYSTAL 50 MG PO CAPS: 50.0000 mg | ORAL_CAPSULE | Freq: Four times a day (QID) | ORAL | 0 refills | Status: DC

## 2017-04-24 NOTE — Progress Notes (Signed)
Still with thrush and some lower abd pressure.  Stopped by after had tried to call with results.  Will Rx enterococcus as real uncomplicated UTI and refill thrush.  She knows that there is still an element of doubt in her symptoms, (e.g. Does she have any lingering colitis.)  Knows to stay in touch.

## 2017-06-01 ENCOUNTER — Encounter (INDEPENDENT_AMBULATORY_CARE_PROVIDER_SITE_OTHER): Payer: Self-pay | Admitting: Specialist

## 2017-06-01 ENCOUNTER — Ambulatory Visit (INDEPENDENT_AMBULATORY_CARE_PROVIDER_SITE_OTHER): Payer: Medicare Other | Admitting: Specialist

## 2017-06-01 VITALS — BP 146/98 | HR 78 | Ht 65.0 in | Wt 170.0 lb

## 2017-06-01 DIAGNOSIS — M546 Pain in thoracic spine: Secondary | ICD-10-CM

## 2017-06-01 DIAGNOSIS — R29898 Other symptoms and signs involving the musculoskeletal system: Secondary | ICD-10-CM | POA: Diagnosis not present

## 2017-06-01 MED ORDER — METHYLPREDNISOLONE 4 MG PO TBPK: ORAL_TABLET | ORAL | 0 refills | Status: DC

## 2017-06-01 NOTE — Progress Notes (Signed)
Office Visit Note   Patient: Danielle [name]           Date of Birth: September 09, 1956           MRN: [id number] Visit Date: 06/01/2017              Requested by: [name], MD [place], [place] [address] PCP: [name], MD   Assessment & Plan: Visit Diagnoses:  1. Acute right-sided thoracic back pain   2. Weakness of right leg   Increasing bilateral leg weakness, right lower thoracic pain with radiation around the right lower rib cage, Worrisome for a lower thoracic disc herniation. Myelogram and post myelogram CT scan is ordered to  Assess upper lumbar and thoracic spine for cord compression. Start medrol dose pak. Will call with report or  See back in 2 weeks.  Plan: Avoid frequent bending and stooping  No lifting greater than 10 lbs. May use ice or moist heat for pain. Weight loss is of benefit. Handicap license is approved.  Follow-Up Instructions: Return in about 2 weeks (around 06/15/2017).   Orders:  Orders Placed This Encounter  Procedures  . DG Myelogram 2+ Regions   Meds ordered this encounter  Medications  . methylPREDNISolone (MEDROL DOSEPAK) 4 MG TBPK tablet    Sig: Take as directed    Dispense:  21 tablet    Refill:  0      Procedures: No procedures performed   Clinical Data: No additional findings.   Subjective: Chief Complaint  Patient presents with  . Middle Back - Pain    61 year old female with history of multiple level lumbar laminectomies and fusions L2 to S1. She has proximal adjacent level DDD and bulging disc with stenosis that is of a mild to moderate degree. She began having onset of right sided lower thoracic pain with radiation about the right flank to the right  Anterior upper abdomen and lower anterior ribs. D8-12. No numbness or tingling, it's just a sharp pain that comes on with change of position to stand or to start to walk and with walking. No bowel or bladder difficulty. She had an episode of  diverticulosis about one month ago with pain mainly right lower abdomen.     Review of Systems  Constitutional: Positive for activity change. Negative for appetite change, chills, diaphoresis, fatigue, fever and unexpected weight change.  HENT: Negative for dental problem, ear discharge, ear pain, postnasal drip, rhinorrhea, sinus pain, sneezing, tinnitus, trouble swallowing and voice change.   Eyes: Negative for photophobia, pain, redness and itching.  Respiratory: Negative for apnea, cough, chest tightness, shortness of breath, wheezing and stridor.   Cardiovascular: Positive for chest pain and leg swelling. Negative for palpitations.  Gastrointestinal: Positive for nausea. Negative for abdominal distention, abdominal pain, blood in stool, constipation, diarrhea, rectal pain and vomiting.  Endocrine: Negative for cold intolerance, heat intolerance, polydipsia, polyphagia and polyuria.  Genitourinary: Negative for difficulty urinating, dyspareunia, dysuria, enuresis, flank pain, frequency, genital sores, hematuria, pelvic pain and urgency.  Musculoskeletal: Positive for back pain and gait problem. Negative for neck pain and neck stiffness.  Skin: Negative for color change, pallor, rash and wound.  Allergic/Immunologic: Negative for environmental allergies and food allergies.  Neurological: Positive for dizziness, weakness, light-headedness and numbness. Negative for tremors, seizures, syncope, speech difficulty and headaches.  Hematological: Negative for adenopathy. Does not bruise/bleed easily.  Psychiatric/Behavioral: Positive for sleep disturbance. Negative for agitation, behavioral problems, confusion, decreased concentration,  dysphoric mood, hallucinations, self-injury and suicidal ideas. The patient is not nervous/anxious and is not hyperactive.      Objective: Vital Signs: BP (!) 146/98 (BP Location: Left Arm, Patient Position: Sitting)   Pulse 78   Ht 5\' 5"  (1.651 m)   Wt 170 lb  (77.1 kg)   BMI 28.29 kg/m   Physical Exam  Constitutional: She is oriented to person, place, and time. She appears well-developed and well-nourished.  HENT:  Head: Normocephalic and atraumatic.  Eyes: Pupils are equal, round, and reactive to light. EOM are normal.  Neck: Normal range of motion. Neck supple.  Pulmonary/Chest: Effort normal and breath sounds normal.  Abdominal: Soft. Bowel sounds are normal.  Neurological: She is alert and oriented to person, place, and time.  Skin: Skin is warm and dry.  Psychiatric: She has a normal mood and affect. Her behavior is normal. Judgment and thought content normal.    Back Exam   Tenderness  The patient is experiencing tenderness in the lumbar.  Range of Motion  Extension: abnormal  Flexion: abnormal  Lateral Bend Right: abnormal  Lateral Bend Left: abnormal  Rotation Right: abnormal  Rotation Left: abnormal   Muscle Strength  Right Quadriceps:  4/5  Left Quadriceps:  5/5  Right Hamstrings:  4/5   Tests  Straight leg raise right: positive Straight leg raise left: negative  Reflexes  Patellar: normal Achilles:  Hyporeflexic abnormal Biceps: normal Babinski's sign: normal   Other  Toe Walk: abnormal Heel Walk: abnormal Sensation: decreased Gait: abnormal   Comments:  Right lower thoracic T8 to T12 pain.       Specialty Comments:  No specialty comments available.  Imaging: No results found.   PMFS History: Patient Active Problem List   Diagnosis Date Noted  . Vitamin D deficiency 02/09/2017    Priority: High    Class: Chronic  . Thrush 04/19/2017  . PTSD (post-traumatic stress disorder) 03/16/2017  . Chest pain 02/23/2016  . Disorder of bone 01/12/2016  . Lower abdominal pain 02/20/2015  . Abdominal pain, epigastric 12/10/2014  . Spinal stenosis in cervical region 07/01/2014  . Osteopenia 01/22/2014  . Pain in joint, ankle and foot 12/27/2013  . Diverticulitis large intestine w/o perforation or  abscess w/o bleeding 07/25/2013  . TIA (transient ischemic attack) 04/20/2011  . DEPRESSION 03/12/2010  . Urinary tract infection 03/12/2010  . OBESITY 10/08/2009  . CYST AND PSEUDOCYST OF PANCREAS 09/21/2009  . HYPERCHOLESTEROLEMIA 12/10/2008  . ROTATOR CUFF SYNDROME 12/10/2008  . COLONIC POLYPS, HYPERPLASTIC 05/02/2008  . Essential hypertension 09/17/2007  . Palpitations 09/17/2007  . Diabetes mellitus type 2, controlled, without complications ([place]) 69/67/8938  . Hypothyroidism 01/04/2007  . HYPERCALCEMIA 01/04/2007  . Tobacco abuse 01/04/2007  . CARPAL TUNNEL SYNDROME 01/04/2007  . GASTROESOPHAGEAL REFLUX, NO ESOPHAGITIS 01/04/2007  . CERVICAL SPINE DISORDER, NOS 01/04/2007  . BACK PAIN W/RADIATION, UNSPECIFIED 01/04/2007   Past Medical History:  Diagnosis Date  . 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 Cervical spondylosis without myelopathy 09/12/2013  . Complication of anesthesia   . Diabetes mellitus without complication (HCC)    diet controlled -no meds  . Diverticulitis   . Dyslipidemia   . Family history of anesthesia complication    Mother - N/V  . Gastroesophageal reflux disease    8./25/15- no problem  . Glucose intolerance (pre-diabetes)   . Heart murmur    nothing to be concerned  . History of blood transfusion   . History of hiatal hernia   .  History of tobacco abuse   . Hypertension   . Hypothyroidism   . Kidney stone   . Obesity   . Oral thrush    after back surgeries  . PONV (postoperative nausea and vomiting)   . Stroke [facility])    TIA X 2-3; last in 2013  . Tachycardia     Family History  Problem Relation Age of Onset  . Heart attack Father   . Cancer Father   . Diabetes Father   . Stroke Mother   . Diabetes Mother   . Cancer Mother   . Heart attack Brother   . Cancer Other   . Coronary artery disease Other   . Stroke Other   . Diabetes Other   . Hypertension Other   . Heart failure Other     Past Surgical History:  Procedure Laterality  Date  . ABDOMINAL HYSTERECTOMY    . APPENDECTOMY    . BACK SURGERY     10 spine surgeries since 1988.  Marland Kitchen CARPAL TUNNEL RELEASE Right 04/07/2015   Procedure: RIGHT CARPAL TUNNEL RELEASE;  Surgeon: Daryll Brod, MD;  Location: [place];  Service: Orthopedics;  Laterality: Right;  . CARPAL TUNNEL RELEASE Left 09/29/2015   Procedure: CARPAL TUNNEL RELEASE LEFT ;  Surgeon: Daryll Brod, MD;  Location: [place];  Service: Orthopedics;  Laterality: Left;  . CHOLECYSTECTOMY    . CYSTOSCOPY W/ STONE MANIPULATION    . CYSTOSCOPY W/ URETERAL STENT PLACEMENT    . POSTERIOR CERVICAL FUSION/FORAMINOTOMY N/A 07/01/2014   Procedure: RIGHT C5-6 and C6-7 FORAMINOTOMY;  Surgeon: Jessy Oto, MD;  Location: [place];  Service: Orthopedics;  Laterality: N/A;  . TONSILLECTOMY    . ULNAR NERVE TRANSPOSITION Right 04/07/2015   Procedure: DECOMPRESSION  ULNAR NERVE RIGHT ELBOW;  Surgeon: Daryll Brod, MD;  Location: [place];  Service: Orthopedics;  Laterality: Right;  . ULNAR NERVE TRANSPOSITION Left 09/29/2015   Procedure: DECOMPRESSION  ULNAR NERVE LEFT ELBOW;  Surgeon: Daryll Brod, MD;  Location: [place];  Service: Orthopedics;  Laterality: Left;   Social History   Occupational History  . Retired Cabin crew    Social History Main Topics  . Smoking status: Current Every Day Smoker    Packs/day: 0.50    Years: 43.00    Types: Cigarettes  . Smokeless tobacco: Never Used     Comment: quit early 02/2016  . Alcohol use No  . Drug use: No  . Sexual activity: Not Currently

## 2017-06-01 NOTE — Patient Instructions (Signed)
Avoid frequent bending and stooping  No lifting greater than 10 lbs. May use ice or moist heat for pain. Weight loss is of benefit. Handicap license is approved.   

## 2017-06-07 ENCOUNTER — Other Ambulatory Visit (INDEPENDENT_AMBULATORY_CARE_PROVIDER_SITE_OTHER): Payer: Self-pay | Admitting: Specialist

## 2017-06-07 DIAGNOSIS — R29898 Other symptoms and signs involving the musculoskeletal system: Secondary | ICD-10-CM

## 2017-06-07 DIAGNOSIS — M546 Pain in thoracic spine: Secondary | ICD-10-CM

## 2017-06-13 ENCOUNTER — Telehealth: Payer: Self-pay | Admitting: Radiology

## 2017-06-13 NOTE — Telephone Encounter (Signed)
Called 13 hour prep to [facility] on [address]. Instructions called and explained to pt.

## 2017-06-15 ENCOUNTER — Ambulatory Visit (INDEPENDENT_AMBULATORY_CARE_PROVIDER_SITE_OTHER): Payer: Medicare Other | Admitting: Specialist

## 2017-06-21 ENCOUNTER — Inpatient Hospital Stay: Admission: RE | Admit: 2017-06-21 | Payer: Medicare Other | Source: Ambulatory Visit

## 2017-06-21 ENCOUNTER — Other Ambulatory Visit: Payer: Medicare Other

## 2017-06-26 ENCOUNTER — Ambulatory Visit
Admission: RE | Admit: 2017-06-26 | Discharge: 2017-06-26 | Disposition: A | Discharge status: Home / Self Care | Payer: Medicare Other | Source: Ambulatory Visit | Attending: Specialist | Admitting: Specialist

## 2017-06-26 DIAGNOSIS — M546 Pain in thoracic spine: Secondary | ICD-10-CM

## 2017-06-26 DIAGNOSIS — R29898 Other symptoms and signs involving the musculoskeletal system: Secondary | ICD-10-CM

## 2017-06-26 MED ORDER — IOPAMIDOL (ISOVUE-M 300) INJECTION 61%: 10.0000 mL | Freq: Once | INTRAMUSCULAR | Status: DC | PRN

## 2017-06-26 MED ORDER — MEPERIDINE HCL 100 MG/ML IJ SOLN
75.0000 mg | Freq: Once | INTRAMUSCULAR | Status: AC
  Administered 2017-06-26: 75 mg via INTRAMUSCULAR

## 2017-06-26 MED ORDER — ONDANSETRON HCL 4 MG/2ML IJ SOLN
4.0000 mg | Freq: Once | INTRAMUSCULAR | Status: AC
  Administered 2017-06-26: 4 mg via INTRAMUSCULAR

## 2017-06-26 MED ORDER — DIAZEPAM 5 MG PO TABS
5.0000 mg | ORAL_TABLET | Freq: Once | ORAL | Status: AC
Start: 2017-06-26 — End: 2017-06-26
  Administered 2017-06-26: 5 mg via ORAL

## 2017-06-26 NOTE — Discharge Instructions (Signed)

## 2017-06-26 NOTE — Progress Notes (Signed)
States she has been off Plavix for the past 5 days.

## 2017-07-19 ENCOUNTER — Ambulatory Visit (INDEPENDENT_AMBULATORY_CARE_PROVIDER_SITE_OTHER): Payer: Medicare Other

## 2017-07-19 ENCOUNTER — Encounter (INDEPENDENT_AMBULATORY_CARE_PROVIDER_SITE_OTHER): Payer: Self-pay | Admitting: Orthopedic Surgery

## 2017-07-19 ENCOUNTER — Ambulatory Visit (INDEPENDENT_AMBULATORY_CARE_PROVIDER_SITE_OTHER): Payer: Medicare Other | Admitting: Orthopedic Surgery

## 2017-07-19 DIAGNOSIS — M25511 Pain in right shoulder: Secondary | ICD-10-CM | POA: Diagnosis not present

## 2017-07-19 DIAGNOSIS — M542 Cervicalgia: Secondary | ICD-10-CM

## 2017-07-19 DIAGNOSIS — G8929 Other chronic pain: Secondary | ICD-10-CM | POA: Diagnosis not present

## 2017-07-21 DIAGNOSIS — M25511 Pain in right shoulder: Secondary | ICD-10-CM | POA: Diagnosis not present

## 2017-07-21 MED ORDER — BUPIVACAINE HCL 0.5 % IJ SOLN
9.0000 mL | INTRAMUSCULAR | Status: AC | PRN
  Administered 2017-07-21: 9 mL via INTRA_ARTICULAR

## 2017-07-21 MED ORDER — METHYLPREDNISOLONE ACETATE 40 MG/ML IJ SUSP
40.0000 mg | INTRAMUSCULAR | Status: AC | PRN
  Administered 2017-07-21: 40 mg via INTRA_ARTICULAR

## 2017-07-21 MED ORDER — LIDOCAINE HCL 1 % IJ SOLN
5.0000 mL | INTRAMUSCULAR | Status: AC | PRN
  Administered 2017-07-21: 5 mL

## 2017-07-21 NOTE — Progress Notes (Signed)
Office Visit Note   Patient: Danielle [name]           Date of Birth: [birth year]           MRN: [id number] Visit Date: 07/19/2017 Requested by: [name], MD [place], [place] [address] PCP: [name], MD  Subjective: Chief Complaint  Patient presents with  . Arm Pain    HPI: Danielle [name] is a 61 year old patient with right shoulder pain.  She describes pain after an injury 3 weeks ago when she fell.  Once constant toothache pain radiating down to the elbow.  Also reports a little bit of scapular pain.  She's previously had foraminotomies neck surgery by Dr.nitka.  She reports some occasional numbness and tingling.              ROS: All systems reviewed are negative as they relate to the chief complaint within the history of present illness.  Patient denies  fevers or chills.   Assessment & Plan: Visit Diagnoses:  1. Neck pain   2. Right shoulder pain, unspecified chronicity   3. Chronic right shoulder pain     Plan: Impression is right shoulder pain likely rotator cuff tear.  Plan is MRI arthrogram to evaluate for rotator cuff tear.  Based on exam pre-high likelihood that she has some rotator cuff pathology.  I'll see her back after that study.  I do want to inject the shoulder today for pain relief.  Follow-Up Instructions: Return for after MRI.   Orders:  Orders Placed This Encounter  Procedures  . XR Cervical Spine 2 or 3 views  . XR Shoulder Right  . MR SHOULDER RIGHT W CONTRAST  . Arthrogram   No orders of the defined types were placed in this encounter.     Procedures: Large Joint Inj Date/Time: 07/21/2017 9:01 AM Performed by: Meredith Pel Authorized by: Meredith Pel   Consent Given by:  Patient Site marked: the procedure site was marked   Timeout: prior to procedure the correct patient, procedure, and site was verified   Indications:  Pain and diagnostic evaluation Location:  Shoulder Site:  R subacromial  bursa Prep: patient was prepped and draped in usual sterile fashion   Needle Size:  18 G Needle Length:  1.5 inches Approach:  Posterior Ultrasound Guidance: No   Fluoroscopic Guidance: No   Arthrogram: No   Medications:  5 mL lidocaine 1 %; 9 mL bupivacaine 0.5 %; 40 mg methylPREDNISolone acetate 40 MG/ML Aspiration Attempted: No   Patient tolerance:  Patient tolerated the procedure well with no immediate complications     Clinical Data: No additional findings.  Objective: Vital Signs: There were no vitals taken for this visit.  Physical Exam:   Constitutional: Patient appears well-developed HEENT:  Head: Normocephalic Eyes:EOM are normal Neck: Normal range of motion Cardiovascular: Normal rate Pulmonary/chest: Effort normal Neurologic: Patient is alert Skin: Skin is warm Psychiatric: Patient has normal mood and affect    Ortho Exam: Orthopedic exam demonstrates full active and passive range of motion of the left shoulder.  On the right shoulder patient has weakness to infraspinatus and supraspinatus testing.  Some coarse grinding is present on the right shoulder with passive range of motion which is not present on the left.  No masses lymph adenopathy skin changes bruising noted in the right shoulder region.  Motor sensory function to the hand is intact  Specialty Comments:  No specialty comments available.  Imaging: No  results found.   PMFS History: Patient Active Problem List   Diagnosis Date Noted  . Thrush 04/19/2017  . PTSD (post-traumatic stress disorder) 03/16/2017  . Vitamin D deficiency 02/09/2017    Class: Chronic  . Chest pain 02/23/2016  . Disorder of bone 01/12/2016  . Lower abdominal pain 02/20/2015  . Abdominal pain, epigastric 12/10/2014  . Spinal stenosis in cervical region 07/01/2014  . Osteopenia 01/22/2014  . Pain in joint, ankle and foot 12/27/2013  . Diverticulitis large intestine w/o perforation or abscess w/o bleeding 07/25/2013  .  TIA (transient ischemic attack) 04/20/2011  . DEPRESSION 03/12/2010  . Urinary tract infection 03/12/2010  . OBESITY 10/08/2009  . CYST AND PSEUDOCYST OF PANCREAS 09/21/2009  . HYPERCHOLESTEROLEMIA 12/10/2008  . ROTATOR CUFF SYNDROME 12/10/2008  . COLONIC POLYPS, HYPERPLASTIC 05/02/2008  . Essential hypertension 09/17/2007  . Palpitations 09/17/2007  . Diabetes mellitus type 2, controlled, without complications ([place]) 85/27/7824  . Hypothyroidism 01/04/2007  . HYPERCALCEMIA 01/04/2007  . Tobacco abuse 01/04/2007  . CARPAL TUNNEL SYNDROME 01/04/2007  . GASTROESOPHAGEAL REFLUX, NO ESOPHAGITIS 01/04/2007  . CERVICAL SPINE DISORDER, NOS 01/04/2007  . BACK PAIN W/RADIATION, UNSPECIFIED 01/04/2007   Past Medical History:  Diagnosis Date  . 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 Cervical spondylosis without myelopathy 09/12/2013  . Complication of anesthesia   . Diabetes mellitus without complication (HCC)    diet controlled -no meds  . Diverticulitis   . Dyslipidemia   . Family history of anesthesia complication    Mother - N/V  . Gastroesophageal reflux disease    8./25/15- no problem  . Glucose intolerance (pre-diabetes)   . Heart murmur    nothing to be concerned  . History of blood transfusion   . History of hiatal hernia   . History of tobacco abuse   . Hypertension   . Hypothyroidism   . Kidney stone   . Obesity   . Oral thrush    after back surgeries  . PONV (postoperative nausea and vomiting)   . Stroke [facility])    TIA X 2-3; last in 2013  . Tachycardia     Family History  Problem Relation Age of Onset  . Heart attack Father   . Cancer Father   . Diabetes Father   . Stroke Mother   . Diabetes Mother   . Cancer Mother   . Heart attack Brother   . Cancer Other   . Coronary artery disease Other   . Stroke Other   . Diabetes Other   . Hypertension Other   . Heart failure Other     Past Surgical History:  Procedure Laterality Date  . ABDOMINAL HYSTERECTOMY     . APPENDECTOMY    . BACK SURGERY     10 spine surgeries since 1988.  Marland Kitchen CARPAL TUNNEL RELEASE Right 04/07/2015   Procedure: RIGHT CARPAL TUNNEL RELEASE;  Surgeon: Daryll Brod, MD;  Location: [place];  Service: Orthopedics;  Laterality: Right;  . CARPAL TUNNEL RELEASE Left 09/29/2015   Procedure: CARPAL TUNNEL RELEASE LEFT ;  Surgeon: Daryll Brod, MD;  Location: [place];  Service: Orthopedics;  Laterality: Left;  . CHOLECYSTECTOMY    . CYSTOSCOPY W/ STONE MANIPULATION    . CYSTOSCOPY W/ URETERAL STENT PLACEMENT    . POSTERIOR CERVICAL FUSION/FORAMINOTOMY N/A 07/01/2014   Procedure: RIGHT C5-6 and C6-7 FORAMINOTOMY;  Surgeon: Jessy Oto, MD;  Location: [place];  Service: Orthopedics;  Laterality: N/A;  . TONSILLECTOMY    .  ULNAR NERVE TRANSPOSITION Right 04/07/2015   Procedure: DECOMPRESSION  ULNAR NERVE RIGHT ELBOW;  Surgeon: Daryll Brod, MD;  Location: [place];  Service: Orthopedics;  Laterality: Right;  . ULNAR NERVE TRANSPOSITION Left 09/29/2015   Procedure: DECOMPRESSION  ULNAR NERVE LEFT ELBOW;  Surgeon: Daryll Brod, MD;  Location: [place];  Service: Orthopedics;  Laterality: Left;   Social History   Occupational History  . Retired Cabin crew    Social History Main Topics  . Smoking status: Current Every Day Smoker    Packs/day: 0.50    Years: 43.00    Types: Cigarettes  . Smokeless tobacco: Never Used     Comment: quit early 02/2016  . Alcohol use No  . Drug use: No  . Sexual activity: Not Currently

## 2017-07-24 ENCOUNTER — Telehealth: Payer: Self-pay | Admitting: *Deleted

## 2017-07-24 NOTE — Telephone Encounter (Signed)
Pt needs [place] to call [facility] [phone] to inform them that she has been off of her Plavix for 2 weeks.  They need this call before they can perform an invasive procedure there. Cristin Penaflor, Salome Spotted, CMA.

## 2017-07-25 NOTE — Telephone Encounter (Signed)
Message given

## 2017-07-26 ENCOUNTER — Encounter (INDEPENDENT_AMBULATORY_CARE_PROVIDER_SITE_OTHER): Payer: Self-pay | Admitting: Specialist

## 2017-07-26 ENCOUNTER — Ambulatory Visit (INDEPENDENT_AMBULATORY_CARE_PROVIDER_SITE_OTHER): Payer: Medicare Other | Admitting: Specialist

## 2017-07-26 DIAGNOSIS — M47812 Spondylosis without myelopathy or radiculopathy, cervical region: Secondary | ICD-10-CM

## 2017-07-26 DIAGNOSIS — M48062 Spinal stenosis, lumbar region with neurogenic claudication: Secondary | ICD-10-CM

## 2017-07-26 DIAGNOSIS — M75111 Incomplete rotator cuff tear or rupture of right shoulder, not specified as traumatic: Secondary | ICD-10-CM

## 2017-07-26 MED ORDER — BUPIVACAINE HCL 0.25 % IJ SOLN
4.0000 mL | INTRAMUSCULAR | Status: AC | PRN
  Administered 2017-07-26: 4 mL via INTRA_ARTICULAR

## 2017-07-26 MED ORDER — IBUPROFEN 800 MG PO TABS: 800.0000 mg | ORAL_TABLET | Freq: Three times a day (TID) | ORAL | 6 refills | Status: DC | PRN

## 2017-07-26 MED ORDER — METHYLPREDNISOLONE ACETATE 40 MG/ML IJ SUSP
40.0000 mg | INTRAMUSCULAR | Status: AC | PRN
  Administered 2017-07-26: 40 mg via INTRA_ARTICULAR

## 2017-07-26 NOTE — Telephone Encounter (Signed)
Letter written and given to RN

## 2017-07-26 NOTE — Telephone Encounter (Signed)
Letter faxed to [place].  Derl Barrow, RN

## 2017-07-26 NOTE — Patient Instructions (Signed)
Avoid overhead lifting and overhead use of the arms. Do not lift greater than 5 lbs. Tylenol ES one every 6-8 hours for pain and inflamation.  Home exercise program.

## 2017-07-26 NOTE — Progress Notes (Signed)
Office Visit Note   Patient: Danielle [name]           Date of Birth: 1956/09/25           MRN: [id number] Visit Date: 07/26/2017              Requested by: [name], MD [address] [place], [place] [address] PCP: [name], MD   Assessment & Plan: Visit Diagnoses:  1. Spondylosis without myelopathy or radiculopathy, cervical region   2. Incomplete tear of right rotator cuff   3. Spinal stenosis of lumbar region with neurogenic claudication     Plan:Avoid overhead lifting and overhead use of the arms. Do not lift greater than 5 lbs. Tylenol ES one every 6-8 hours for pain and inflamation.  Home exercise program.  Follow-Up Instructions: Return in about 4 weeks (around 08/23/2017).   Orders:  Orders Placed This Encounter  Procedures  . Large Joint Injection/Arthrocentesis   Meds ordered this encounter  Medications  . ibuprofen (ADVIL,MOTRIN) 800 MG tablet    Sig: Take 1 tablet (800 mg total) by mouth every 8 (eight) hours as needed.    Dispense:  90 tablet    Refill:  6      Procedures: Large Joint Inj Date/Time: 07/26/2017 11:50 AM Performed by: Jessy Oto Authorized by: Jessy Oto   Consent Given by:  Patient Indications:  Pain Location:  Shoulder Site:  R subacromial bursa Prep: patient was prepped and draped in usual sterile fashion   Needle Size:  25 G Needle Length:  1.5 inches Approach:  Anterolateral Ultrasound Guidance: No   Fluoroscopic Guidance: No   Arthrogram: No   Medications:  40 mg methylPREDNISolone acetate 40 MG/ML; 4 mL bupivacaine 0.25 % Aspiration Attempted: No   Patient tolerance:  Patient tolerated the procedure well with no immediate complications  Bandaid applied.     Clinical Data: No additional findings.   Subjective: No chief complaint on file.   HPI  Review of Systems  HENT: Negative.  Negative for congestion, dental problem, drooling, ear discharge, ear pain, facial swelling,  hearing loss and mouth sores.   Eyes: Negative.  Negative for visual disturbance.  Respiratory: Negative.   Cardiovascular: Negative.   Gastrointestinal: Positive for nausea. Negative for vomiting.  Endocrine: Negative.   Genitourinary: Negative for difficulty urinating, dyspareunia and dysuria.  Musculoskeletal: Positive for back pain and neck stiffness.  Skin: Negative.   Allergic/Immunologic: Negative.   Neurological: Positive for weakness and numbness.  Hematological: Negative.   Psychiatric/Behavioral: Negative.      Objective: Vital Signs: There were no vitals taken for this visit.  Physical Exam  Constitutional: She is oriented to person, place, and time. She appears well-developed and well-nourished.  HENT:  Head: Normocephalic and atraumatic.  Eyes: Pupils are equal, round, and reactive to light. EOM are normal.  Neck: Normal range of motion. Neck supple.  Pulmonary/Chest: Effort normal and breath sounds normal.  Abdominal: Soft. Bowel sounds are normal.  Neurological: She is alert and oriented to person, place, and time.  Skin: Skin is warm and dry.  Psychiatric: She has a normal mood and affect. Her behavior is normal. Judgment and thought content normal.    Right Shoulder Exam   Tenderness  The patient is experiencing tenderness in the acromion and acromioclavicular joint.  Range of Motion  Active Abduction: abnormal  Passive Abduction: abnormal  Extension: normal  Forward Flexion: normal  External Rotation: abnormal  Internal Rotation  0 degrees: abnormal  Internal Rotation 90 degrees: abnormal   Muscle Strength  Abduction: 3/5  Internal Rotation: 5/5  External Rotation: 4/5  Supraspinatus: 3/5  Subscapularis: 5/5  Biceps: 4/5   Tests  Apprehension: positive Impingement: positive  Other  Erythema: absent Scars: absent Sensation: normal Pulse: present      Specialty Comments:  No specialty comments available.  Imaging: No results  found.   PMFS History: Patient Active Problem List   Diagnosis Date Noted  . Vitamin D deficiency 02/09/2017    Priority: High    Class: Chronic  . Thrush 04/19/2017  . PTSD (post-traumatic stress disorder) 03/16/2017  . Chest pain 02/23/2016  . Disorder of bone 01/12/2016  . Lower abdominal pain 02/20/2015  . Abdominal pain, epigastric 12/10/2014  . Spinal stenosis in cervical region 07/01/2014  . Osteopenia 01/22/2014  . Pain in joint, ankle and foot 12/27/2013  . Diverticulitis large intestine w/o perforation or abscess w/o bleeding 07/25/2013  . TIA (transient ischemic attack) 04/20/2011  . DEPRESSION 03/12/2010  . Urinary tract infection 03/12/2010  . OBESITY 10/08/2009  . CYST AND PSEUDOCYST OF PANCREAS 09/21/2009  . HYPERCHOLESTEROLEMIA 12/10/2008  . ROTATOR CUFF SYNDROME 12/10/2008  . COLONIC POLYPS, HYPERPLASTIC 05/02/2008  . Essential hypertension 09/17/2007  . Palpitations 09/17/2007  . Diabetes mellitus type 2, controlled, without complications ([place]) 42/68/3419  . Hypothyroidism 01/04/2007  . HYPERCALCEMIA 01/04/2007  . Tobacco abuse 01/04/2007  . CARPAL TUNNEL SYNDROME 01/04/2007  . GASTROESOPHAGEAL REFLUX, NO ESOPHAGITIS 01/04/2007  . CERVICAL SPINE DISORDER, NOS 01/04/2007  . BACK PAIN W/RADIATION, UNSPECIFIED 01/04/2007   Past Medical History:  Diagnosis Date  . 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 Cervical spondylosis without myelopathy 09/12/2013  . Complication of anesthesia   . Diabetes mellitus without complication (HCC)    diet controlled -no meds  . Diverticulitis   . Dyslipidemia   . Family history of anesthesia complication    Mother - N/V  . Gastroesophageal reflux disease    8./25/15- no problem  . Glucose intolerance (pre-diabetes)   . Heart murmur    nothing to be concerned  . History of blood transfusion   . History of hiatal hernia   . History of tobacco abuse   . Hypertension   . Hypothyroidism   . Kidney stone   . Obesity   .  Oral thrush    after back surgeries  . PONV (postoperative nausea and vomiting)   . Stroke [facility])    TIA X 2-3; last in 2013  . Tachycardia     Family History  Problem Relation Age of Onset  . Heart attack Father   . Cancer Father   . Diabetes Father   . Stroke Mother   . Diabetes Mother   . Cancer Mother   . Heart attack Brother   . Cancer Other   . Coronary artery disease Other   . Stroke Other   . Diabetes Other   . Hypertension Other   . Heart failure Other     Past Surgical History:  Procedure Laterality Date  . ABDOMINAL HYSTERECTOMY    . APPENDECTOMY    . BACK SURGERY     10 spine surgeries since 1988.  Marland Kitchen CARPAL TUNNEL RELEASE Right 04/07/2015   Procedure: RIGHT CARPAL TUNNEL RELEASE;  Surgeon: Daryll Brod, MD;  Location: [place];  Service: Orthopedics;  Laterality: Right;  . CARPAL TUNNEL RELEASE Left 09/29/2015   Procedure: CARPAL TUNNEL RELEASE LEFT ;  Surgeon: Daryll Brod, MD;  Location: [place];  Service: Orthopedics;  Laterality: Left;  . CHOLECYSTECTOMY    . CYSTOSCOPY W/ STONE MANIPULATION    . CYSTOSCOPY W/ URETERAL STENT PLACEMENT    . POSTERIOR CERVICAL FUSION/FORAMINOTOMY N/A 07/01/2014   Procedure: RIGHT C5-6 and C6-7 FORAMINOTOMY;  Surgeon: Jessy Oto, MD;  Location: [place];  Service: Orthopedics;  Laterality: N/A;  . TONSILLECTOMY    . ULNAR NERVE TRANSPOSITION Right 04/07/2015   Procedure: DECOMPRESSION  ULNAR NERVE RIGHT ELBOW;  Surgeon: Daryll Brod, MD;  Location: [place];  Service: Orthopedics;  Laterality: Right;  . ULNAR NERVE TRANSPOSITION Left 09/29/2015   Procedure: DECOMPRESSION  ULNAR NERVE LEFT ELBOW;  Surgeon: Daryll Brod, MD;  Location: [place];  Service: Orthopedics;  Laterality: Left;   Social History   Occupational History  . Retired [employer]    Social History Main Topics  . Smoking status: Current Every Day Smoker    Packs/day: 0.50    Years: 43.00     Types: Cigarettes  . Smokeless tobacco: Never Used     Comment: quit early 02/2016  . Alcohol use No  . Drug use: No  . Sexual activity: Not Currently

## 2017-07-26 NOTE — Telephone Encounter (Signed)
Patient called stating she needed a letter from PCP stating she is off the Plavix.  Please fax letter to [place].  Derl Barrow, RN

## 2017-08-07 ENCOUNTER — Telehealth: Payer: Self-pay

## 2017-08-07 NOTE — Telephone Encounter (Signed)
Despite patient's insistence she has had iodinated contrast without a prep since her allergic reaction during and IVP, she has agreed to take a 13-hour prep for her MR shoulder arthrogram tomorrow.  This was called in to her [facility] in [place]: Prednisone 50mg  PO 08/08/17 @ 0230, 0830 and 1430.  Benadryl 50mg  PO 08/08/17 @ 1430.  Pt is aware of these instructions.  Gypsy Lore, RN

## 2017-08-08 ENCOUNTER — Other Ambulatory Visit: Payer: Medicare Other

## 2017-08-08 ENCOUNTER — Ambulatory Visit
Admission: RE | Admit: 2017-08-08 | Discharge: 2017-08-08 | Disposition: A | Discharge status: Home / Self Care | Payer: Medicare Other | Source: Ambulatory Visit | Attending: Orthopedic Surgery | Admitting: Orthopedic Surgery

## 2017-08-08 DIAGNOSIS — G8929 Other chronic pain: Secondary | ICD-10-CM

## 2017-08-08 DIAGNOSIS — M25511 Pain in right shoulder: Principal | ICD-10-CM

## 2017-08-08 MED ORDER — IOPAMIDOL (ISOVUE-M 200) INJECTION 41%
15.0000 mL | Freq: Once | INTRAMUSCULAR | Status: AC
  Administered 2017-08-08: 15 mL via INTRA_ARTICULAR

## 2017-08-17 ENCOUNTER — Encounter (INDEPENDENT_AMBULATORY_CARE_PROVIDER_SITE_OTHER): Payer: Self-pay | Admitting: Orthopedic Surgery

## 2017-08-17 ENCOUNTER — Ambulatory Visit (INDEPENDENT_AMBULATORY_CARE_PROVIDER_SITE_OTHER): Payer: Medicare Other | Admitting: Orthopedic Surgery

## 2017-08-17 DIAGNOSIS — M25511 Pain in right shoulder: Secondary | ICD-10-CM

## 2017-08-17 NOTE — Progress Notes (Signed)
Office Visit Note   Patient: Danielle [name]           Date of Birth: 03-Jan-1956           MRN: [id number] Visit Date: 08/17/2017 Requested by: [name], MD [place], [place] [id number] PCP: [name], MD  Subjective: Chief Complaint  Patient presents with  . Right Shoulder - Follow-up    HPI: Phylicia is a 61 year old patient with right shoulder pain.  Since I have seen her she has had an MRI scan which is reviewed.  She has mild acromioclavicular joint arthritis no full-thickness rotator cuff tear she does have a superior labral tear with early spinoglenoid notch cyst.  She did have an injection with Dr. Merrilee Seashore, 2 weeks ago which helped.  She had prior injection from me.  She states she can currently live with her current amount of symptoms              ROS: All systems reviewed are negative as they relate to the chief complaint within the history of present illness.  Patient denies  fevers or chills.   Assessment & Plan: Visit Diagnoses:  1. Right shoulder pain, unspecified chronicity     Plan: Impression is right shoulder pain with superior labral tear spinoglenoid notch cyst and mildly symptomatic acromial clavicular joint arthritis.  Landed moving forward would be surgical intervention with subacromial decompression and bursectomy although she has fairly minimal evidence for bony impingement.  Arthroscopic distal clavicle excision and biceps tenodesis would also be indicated.  If she gets to the point where she needs to consider that intervention she'll call and we will set her up for that.  For now she can live with her symptoms.  All up with me as needed  Follow-Up Instructions: Return if symptoms worsen or fail to improve.   Orders:  No orders of the defined types were placed in this encounter.  No orders of the defined types were placed in this encounter.     Procedures: No procedures performed   Clinical Data: No additional  findings.  Objective: Vital Signs: There were no vitals taken for this visit.  Physical Exam:   Constitutional: Patient appears well-developed HEENT:  Head: Normocephalic Eyes:EOM are normal Neck: Normal range of motion Cardiovascular: Normal rate Pulmonary/chest: Effort normal Neurologic: Patient is alert Skin: Skin is warm Psychiatric: Patient has normal mood and affect    Ortho Exam: Orthopedic exam demonstrates full active and passive range of motion of the right shoulder with mildly tender acromioclavicular joint right versus left.  A little bit of popping is noted in this joint.  O'Brien's testing equivalent on the right negative on the left.  Rotator cuff strength is good to infraspinatus supraspinatus and subscap muscle testing.  No other masses lymph adenopathy or skin changes noted in the shoulder girdle region.  Negative apprehension relocation testing mild pain with crossarm adduction on the right versus left  Specialty Comments:  No specialty comments available.  Imaging: No results found.   PMFS History: Patient Active Problem List   Diagnosis Date Noted  . Thrush 04/19/2017  . PTSD (post-traumatic stress disorder) 03/16/2017  . Vitamin D deficiency 02/09/2017    Class: Chronic  . Chest pain 02/23/2016  . Disorder of bone 01/12/2016  . Lower abdominal pain 02/20/2015  . Abdominal pain, epigastric 12/10/2014  . Spinal stenosis in cervical region 07/01/2014  . Osteopenia 01/22/2014  . Pain in joint, ankle and foot 12/27/2013  .  Diverticulitis large intestine w/o perforation or abscess w/o bleeding 07/25/2013  . TIA (transient ischemic attack) 04/20/2011  . DEPRESSION 03/12/2010  . Urinary tract infection 03/12/2010  . OBESITY 10/08/2009  . CYST AND PSEUDOCYST OF PANCREAS 09/21/2009  . HYPERCHOLESTEROLEMIA 12/10/2008  . ROTATOR CUFF SYNDROME 12/10/2008  . COLONIC POLYPS, HYPERPLASTIC 05/02/2008  . Essential hypertension 09/17/2007  . Palpitations  09/17/2007  . Diabetes mellitus type 2, controlled, without complications ([place]) 53/29/9242  . Hypothyroidism 01/04/2007  . HYPERCALCEMIA 01/04/2007  . Tobacco abuse 01/04/2007  . CARPAL TUNNEL SYNDROME 01/04/2007  . GASTROESOPHAGEAL REFLUX, NO ESOPHAGITIS 01/04/2007  . CERVICAL SPINE DISORDER, NOS 01/04/2007  . BACK PAIN W/RADIATION, UNSPECIFIED 01/04/2007   Past Medical History:  Diagnosis Date  . 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 Cervical spondylosis without myelopathy 09/12/2013  . Complication of anesthesia   . Diabetes mellitus without complication (HCC)    diet controlled -no meds  . Diverticulitis   . Dyslipidemia   . Family history of anesthesia complication    Mother - N/V  . Gastroesophageal reflux disease    8./25/15- no problem  . Glucose intolerance (pre-diabetes)   . Heart murmur    nothing to be concerned  . History of blood transfusion   . History of hiatal hernia   . History of tobacco abuse   . Hypertension   . Hypothyroidism   . Kidney stone   . Obesity   . Oral thrush    after back surgeries  . PONV (postoperative nausea and vomiting)   . Stroke [facility])    TIA X 2-3; last in 2013  . Tachycardia     Family History  Problem Relation Age of Onset  . Heart attack Father   . Cancer Father   . Diabetes Father   . Stroke Mother   . Diabetes Mother   . Cancer Mother   . Heart attack Brother   . Cancer Other   . Coronary artery disease Other   . Stroke Other   . Diabetes Other   . Hypertension Other   . Heart failure Other     Past Surgical History:  Procedure Laterality Date  . ABDOMINAL HYSTERECTOMY    . APPENDECTOMY    . BACK SURGERY     10 spine surgeries since 1988.  Marland Kitchen CARPAL TUNNEL RELEASE Right 04/07/2015   Procedure: RIGHT CARPAL TUNNEL RELEASE;  Surgeon: Daryll Brod, MD;  Location: [place];  Service: Orthopedics;  Laterality: Right;  . CARPAL TUNNEL RELEASE Left 09/29/2015   Procedure: CARPAL TUNNEL RELEASE LEFT ;   Surgeon: Daryll Brod, MD;  Location: [place];  Service: Orthopedics;  Laterality: Left;  . CHOLECYSTECTOMY    . CYSTOSCOPY W/ STONE MANIPULATION    . CYSTOSCOPY W/ URETERAL STENT PLACEMENT    . POSTERIOR CERVICAL FUSION/FORAMINOTOMY N/A 07/01/2014   Procedure: RIGHT C5-6 and C6-7 FORAMINOTOMY;  Surgeon: Jessy Oto, MD;  Location: [place];  Service: Orthopedics;  Laterality: N/A;  . TONSILLECTOMY    . ULNAR NERVE TRANSPOSITION Right 04/07/2015   Procedure: DECOMPRESSION  ULNAR NERVE RIGHT ELBOW;  Surgeon: Daryll Brod, MD;  Location: [place];  Service: Orthopedics;  Laterality: Right;  . ULNAR NERVE TRANSPOSITION Left 09/29/2015   Procedure: DECOMPRESSION  ULNAR NERVE LEFT ELBOW;  Surgeon: Daryll Brod, MD;  Location: [place];  Service: Orthopedics;  Laterality: Left;   Social History   Occupational History  . Retired [employer]  Social History Main Topics  . Smoking status: Current Every Day Smoker    Packs/day: 0.50    Years: 43.00    Types: Cigarettes  . Smokeless tobacco: Never Used     Comment: quit early 02/2016  . Alcohol use No  . Drug use: No  . Sexual activity: Not Currently

## 2017-09-01 ENCOUNTER — Encounter (INDEPENDENT_AMBULATORY_CARE_PROVIDER_SITE_OTHER): Payer: Self-pay | Admitting: Specialist

## 2017-09-01 ENCOUNTER — Ambulatory Visit (INDEPENDENT_AMBULATORY_CARE_PROVIDER_SITE_OTHER): Payer: Medicare Other | Admitting: Specialist

## 2017-09-01 VITALS — BP 168/89 | HR 89 | Ht 65.0 in | Wt 173.0 lb

## 2017-09-01 DIAGNOSIS — M7541 Impingement syndrome of right shoulder: Secondary | ICD-10-CM

## 2017-09-01 DIAGNOSIS — M5134 Other intervertebral disc degeneration, thoracic region: Secondary | ICD-10-CM

## 2017-09-01 DIAGNOSIS — M7061 Trochanteric bursitis, right hip: Secondary | ICD-10-CM

## 2017-09-01 DIAGNOSIS — M5136 Other intervertebral disc degeneration, lumbar region: Secondary | ICD-10-CM | POA: Diagnosis not present

## 2017-09-01 DIAGNOSIS — M19019 Primary osteoarthritis, unspecified shoulder: Secondary | ICD-10-CM

## 2017-09-01 DIAGNOSIS — M47815 Spondylosis without myelopathy or radiculopathy, thoracolumbar region: Secondary | ICD-10-CM

## 2017-09-01 MED ORDER — BUPIVACAINE HCL 0.25 % IJ SOLN
4.0000 mL | INTRAMUSCULAR | Status: AC | PRN
  Administered 2017-09-01: 4 mL via INTRA_ARTICULAR

## 2017-09-01 MED ORDER — METHYLPREDNISOLONE ACETATE 40 MG/ML IJ SUSP
40.0000 mg | INTRAMUSCULAR | Status: AC | PRN
  Administered 2017-09-01: 40 mg via INTRA_ARTICULAR

## 2017-09-01 NOTE — Patient Instructions (Addendum)
Avoid frequent bending and stooping  No lifting greater than 10 lbs. May use ice or moist heat for pain. Weight loss is of benefit. Handicap license is approved. Perform extension exercises of the thoracolumbar spine. Buy a champion work belt to decrease the stress across the thoracolumbar area but only wear for 12 days at the most.  Take vitamin D and calcium supplements Walking exercise to stress bone and joints, in a swimming pool if necessary. Ibuprofen for arthritis pain.

## 2017-09-01 NOTE — Progress Notes (Signed)
Office Visit Note   Patient: Danielle [name]           Date of Birth: 06-Feb-1956           MRN: [id number] Visit Date: 09/01/2017              Requested by: [name], MD [place], [place] [address] PCP: [name], MD   Assessment & Plan: Visit Diagnoses:  1. Impingement syndrome of right shoulder   2. Osteoarthritis of acromioclavicular joint   3. Spondylosis without myelopathy or radiculopathy, thoracolumbar region   4. Degenerative disc disease, lumbar   5. DDD (degenerative disc disease), thoracic   6. Greater trochanteric bursitis of right hip     Plan:Avoid frequent bending and stooping  No lifting greater than 10 lbs. May use ice or moist heat for pain. Weight loss is of benefit. Handicap license is approved. Perform extension exercises of the thoracolumbar spine. Buy a champion work belt to decrease the stress across the thoracolumbar area but only wear for 12 days at the most.  Take vitamin D and calcium supplements Walking exercise to stress bone and joints, in a swimming pool if necessary. Ibuprofen for arthritis pain.   Follow-Up Instructions: Return in about 6 weeks (around 10/13/2017).   Orders:  Orders Placed This Encounter  Procedures  . Large Joint Injection/Arthrocentesis   No orders of the defined types were placed in this encounter.     Procedures: Large Joint Inj Date/Time: 09/01/2017 9:50 AM Performed by: Jessy Oto Authorized by: Jessy Oto   Consent Given by:  Patient Indications:  Pain Location:  Hip Site:  R greater trochanter Prep: patient was prepped and draped in usual sterile fashion   Needle Size:  22 G Needle Length:  3.5 inches Approach:  Superolateral Ultrasound Guidance: No   Fluoroscopic Guidance: No   Arthrogram: No   Medications:  40 mg methylPREDNISolone acetate 40 MG/ML; 4 mL bupivacaine 0.25 % Aspiration Attempted: No   Patient tolerance:  Patient tolerated the procedure  well with no immediate complications  bandaid applied     Clinical Data: No additional findings.   Subjective: Chief Complaint  Patient presents with  . Neck - Follow-up  . Lower Back - Follow-up    61 year old female seen today follow up of the right shoulder. She was experiencing severe pain but underwent injection of the right shoulder subacromial space via posterolateral approach. An MRI was done and showed bursitis, a small rotator cuff tear and biceps tendonitis.  No neck pain , does have some right lateral hip pain. With pain with lying on th right hip and with bending she hurts in the right hip. Als o with bilateral inferior costal pain that radiates to both sides along the lower costochrondral and costovertebral areas radiating posterior to anterior bilaterally. First up in the AM and with bending over to do the bed and rolling over at night with pain in the mid thoracolumbar junction.    Review of Systems  Constitutional: Negative.   HENT: Negative.   Eyes: Negative.   Respiratory: Negative.   Cardiovascular: Negative.   Gastrointestinal: Negative.   Endocrine: Negative.   Genitourinary: Negative.   Musculoskeletal: Negative.   Skin: Negative.   Allergic/Immunologic: Negative.   Neurological: Negative.   Hematological: Negative.   Psychiatric/Behavioral: Negative.      Objective: Vital Signs: BP (!) 168/89 (BP Location: Left Arm, Patient Position: Sitting)   Pulse 89  Ht 5\' 5"  (1.651 m)   Wt 173 lb (78.5 kg)   BMI 28.79 kg/m   Physical Exam  Constitutional: She is oriented to person, place, and time. She appears well-developed and well-nourished.  HENT:  Head: Normocephalic and atraumatic.  Eyes: Pupils are equal, round, and reactive to light. EOM are normal.  Neck: Normal range of motion. Neck supple.  Pulmonary/Chest: Effort normal and breath sounds normal.  Abdominal: Soft. Bowel sounds are normal.  Neurological: She is alert and oriented to  person, place, and time.  Skin: Skin is warm and dry.  Psychiatric: She has a normal mood and affect. Her behavior is normal. Judgment and thought content normal.    Right Hip Exam   Tenderness  The patient is experiencing tenderness in the greater trochanter.  Range of Motion  Extension: normal  Flexion: normal  Internal Rotation: normal  External Rotation: normal  Abduction: abnormal  Adduction: abnormal   Muscle Strength  Abduction: 5/5  Adduction: 5/5  Flexion: 5/5   Tests  FABER: positive Ober: positive  Other  Erythema: absent Scars: absent  Comments:  Tender righ tgreater trochanter.   Back Exam   Tenderness  The patient is experiencing tenderness in the lumbar.  Range of Motion  Extension: abnormal  Flexion: normal  Lateral Bend Right: abnormal  Lateral Bend Left: abnormal  Rotation Right: abnormal  Rotation Left: abnormal   Muscle Strength  Right Quadriceps:  5/5  Left Quadriceps:  5/5  Right Hamstrings:  5/5  Left Hamstrings:  5/5   Tests  Straight leg raise right: positive Straight leg raise left: negative  Reflexes  Patellar:  2/4 normal Achilles:  1/4 normal Babinski's sign: normal   Other  Toe Walk: normal Heel Walk: normal Sensation: normal Gait: normal  Erythema: no back redness Scars: absent  Comments:  No clonus, no spasticity,       Specialty Comments:  No specialty comments available.  Imaging: No results found.   PMFS History: Patient Active Problem List   Diagnosis Date Noted  . Vitamin D deficiency 02/09/2017    Priority: High    Class: Chronic  . Thrush 04/19/2017  . PTSD (post-traumatic stress disorder) 03/16/2017  . Chest pain 02/23/2016  . Disorder of bone 01/12/2016  . Lower abdominal pain 02/20/2015  . Abdominal pain, epigastric 12/10/2014  . Spinal stenosis in cervical region 07/01/2014  . Osteopenia 01/22/2014  . Pain in joint, ankle and foot 12/27/2013  . Diverticulitis large intestine  w/o perforation or abscess w/o bleeding 07/25/2013  . TIA (transient ischemic attack) 04/20/2011  . DEPRESSION 03/12/2010  . Urinary tract infection 03/12/2010  . OBESITY 10/08/2009  . CYST AND PSEUDOCYST OF PANCREAS 09/21/2009  . HYPERCHOLESTEROLEMIA 12/10/2008  . ROTATOR CUFF SYNDROME 12/10/2008  . COLONIC POLYPS, HYPERPLASTIC 05/02/2008  . Essential hypertension 09/17/2007  . Palpitations 09/17/2007  . Diabetes mellitus type 2, controlled, without complications ([place]) 09/98/3382  . Hypothyroidism 01/04/2007  . HYPERCALCEMIA 01/04/2007  . Tobacco abuse 01/04/2007  . CARPAL TUNNEL SYNDROME 01/04/2007  . GASTROESOPHAGEAL REFLUX, NO ESOPHAGITIS 01/04/2007  . CERVICAL SPINE DISORDER, NOS 01/04/2007  . BACK PAIN W/RADIATION, UNSPECIFIED 01/04/2007   Past Medical History:  Diagnosis Date  . 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 Cervical spondylosis without myelopathy 09/12/2013  . Complication of anesthesia   . Diabetes mellitus without complication (HCC)    diet controlled -no meds  . Diverticulitis   . Dyslipidemia   . Family history of anesthesia complication  Mother - N/V  . Gastroesophageal reflux disease    8./25/15- no problem  . Glucose intolerance (pre-diabetes)   . Heart murmur    nothing to be concerned  . History of blood transfusion   . History of hiatal hernia   . History of tobacco abuse   . Hypertension   . Hypothyroidism   . Kidney stone   . Obesity   . Oral thrush    after back surgeries  . PONV (postoperative nausea and vomiting)   . Stroke [facility])    TIA X 2-3; last in 2013  . Tachycardia     Family History  Problem Relation Age of Onset  . Heart attack Father   . Cancer Father   . Diabetes Father   . Stroke Mother   . Diabetes Mother   . Cancer Mother   . Heart attack Brother   . Cancer Other   . Coronary artery disease Other   . Stroke Other   . Diabetes Other   . Hypertension Other   . Heart failure Other     Past Surgical History:    Procedure Laterality Date  . ABDOMINAL HYSTERECTOMY    . APPENDECTOMY    . BACK SURGERY     10 spine surgeries since 1988.  Marland Kitchen CARPAL TUNNEL RELEASE Right 04/07/2015   Procedure: RIGHT CARPAL TUNNEL RELEASE;  Surgeon: Daryll Brod, MD;  Location: [place];  Service: Orthopedics;  Laterality: Right;  . CARPAL TUNNEL RELEASE Left 09/29/2015   Procedure: CARPAL TUNNEL RELEASE LEFT ;  Surgeon: Daryll Brod, MD;  Location: [place];  Service: Orthopedics;  Laterality: Left;  . CHOLECYSTECTOMY    . CYSTOSCOPY W/ STONE MANIPULATION    . CYSTOSCOPY W/ URETERAL STENT PLACEMENT    . POSTERIOR CERVICAL FUSION/FORAMINOTOMY N/A 07/01/2014   Procedure: RIGHT C5-6 and C6-7 FORAMINOTOMY;  Surgeon: Jessy Oto, MD;  Location: [place];  Service: Orthopedics;  Laterality: N/A;  . TONSILLECTOMY    . ULNAR NERVE TRANSPOSITION Right 04/07/2015   Procedure: DECOMPRESSION  ULNAR NERVE RIGHT ELBOW;  Surgeon: Daryll Brod, MD;  Location: [place];  Service: Orthopedics;  Laterality: Right;  . ULNAR NERVE TRANSPOSITION Left 09/29/2015   Procedure: DECOMPRESSION  ULNAR NERVE LEFT ELBOW;  Surgeon: Daryll Brod, MD;  Location: [place];  Service: Orthopedics;  Laterality: Left;   Social History   Occupational History  . Retired Cabin crew    Social History Main Topics  . Smoking status: Current Every Day Smoker    Packs/day: 0.50    Years: 43.00    Types: Cigarettes  . Smokeless tobacco: Never Used     Comment: quit early 02/2016  . Alcohol use No  . Drug use: No  . Sexual activity: Not Currently

## 2017-09-13 ENCOUNTER — Other Ambulatory Visit: Payer: Self-pay | Admitting: Family Medicine

## 2017-09-21 ENCOUNTER — Other Ambulatory Visit: Payer: Self-pay | Admitting: Family Medicine

## 2017-09-22 NOTE — Telephone Encounter (Signed)
Unclear to me how long she needs dual antiplatelet therapy.  Will refill x 1 only and ask to make an appointment to discuss.

## 2017-10-13 ENCOUNTER — Encounter (INDEPENDENT_AMBULATORY_CARE_PROVIDER_SITE_OTHER): Payer: Self-pay | Admitting: Specialist

## 2017-10-13 ENCOUNTER — Ambulatory Visit (INDEPENDENT_AMBULATORY_CARE_PROVIDER_SITE_OTHER): Payer: Medicare Other | Admitting: Specialist

## 2017-10-13 ENCOUNTER — Ambulatory Visit (INDEPENDENT_AMBULATORY_CARE_PROVIDER_SITE_OTHER): Payer: Medicare Other

## 2017-10-13 ENCOUNTER — Telehealth (INDEPENDENT_AMBULATORY_CARE_PROVIDER_SITE_OTHER): Payer: Self-pay

## 2017-10-13 VITALS — BP 156/102 | HR 106 | Ht 65.0 in | Wt 173.0 lb

## 2017-10-13 DIAGNOSIS — M19011 Primary osteoarthritis, right shoulder: Secondary | ICD-10-CM

## 2017-10-13 DIAGNOSIS — M7061 Trochanteric bursitis, right hip: Secondary | ICD-10-CM

## 2017-10-13 DIAGNOSIS — M25511 Pain in right shoulder: Secondary | ICD-10-CM | POA: Diagnosis not present

## 2017-10-13 DIAGNOSIS — S63601A Unspecified sprain of right thumb, initial encounter: Secondary | ICD-10-CM

## 2017-10-13 DIAGNOSIS — M79641 Pain in right hand: Secondary | ICD-10-CM

## 2017-10-13 MED ORDER — BUPIVACAINE HCL 0.5 % IJ SOLN
1.0000 mL | INTRAMUSCULAR | Status: AC | PRN
  Administered 2017-10-13: 1 mL via INTRA_ARTICULAR

## 2017-10-13 MED ORDER — LIDOCAINE HCL 1 % IJ SOLN
1.0000 mL | INTRAMUSCULAR | Status: AC | PRN
  Administered 2017-10-13: 1 mL

## 2017-10-13 MED ORDER — METHYLPREDNISOLONE ACETATE 40 MG/ML IJ SUSP
40.0000 mg | INTRAMUSCULAR | Status: AC | PRN
  Administered 2017-10-13: 40 mg via INTRA_ARTICULAR

## 2017-10-13 MED ORDER — METHYLPREDNISOLONE ACETATE 40 MG/ML IJ SUSP
40.0000 mg | INTRAMUSCULAR | Status: AC | PRN
  Administered 2017-10-13: 40 mg via INTRAMUSCULAR

## 2017-10-13 MED ORDER — BUPIVACAINE HCL 0.25 % IJ SOLN
3.0000 mL | INTRAMUSCULAR | Status: AC | PRN
  Administered 2017-10-13: 3 mL

## 2017-10-13 MED ORDER — BUPIVACAINE HCL 0.25 % IJ SOLN
4.0000 mL | INTRAMUSCULAR | Status: AC | PRN
  Administered 2017-10-13: 4 mL via INTRA_ARTICULAR

## 2017-10-13 NOTE — Telephone Encounter (Signed)
Patient was seen for an appointment today with Dr Louanne Skye to follow up on her back. She stated that she was ready to proceed with surgery on her shoulder. Please advise and complete blue sheet. Thanks.

## 2017-10-13 NOTE — Patient Instructions (Signed)
Avoid overhead lifting and overhead use of the arms. Do not lift greater than 10 lbs. Tylenol ES one every 6-8 hours for pain and inflamation.  Continue with a home exercise program for the shoulder.  Dr. Marlou Sa has recommended that you call if the right shoulder pain recurrs to call him and he would fix the problem with the right shoulder, injections may only be temporizing the right shoulder condition. The right hand middle finger splint should be used for one week and then as needed. Hip exercises help to stretch the tissues about the right hip and prevent recurrence of the right right hip bursitis.

## 2017-10-13 NOTE — Progress Notes (Signed)
Office Visit Note   Patient: Danielle [name]           Date of Birth: 04-10-1956           MRN: [id number] Visit Date: 10/13/2017              Requested by: [name], MD [place], [place] [address] PCP: [name], MD   Assessment & Plan: Visit Diagnoses:  1. Pain in right hand   2. Greater trochanteric bursitis of right hip   3. Arthritis of right acromioclavicular joint   4. Trigger point of shoulder region, right   5. Sprain of right thumb, unspecified site of finger, initial encounter     Plan: Avoid overhead lifting and overhead use of the arms. Do not lift greater than 10 lbs. Tylenol ES one every 6-8 hours for pain and inflamation.  Continue with a home exercise program for the shoulder.  Dr. Marlou Sa has recommended that you call if the right shoulder pain recurrs to call him and he would fix the problem with the right shoulder, injections may only be temporizing the right shoulder condition. The right hand middle finger splint should be used for one week and then as needed. Hip exercises help to stretch the tissues about the right hip and prevent recurrence of the right right hip bursitis  Follow-Up Instructions: Return in about 3 years (around 10/13/2020).   Orders:  Orders Placed This Encounter  Procedures  . Large Joint Inj  . Large Joint Inj  . Small Joint Inj  . Medium Joint Inj  . Trigger Point Inj  . XR Hand Complete Right   No orders of the defined types were placed in this encounter.     Procedures: Large Joint Inj: R greater trochanter on 10/13/2017 9:55 AM Indications: pain Details: 22 G 3.5 in needle, lateral approach  Arthrogram: No  Medications: 40 mg methylPREDNISolone acetate 40 MG/ML; 4 mL bupivacaine 0.25 % Outcome: tolerated well, no immediate complications  Band aid applied Procedure, treatment alternatives, risks and benefits explained, specific risks discussed. Immediately prior to procedure a time out  was called to verify the correct patient, procedure, equipment, support staff and site/side marked as required. Patient was prepped and draped in the usual sterile fashion.   Medium Joint Inj: R acromioclavicular on 10/13/2017 9:56 AM Medications: 1 mL lidocaine 1 %; 1 mL bupivacaine 0.5 %; 40 mg methylPREDNISolone acetate 40 MG/ML  bandaid applied Procedure, treatment alternatives, risks and benefits explained, specific risks discussed. Immediately prior to procedure a time out was called to verify the correct patient, procedure, equipment, support staff and site/side marked as required. Patient was prepped and draped in the usual sterile fashion.   Trigger Point Inj Date/Time: 10/13/2017 9:57 AM Performed by: Jessy Oto, MD Authorized by: Jessy Oto, MD   Indications:  Muscle spasm and pain Total # of Trigger Points:  1 Location: shoulder   Needle Size:  25 G Medications #1:  1 mL lidocaine 1 %; 3 mL bupivacaine 0.25 %; 40 mg methylPREDNISolone acetate 40 MG/ML Comments: Bandaid applied right upper scapula border.     Clinical Data: No additional findings.   Subjective: Chief Complaint  Patient presents with  . Right Hand - Pain  . Lower Back - Follow-up    61 year old female right hand dominant, she awoke with swelling in the right dorsal hand over the radial and mid long finger MCP, there is pain with  bending the right finger. The right shoulder is also painful and she is having difficulty raising her right shoulder and arm overhead, pain with lying on the right hip. The right shoulder held on a pillow does okay. The right shoulder is weak and she is having difficulty lifting with the right arm.    Review of Systems  Constitutional: Positive for activity change. Negative for unexpected weight change.  HENT: Positive for sneezing. Negative for congestion, dental problem, drooling, ear discharge, ear pain, facial swelling, hearing loss, mouth sores, nosebleeds,  postnasal drip, rhinorrhea, sinus pressure, sinus pain, sore throat, tinnitus, trouble swallowing and voice change.   Eyes: Positive for visual disturbance. Negative for photophobia, pain, discharge, redness and itching.  Respiratory: Negative for apnea, cough, choking, chest tightness, shortness of breath, wheezing and stridor.   Cardiovascular: Negative.  Negative for chest pain, palpitations and leg swelling.  Gastrointestinal: Negative.  Negative for abdominal distention, abdominal pain, anal bleeding, blood in stool, constipation, diarrhea, nausea, rectal pain and vomiting.  Endocrine: Negative.  Negative for cold intolerance, heat intolerance, polydipsia, polyphagia and polyuria.  Genitourinary: Negative.  Negative for difficulty urinating, dyspareunia, dysuria, enuresis, flank pain, frequency, hematuria and urgency.  Musculoskeletal: Positive for back pain. Negative for arthralgias, gait problem, myalgias, neck pain and neck stiffness.  Skin: Negative.  Negative for color change, pallor, rash and wound.  Allergic/Immunologic: Positive for environmental allergies. Negative for food allergies.  Neurological: Negative.  Negative for dizziness, tremors, seizures, syncope, facial asymmetry, speech difficulty, weakness, light-headedness, numbness and headaches.  Hematological: Negative.  Negative for adenopathy. Does not bruise/bleed easily.  Psychiatric/Behavioral: Negative.  Negative for agitation, behavioral problems, confusion, decreased concentration, dysphoric mood, hallucinations, self-injury, sleep disturbance and suicidal ideas. The patient is not nervous/anxious and is not hyperactive.      Objective: Vital Signs: BP (!) 156/102   Pulse (!) 106   Ht 5\' 5"  (1.651 m)   Wt 173 lb (78.5 kg)   BMI 28.79 kg/m   Physical Exam  Constitutional: She is oriented to person, place, and time. She appears well-developed and well-nourished.  HENT:  Head: Normocephalic and atraumatic.  Eyes:  EOM are normal. Pupils are equal, round, and reactive to light.  Neck: Normal range of motion. Neck supple.  Pulmonary/Chest: Effort normal and breath sounds normal.  Abdominal: Soft. Bowel sounds are normal.  Musculoskeletal: Normal range of motion.  Neurological: She is alert and oriented to person, place, and time.  Skin: Skin is warm and dry.  Psychiatric: She has a normal mood and affect. Her behavior is normal. Judgment and thought content normal.    Ortho Exam  Specialty Comments:  No specialty comments available.  Imaging: Xr Hand Complete Right  Result Date: 10/13/2017 AP lateral and oblique radiographs of the right hand with ST swelling about the right Long finger MCP joint no bone changes no fracture, dislocation or subluxation.     PMFS History: Patient Active Problem List   Diagnosis Date Noted  . Vitamin D deficiency 02/09/2017    Priority: High    Class: Chronic  . Thrush 04/19/2017  . PTSD (post-traumatic stress disorder) 03/16/2017  . Chest pain 02/23/2016  . Disorder of bone 01/12/2016  . Lower abdominal pain 02/20/2015  . Abdominal pain, epigastric 12/10/2014  . Spinal stenosis in cervical region 07/01/2014  . Osteopenia 01/22/2014  . Pain in joint, ankle and foot 12/27/2013  . Diverticulitis large intestine w/o perforation or abscess w/o bleeding 07/25/2013  . TIA (transient ischemic attack) 04/20/2011  .  DEPRESSION 03/12/2010  . Urinary tract infection 03/12/2010  . OBESITY 10/08/2009  . CYST AND PSEUDOCYST OF PANCREAS 09/21/2009  . HYPERCHOLESTEROLEMIA 12/10/2008  . ROTATOR CUFF SYNDROME 12/10/2008  . COLONIC POLYPS, HYPERPLASTIC 05/02/2008  . Essential hypertension 09/17/2007  . Palpitations 09/17/2007  . Diabetes mellitus type 2, controlled, without complications ([place]) 74/10/8785  . Hypothyroidism 01/04/2007  . HYPERCALCEMIA 01/04/2007  . Tobacco abuse 01/04/2007  . CARPAL TUNNEL SYNDROME 01/04/2007  . GASTROESOPHAGEAL REFLUX, NO  ESOPHAGITIS 01/04/2007  . CERVICAL SPINE DISORDER, NOS 01/04/2007  . BACK PAIN W/RADIATION, UNSPECIFIED 01/04/2007   Past Medical History:  Diagnosis Date  . 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 Cervical spondylosis without myelopathy 09/12/2013  . Complication of anesthesia   . Diabetes mellitus without complication (HCC)    diet controlled -no meds  . Diverticulitis   . Dyslipidemia   . Family history of anesthesia complication    Mother - N/V  . Gastroesophageal reflux disease    8./25/15- no problem  . Glucose intolerance (pre-diabetes)   . Heart murmur    nothing to be concerned  . History of blood transfusion   . History of hiatal hernia   . History of tobacco abuse   . Hypertension   . Hypothyroidism   . Kidney stone   . Obesity   . Oral thrush    after back surgeries  . PONV (postoperative nausea and vomiting)   . Stroke [facility])    TIA X 2-3; last in 2013  . Tachycardia     Family History  Problem Relation Age of Onset  . Heart attack Father   . Cancer Father   . Diabetes Father   . Stroke Mother   . Diabetes Mother   . Cancer Mother   . Heart attack Brother   . Cancer Other   . Coronary artery disease Other   . Stroke Other   . Diabetes Other   . Hypertension Other   . Heart failure Other     Past Surgical History:  Procedure Laterality Date  . ABDOMINAL HYSTERECTOMY    . APPENDECTOMY    . BACK SURGERY     10 spine surgeries since 1988.  Marland Kitchen CARPAL TUNNEL RELEASE Right 04/07/2015   Procedure: RIGHT CARPAL TUNNEL RELEASE;  Surgeon: Daryll Brod, MD;  Location: [place];  Service: Orthopedics;  Laterality: Right;  . CARPAL TUNNEL RELEASE Left 09/29/2015   Procedure: CARPAL TUNNEL RELEASE LEFT ;  Surgeon: Daryll Brod, MD;  Location: [place];  Service: Orthopedics;  Laterality: Left;  . CHOLECYSTECTOMY    . CYSTOSCOPY W/ STONE MANIPULATION    . CYSTOSCOPY W/ URETERAL STENT PLACEMENT    . POSTERIOR CERVICAL  FUSION/FORAMINOTOMY N/A 07/01/2014   Procedure: RIGHT C5-6 and C6-7 FORAMINOTOMY;  Surgeon: Jessy Oto, MD;  Location: [place];  Service: Orthopedics;  Laterality: N/A;  . TONSILLECTOMY    . ULNAR NERVE TRANSPOSITION Right 04/07/2015   Procedure: DECOMPRESSION  ULNAR NERVE RIGHT ELBOW;  Surgeon: Daryll Brod, MD;  Location: [place];  Service: Orthopedics;  Laterality: Right;  . ULNAR NERVE TRANSPOSITION Left 09/29/2015   Procedure: DECOMPRESSION  ULNAR NERVE LEFT ELBOW;  Surgeon: Daryll Brod, MD;  Location: [place];  Service: Orthopedics;  Laterality: Left;   Social History   Occupational History  . Occupation: Retired Cabin crew  Tobacco Use  . Smoking status: Current Every Day Smoker    Packs/day: 0.50  Years: 43.00    Pack years: 21.50    Types: Cigarettes  . Smokeless tobacco: Never Used  . Tobacco comment: quit early 02/2016  Substance and Sexual Activity  . Alcohol use: No    Alcohol/week: 0.0 oz  . Drug use: No  . Sexual activity: Not Currently

## 2017-10-17 NOTE — Telephone Encounter (Signed)
Osc ok 190min - y cpm bursitis - slap tear - biceps tendonitis - spinoglenoid notch cyst

## 2017-10-17 NOTE — Telephone Encounter (Signed)
Can you give Sherrie blue sheet for shoulder debridement 29822   arthroscopic dce [id number]  and sad [id number] and biceps tenodesis [id number] thx and call patient thx

## 2017-10-17 NOTE — Telephone Encounter (Signed)
You want it scheduled at [facility]? How long do you want it posted for? Do you want CPM? What diagnosis do you want me to put with it?

## 2017-10-26 ENCOUNTER — Encounter: Payer: Self-pay | Admitting: Family Medicine

## 2017-10-26 ENCOUNTER — Other Ambulatory Visit: Payer: Self-pay

## 2017-10-26 ENCOUNTER — Ambulatory Visit: Payer: Medicare Other | Admitting: Family Medicine

## 2017-10-26 VITALS — BP 122/84 | HR 92 | Temp 98.2°F | Ht 65.0 in | Wt 169.6 lb

## 2017-10-26 DIAGNOSIS — M545 Low back pain, unspecified: Secondary | ICD-10-CM

## 2017-10-26 DIAGNOSIS — Z23 Encounter for immunization: Secondary | ICD-10-CM | POA: Diagnosis not present

## 2017-10-26 DIAGNOSIS — N390 Urinary tract infection, site not specified: Secondary | ICD-10-CM

## 2017-10-26 DIAGNOSIS — R319 Hematuria, unspecified: Secondary | ICD-10-CM | POA: Diagnosis not present

## 2017-10-26 DIAGNOSIS — N200 Calculus of kidney: Secondary | ICD-10-CM

## 2017-10-26 LAB — POCT URINALYSIS DIP (MANUAL ENTRY)
BILIRUBIN UA: NEGATIVE mg/dL
Bilirubin, UA: NEGATIVE
Glucose, UA: NEGATIVE mg/dL
Nitrite, UA: NEGATIVE
PROTEIN UA: NEGATIVE mg/dL
SPEC GRAV UA: 1.025 (ref 1.010–1.025)
UROBILINOGEN UA: 0.2 U/dL
pH, UA: 6 (ref 5.0–8.0)

## 2017-10-26 LAB — POCT UA - MICROSCOPIC ONLY

## 2017-10-26 MED ORDER — CIPROFLOXACIN HCL 500 MG PO TABS: 500.0000 mg | ORAL_TABLET | Freq: Two times a day (BID) | ORAL | 0 refills | Status: AC

## 2017-10-26 NOTE — Patient Instructions (Signed)
You may have a urine infection, it may be a kidney stone, it might be both, it might be neither.   I am going to give you antibiotics. I will get one more piece of information - the culture results.  Likely you will not here from [place] until next Weds.   If you get worse - or if your symptoms don't go away, I will need to do a CT scan to look for stones and/or diverticulitis.  I am trying to avoid the CT scan if possible. Hope the antibiotics work.  Also push fluids. Merry Christmas. Strain your urine to see if we get lucky and catch.  If yes, bring it in for [place] to analyze.

## 2017-10-27 ENCOUNTER — Encounter: Payer: Self-pay | Admitting: Family Medicine

## 2017-10-27 DIAGNOSIS — N200 Calculus of kidney: Secondary | ICD-10-CM | POA: Insufficient documentation

## 2017-10-27 NOTE — Assessment & Plan Note (Addendum)
Reviewed x-rays.  Recently noted to have a 3 mm right kidney stone.  Ureteral stone is in differential.  Will try to avoid CT scan - she has had MANY CTs and we are both worried about cumulative radiation exposure.  Push fluids and strain urine.

## 2017-10-27 NOTE — Progress Notes (Signed)
   Subjective:    Patient ID: Danielle [name], female    DOB: 1955/11/10, 61 y.o.   MRN: [id number]  HPI  Patient has had right flank/CVA tenderness on and off for one month.  Specifically wonders about a kidney stone.  Had major troubles with kidney stones a decade ago but none recently.  She has never had a stone analyzed.  No trauma.  Has urgency and frequency, no dysuria of fever. Also has considerable osteoarthritis of spine.    Review of Systems     Objective:   Physical Exam Rt CVA tenderness, mild. Abd benign.   Lumbar spine, loss of normal lordosis.       Assessment & Plan:

## 2017-10-27 NOTE — Assessment & Plan Note (Signed)
Musculoskeletal pain is also in the differential.

## 2017-10-27 NOTE — Assessment & Plan Note (Signed)
Equivacal urine.  Will culture and start empiric antibiotics for upper track disease.

## 2017-10-28 LAB — URINE CULTURE

## 2017-11-09 ENCOUNTER — Ambulatory Visit (INDEPENDENT_AMBULATORY_CARE_PROVIDER_SITE_OTHER): Payer: Medicare Other | Admitting: Surgery

## 2017-11-09 ENCOUNTER — Encounter (INDEPENDENT_AMBULATORY_CARE_PROVIDER_SITE_OTHER): Payer: Self-pay | Admitting: Surgery

## 2017-11-09 ENCOUNTER — Ambulatory Visit (INDEPENDENT_AMBULATORY_CARE_PROVIDER_SITE_OTHER): Payer: Medicare Other

## 2017-11-09 DIAGNOSIS — M79672 Pain in left foot: Secondary | ICD-10-CM | POA: Diagnosis not present

## 2017-11-09 DIAGNOSIS — M2580 Other specified joint disorders, unspecified joint: Secondary | ICD-10-CM

## 2017-11-09 DIAGNOSIS — M79675 Pain in left toe(s): Secondary | ICD-10-CM | POA: Diagnosis not present

## 2017-11-09 MED ORDER — METHYLPREDNISOLONE ACETATE 40 MG/ML IJ SUSP
20.0000 mg | INTRAMUSCULAR | Status: AC | PRN
  Administered 2017-11-09: 20 mg via INTRA_ARTICULAR

## 2017-11-09 MED ORDER — BUPIVACAINE HCL 0.5 % IJ SOLN
0.5000 mL | INTRAMUSCULAR | Status: AC | PRN
  Administered 2017-11-09: .5 mL via INTRA_ARTICULAR

## 2017-11-09 NOTE — Progress Notes (Signed)
Office Visit Note   Patient: Danielle [name]           Date of Birth: September 28, 1956           MRN: [id number] Visit Date: 11/09/2017              Requested by: [name], MD [address] [place], [place] [address] PCP: [name], MD   Assessment & Plan: Visit Diagnoses:  1. Pain in left foot   2. Sesamoiditis   3. Great toe pain, left     Plan: In hopes of trying to sort out patient's pain and to give some relief offered left great toe first MTP injection. After patient consent dorsal toe was prepped with Betadine and intra-articular Marcaine/Depo-Medrol 1/2 to 1/2 injection was performed. Tolerated well without, condition. Patient did have some relief of MTP joint pain but continued to have pain around the sesamoids.  I did ask Dr. Sharol Given to see patient as well. He recommended doing Achilles stretching exercises due to tight heel cords. I gave patient a postop shoe. Wear this over the next week. She will see me in 1 week for recheck. If she continues to have pain I will plan to do Marcaine/Depo-Medrol sesamoid injection at that time. Blood work also drawn to check CBC and arthritis panel. All questions answered.  Follow-Up Instructions: Return in about 1 week (around 11/16/2017) for [facility] recheck left foot.   Orders:  Orders Placed This Encounter  Procedures  . XR Foot 2 Views Left  . CBC  . Antinuclear Antib (ANA)  . Sed Rate (ESR)  . Rheumatoid Factor  . Uric acid   No orders of the defined types were placed in this encounter.     Procedures: Small Joint Inj: L great MTP on 11/09/2017 9:52 AM Indications: pain Details: 25 G needle, dorsal approach Medications: 0.5 mL bupivacaine 0.5 %; 20 mg methylPREDNISolone acetate 40 MG/ML Consent was given by the patient. Patient was prepped and draped in the usual sterile fashion.       Clinical Data: No additional findings.   Subjective: No chief complaint on file.   HPI 62 year old white female comes  in to the office today with complaints of left foot pain. Pain localized to the first MTP joint and also around the plantar MTP joint. Pain when she is weightbearing. No completes of numbness tingling. She has been icing her foot off and on with minimal improvement.  No current cardiac pulmonary GI GU issues  Objective: Vital Signs: There were no vitals taken for this visit.  Physical Exam  Constitutional: No distress.  HENT:  Head: Normocephalic and atraumatic.  Eyes: EOM are normal. Pupils are equal, round, and reactive to light.  Pulmonary/Chest: No respiratory distress.  Musculoskeletal:  Patient is moderate to markedly tender over the first MTP joint. Also moderate to markedly tender over the sesamoids. Neurovascular intact.  Neurological: She is alert.  Skin: Skin is warm and dry.  Psychiatric: She has a normal mood and affect.    Ortho Exam  Specialty Comments:  No specialty comments available.  Imaging: No results found.   PMFS History: Patient Active Problem List   Diagnosis Date Noted  . Nephrolithiasis 10/27/2017  . Thrush 04/19/2017  . PTSD (post-traumatic stress disorder) 03/16/2017  . Vitamin D deficiency 02/09/2017    Class: Chronic  . Chest pain 02/23/2016  . Disorder of bone 01/12/2016  . Lower abdominal pain 02/20/2015  . Abdominal pain, epigastric 12/10/2014  .  Spinal stenosis in cervical region 07/01/2014  . Osteopenia 01/22/2014  . Pain in joint, ankle and foot 12/27/2013  . Diverticulitis large intestine w/o perforation or abscess w/o bleeding 07/25/2013  . TIA (transient ischemic attack) 04/20/2011  . DEPRESSION 03/12/2010  . Urinary tract infection 03/12/2010  . OBESITY 10/08/2009  . CYST AND PSEUDOCYST OF PANCREAS 09/21/2009  . HYPERCHOLESTEROLEMIA 12/10/2008  . ROTATOR CUFF SYNDROME 12/10/2008  . COLONIC POLYPS, HYPERPLASTIC 05/02/2008  . Essential hypertension 09/17/2007  . Palpitations 09/17/2007  . Diabetes mellitus type 2,  controlled, without complications ([place]) 51/70/0174  . Hypothyroidism 01/04/2007  . HYPERCALCEMIA 01/04/2007  . Tobacco abuse 01/04/2007  . CARPAL TUNNEL SYNDROME 01/04/2007  . GASTROESOPHAGEAL REFLUX, NO ESOPHAGITIS 01/04/2007  . CERVICAL SPINE DISORDER, NOS 01/04/2007  . Low back pain at multiple sites 01/04/2007   Past Medical History:  Diagnosis Date  . 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 Cervical spondylosis without myelopathy 09/12/2013  . Complication of anesthesia   . Diabetes mellitus without complication (HCC)    diet controlled -no meds  . Diverticulitis   . Dyslipidemia   . Family history of anesthesia complication    Mother - N/V  . Gastroesophageal reflux disease    8./25/15- no problem  . Glucose intolerance (pre-diabetes)   . Heart murmur    nothing to be concerned  . History of blood transfusion   . History of hiatal hernia   . History of tobacco abuse   . Hypertension   . Hypothyroidism   . Kidney stone   . Obesity   . Oral thrush    after back surgeries  . PONV (postoperative nausea and vomiting)   . Stroke [facility])    TIA X 2-3; last in 2013  . Tachycardia     Family History  Problem Relation Age of Onset  . Heart attack Father   . Cancer Father   . Diabetes Father   . Stroke Mother   . Diabetes Mother   . Cancer Mother   . Heart attack Brother   . Cancer Other   . Coronary artery disease Other   . Stroke Other   . Diabetes Other   . Hypertension Other   . Heart failure Other     Past Surgical History:  Procedure Laterality Date  . ABDOMINAL HYSTERECTOMY    . APPENDECTOMY    . BACK SURGERY     10 spine surgeries since 1988.  Marland Kitchen CARPAL TUNNEL RELEASE Right 04/07/2015   Procedure: RIGHT CARPAL TUNNEL RELEASE;  Surgeon: Daryll Brod, MD;  Location: [place];  Service: Orthopedics;  Laterality: Right;  . CARPAL TUNNEL RELEASE Left 09/29/2015   Procedure: CARPAL TUNNEL RELEASE LEFT ;  Surgeon: Daryll Brod, MD;  Location: [place];  Service: Orthopedics;  Laterality: Left;  . CHOLECYSTECTOMY    . CYSTOSCOPY W/ STONE MANIPULATION    . CYSTOSCOPY W/ URETERAL STENT PLACEMENT    . POSTERIOR CERVICAL FUSION/FORAMINOTOMY N/A 07/01/2014   Procedure: RIGHT C5-6 and C6-7 FORAMINOTOMY;  Surgeon: Jessy Oto, MD;  Location: [place];  Service: Orthopedics;  Laterality: N/A;  . TONSILLECTOMY    . ULNAR NERVE TRANSPOSITION Right 04/07/2015   Procedure: DECOMPRESSION  ULNAR NERVE RIGHT ELBOW;  Surgeon: Daryll Brod, MD;  Location: [place];  Service: Orthopedics;  Laterality: Right;  . ULNAR NERVE TRANSPOSITION Left 09/29/2015   Procedure: DECOMPRESSION  ULNAR NERVE LEFT ELBOW;  Surgeon: Daryll Brod, MD;  Location: [facility];  Service: Orthopedics;  Laterality: Left;   Social History   Occupational History  . Occupation: Retired Cabin crew  Tobacco Use  . Smoking status: Former Smoker    Packs/day: 0.50    Years: 43.00    Pack years: 21.50    Types: Cigarettes    Last attempt to quit: 09/26/2017    Years since quitting: 0.1  . Smokeless tobacco: Never Used  . Tobacco comment: quit early 02/2016  Substance and Sexual Activity  . Alcohol use: No    Alcohol/week: 0.0 oz  . Drug use: No  . Sexual activity: Not Currently

## 2017-11-10 LAB — CBC
HCT: 43.5 % (ref 35.0–45.0)
Hemoglobin: 14.5 g/dL (ref 11.7–15.5)
MCH: 28.9 pg (ref 27.0–33.0)
MCHC: 33.3 g/dL (ref 32.0–36.0)
MCV: 86.8 fL (ref 80.0–100.0)
MPV: 10.8 fL (ref 7.5–12.5)
PLATELETS: 323 10*3/uL (ref 140–400)
RBC: 5.01 10*6/uL (ref 3.80–5.10)
RDW: 11.8 % (ref 11.0–15.0)
WBC: 9.6 10*3/uL (ref 3.8–10.8)

## 2017-11-10 LAB — RHEUMATOID FACTOR

## 2017-11-10 LAB — SEDIMENTATION RATE: SED RATE: 9 mm/h (ref 0–30)

## 2017-11-10 LAB — ANA: ANA: NEGATIVE

## 2017-11-10 LAB — URIC ACID: URIC ACID, SERUM: 4.8 mg/dL (ref 2.5–7.0)

## 2017-11-16 ENCOUNTER — Ambulatory Visit (INDEPENDENT_AMBULATORY_CARE_PROVIDER_SITE_OTHER): Payer: Medicare Other | Admitting: Specialist

## 2017-11-16 ENCOUNTER — Encounter (INDEPENDENT_AMBULATORY_CARE_PROVIDER_SITE_OTHER): Payer: Self-pay | Admitting: Specialist

## 2017-11-16 VITALS — BP 147/94 | HR 92 | Ht 65.0 in | Wt 173.0 lb

## 2017-11-16 DIAGNOSIS — M19011 Primary osteoarthritis, right shoulder: Secondary | ICD-10-CM

## 2017-11-16 DIAGNOSIS — M5136 Other intervertebral disc degeneration, lumbar region: Secondary | ICD-10-CM | POA: Diagnosis not present

## 2017-11-16 DIAGNOSIS — M25551 Pain in right hip: Secondary | ICD-10-CM

## 2017-11-16 DIAGNOSIS — M51369 Other intervertebral disc degeneration, lumbar region without mention of lumbar back pain or lower extremity pain: Secondary | ICD-10-CM

## 2017-11-16 DIAGNOSIS — M7061 Trochanteric bursitis, right hip: Secondary | ICD-10-CM | POA: Diagnosis not present

## 2017-11-16 NOTE — Patient Instructions (Addendum)
Avoid frequent bending and stooping  No lifting greater than 10 lbs. May use ice or moist heat for pain. Weight loss is of benefit. Handicap license is approved.  Avoid overhead lifting and overhead use of the arms. Pillows to keep from sleeping directly on the shoulders Limited lifting to less than 10 lbs. Ice or heat for relief. NSAIDs are helpful, such as alleve or motrin, be careful not to use in excess as they place burdens on the kidney. Stretching exercise help and strengthening is helpful to build endurance. I have sent a message to Dr. Marlou Sa to consider intervention for the right shoulder pathology. An injection of the right hip intraarticular by Dr. Ernestina Patches is ordered.

## 2017-11-16 NOTE — Progress Notes (Addendum)
Office Visit Note   Patient: Danielle [name]           Date of Birth: 10-12-1956           MRN: [id number] Visit Date: 11/16/2017              Requested by: [name], MD [address] [place], [place] [address] PCP: [name], MD   Assessment & Plan: Visit Diagnoses:  1. Osteoarthritis of right AC (acromioclavicular) joint   2. Greater trochanteric bursitis of right hip   3. Degenerative disc disease, lumbar     Plan:  Avoid frequent bending and stooping  No lifting greater than 10 lbs. May use ice or moist heat for pain. Weight loss is of benefit. Handicap license is approved.  Avoid overhead lifting and overhead use of the arms. Pillows to keep from sleeping directly on the shoulders Limited lifting to less than 10 lbs. Ice or heat for relief. NSAIDs are helpful, such as alleve or motrin, be careful not to use in excess as they place burdens on the kidney. Stretching exercise help and strengthening is helpful to build endurance I have sent a message to Dr. Marlou Sa to consider intervention for the right shoulder pathology. An injection of the right hip intraarticular by Dr. Ernestina Patches is ordered.   Follow-Up Instructions: Return in about 4 weeks (around 12/14/2017).   Orders:  No orders of the defined types were placed in this encounter.  No orders of the defined types were placed in this encounter.     Procedures: No procedures performed   Clinical Data: No additional findings.   Subjective: Chief Complaint  Patient presents with  . Lower Back - Follow-up  . Right Hand - Follow-up  . Right Shoulder - Follow-up  . Right Hip - Follow-up    Right hip pain is constant even with ibuprofen, her hand is better. Underwent blood testing to check for gout.The right shoulder is painful with lying on the right side and with over head use of the arm she is experiencing severe pain. She has been to Dr. Marlou Sa and he has recommended arthroscopic surgery when  ever the pain reaches the point where she is not able to tolerate it, she reports she is at that point. I will send a note to Dr. Marlou Sa. She is also experiencing pain into the left buttock and near the tail bone. Intra articular injection 12/2016 gave significant pain rellief for 2 months and then the right hip pain returned, injection was then given of the right greater trochanter without relief.    Review of Systems  Constitutional: Negative.   HENT: Negative.   Eyes: Negative.   Respiratory: Negative.   Cardiovascular: Negative.   Gastrointestinal: Negative.   Endocrine: Negative.   Genitourinary: Negative.   Musculoskeletal: Negative.   Skin: Negative.   Allergic/Immunologic: Negative.   Neurological: Negative.   Hematological: Negative.   Psychiatric/Behavioral: Negative.      Objective: Vital Signs: BP (!) 147/94 (BP Location: Left Arm, Patient Position: Sitting)   Pulse 92   Ht 5\' 5"  (1.651 m)   Wt 173 lb (78.5 kg)   BMI 28.79 kg/m   Physical Exam  Constitutional: She is oriented to person, place, and time. She appears well-developed and well-nourished.  HENT:  Head: Normocephalic and atraumatic.  Eyes: EOM are normal. Pupils are equal, round, and reactive to light.  Neck: Normal range of motion. Neck supple.  Pulmonary/Chest: Effort normal and breath sounds  normal.  Abdominal: Soft. Bowel sounds are normal.  Neurological: She is alert and oriented to person, place, and time.  Skin: Skin is warm and dry.  Psychiatric: She has a normal mood and affect. Her behavior is normal. Judgment and thought content normal.    Back Exam   Tenderness  The patient is experiencing tenderness in the lumbar.  Range of Motion  Extension: abnormal  Flexion: abnormal  Lateral bend right: abnormal  Lateral bend left: abnormal  Rotation right: abnormal  Rotation left: abnormal   Muscle Strength  Right Quadriceps:  5/5  Left Quadriceps:  5/5  Right Hamstrings:  5/5  Left  Hamstrings:  5/5   Tests  Straight leg raise right: negative Straight leg raise left: negative  Reflexes  Patellar: normal Achilles: normal Biceps: normal Babinski's sign: normal   Other  Toe walk: normal Heel walk: abnormal Sensation: normal Gait: abnormal  Erythema: no back redness Scars: present  Comments:  Tender right greater trochanter and right proximal lateral thigh. Good ROM of the hips   Right Shoulder Exam   Tenderness  The patient is experiencing tenderness in the acromion and acromioclavicular joint.  Range of Motion  Active abduction: abnormal  Passive abduction: abnormal  Extension: abnormal  External rotation: abnormal  Forward flexion: normal  Internal rotation 0 degrees: T11  Internal rotation 90 degrees:  70 normal       Specialty Comments:  No specialty comments available.  Imaging: No results found.   PMFS History: Patient Active Problem List   Diagnosis Date Noted  . Vitamin D deficiency 02/09/2017    Priority: High    Class: Chronic  . Nephrolithiasis 10/27/2017  . Thrush 04/19/2017  . PTSD (post-traumatic stress disorder) 03/16/2017  . Chest pain 02/23/2016  . Disorder of bone 01/12/2016  . Lower abdominal pain 02/20/2015  . Abdominal pain, epigastric 12/10/2014  . Spinal stenosis in cervical region 07/01/2014  . Osteopenia 01/22/2014  . Pain in joint, ankle and foot 12/27/2013  . Diverticulitis large intestine w/o perforation or abscess w/o bleeding 07/25/2013  . TIA (transient ischemic attack) 04/20/2011  . DEPRESSION 03/12/2010  . Urinary tract infection 03/12/2010  . OBESITY 10/08/2009  . CYST AND PSEUDOCYST OF PANCREAS 09/21/2009  . HYPERCHOLESTEROLEMIA 12/10/2008  . ROTATOR CUFF SYNDROME 12/10/2008  . COLONIC POLYPS, HYPERPLASTIC 05/02/2008  . Essential hypertension 09/17/2007  . Palpitations 09/17/2007  . Diabetes mellitus type 2, controlled, without complications ([place]) 42/70/6237  . Hypothyroidism 01/04/2007    . HYPERCALCEMIA 01/04/2007  . Tobacco abuse 01/04/2007  . CARPAL TUNNEL SYNDROME 01/04/2007  . GASTROESOPHAGEAL REFLUX, NO ESOPHAGITIS 01/04/2007  . CERVICAL SPINE DISORDER, NOS 01/04/2007  . Low back pain at multiple sites 01/04/2007   Past Medical History:  Diagnosis Date  . 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 Cervical spondylosis without myelopathy 09/12/2013  . Complication of anesthesia   . Diabetes mellitus without complication (HCC)    diet controlled -no meds  . Diverticulitis   . Dyslipidemia   . Family history of anesthesia complication    Mother - N/V  . Gastroesophageal reflux disease    8./25/15- no problem  . Glucose intolerance (pre-diabetes)   . Heart murmur    nothing to be concerned  . History of blood transfusion   . History of hiatal hernia   . History of tobacco abuse   . Hypertension   . Hypothyroidism   . Kidney stone   . Obesity   . Oral thrush  after back surgeries  . PONV (postoperative nausea and vomiting)   . Stroke [facility])    TIA X 2-3; last in 2013  . Tachycardia     Family History  Problem Relation Age of Onset  . Heart attack Father   . Cancer Father   . Diabetes Father   . Stroke Mother   . Diabetes Mother   . Cancer Mother   . Heart attack Brother   . Cancer Other   . Coronary artery disease Other   . Stroke Other   . Diabetes Other   . Hypertension Other   . Heart failure Other     Past Surgical History:  Procedure Laterality Date  . ABDOMINAL HYSTERECTOMY    . APPENDECTOMY    . BACK SURGERY     10 spine surgeries since 1988.  Marland Kitchen CARPAL TUNNEL RELEASE Right 04/07/2015   Procedure: RIGHT CARPAL TUNNEL RELEASE;  Surgeon: Daryll Brod, MD;  Location: [place];  Service: Orthopedics;  Laterality: Right;  . CARPAL TUNNEL RELEASE Left 09/29/2015   Procedure: CARPAL TUNNEL RELEASE LEFT ;  Surgeon: Daryll Brod, MD;  Location: [place];  Service: Orthopedics;  Laterality: Left;  . CHOLECYSTECTOMY     . CYSTOSCOPY W/ STONE MANIPULATION    . CYSTOSCOPY W/ URETERAL STENT PLACEMENT    . POSTERIOR CERVICAL FUSION/FORAMINOTOMY N/A 07/01/2014   Procedure: RIGHT C5-6 and C6-7 FORAMINOTOMY;  Surgeon: Jessy Oto, MD;  Location: [place];  Service: Orthopedics;  Laterality: N/A;  . TONSILLECTOMY    . ULNAR NERVE TRANSPOSITION Right 04/07/2015   Procedure: DECOMPRESSION  ULNAR NERVE RIGHT ELBOW;  Surgeon: Daryll Brod, MD;  Location: [place];  Service: Orthopedics;  Laterality: Right;  . ULNAR NERVE TRANSPOSITION Left 09/29/2015   Procedure: DECOMPRESSION  ULNAR NERVE LEFT ELBOW;  Surgeon: Daryll Brod, MD;  Location: [place];  Service: Orthopedics;  Laterality: Left;   Social History   Occupational History  . Occupation: Retired Cabin crew  Tobacco Use  . Smoking status: Former Smoker    Packs/day: 0.50    Years: 43.00    Pack years: 21.50    Types: Cigarettes    Last attempt to quit: 09/26/2017    Years since quitting: 0.1  . Smokeless tobacco: Never Used  . Tobacco comment: quit early 02/2016  Substance and Sexual Activity  . Alcohol use: No    Alcohol/week: 0.0 oz  . Drug use: No  . Sexual activity: Not Currently

## 2017-11-16 NOTE — Progress Notes (Signed)
3.1.23

## 2017-11-20 ENCOUNTER — Encounter (INDEPENDENT_AMBULATORY_CARE_PROVIDER_SITE_OTHER): Payer: Self-pay | Admitting: Orthopedic Surgery

## 2017-11-20 DIAGNOSIS — M7521 Bicipital tendinitis, right shoulder: Secondary | ICD-10-CM | POA: Diagnosis not present

## 2017-11-20 DIAGNOSIS — M19011 Primary osteoarthritis, right shoulder: Secondary | ICD-10-CM | POA: Diagnosis not present

## 2017-11-20 DIAGNOSIS — S43431A Superior glenoid labrum lesion of right shoulder, initial encounter: Secondary | ICD-10-CM | POA: Diagnosis not present

## 2017-11-20 DIAGNOSIS — M7551 Bursitis of right shoulder: Secondary | ICD-10-CM | POA: Diagnosis not present

## 2017-11-24 ENCOUNTER — Ambulatory Visit (INDEPENDENT_AMBULATORY_CARE_PROVIDER_SITE_OTHER): Payer: Medicare Other | Admitting: Physical Medicine and Rehabilitation

## 2017-11-29 ENCOUNTER — Encounter (INDEPENDENT_AMBULATORY_CARE_PROVIDER_SITE_OTHER): Payer: Self-pay | Admitting: Orthopedic Surgery

## 2017-11-29 ENCOUNTER — Ambulatory Visit (INDEPENDENT_AMBULATORY_CARE_PROVIDER_SITE_OTHER): Payer: Medicare Other | Admitting: Orthopedic Surgery

## 2017-11-29 DIAGNOSIS — M19011 Primary osteoarthritis, right shoulder: Secondary | ICD-10-CM

## 2017-12-02 ENCOUNTER — Encounter (INDEPENDENT_AMBULATORY_CARE_PROVIDER_SITE_OTHER): Payer: Self-pay | Admitting: Orthopedic Surgery

## 2017-12-02 NOTE — Progress Notes (Signed)
Post-Op Visit Note   Patient: Danielle [name]           Date of Birth: 02-Sep-1956           MRN: [id number] Visit Date: 11/29/2017 PCP: [name], MD   Assessment & Plan:  Chief Complaint:  Chief Complaint  Patient presents with  . Right Shoulder - Routine Post Op   Visit Diagnoses:  1. Arthritis of right acromioclavicular joint     Plan: Brownie is doing well following shoulder arthroscopy 11/20/2017.  She had AC joint resection and biceps tenodesis.  Not having any problems.  Taking ibuprofen for pain.  On exam she has good range of motion and strength.  No Popeye deformity.  Plan at this time is to continue him exercise program of shoulder range of motion exercises.  Follow-up with me as needed.  Follow-Up Instructions: Return if symptoms worsen or fail to improve.   Orders:  No orders of the defined types were placed in this encounter.  No orders of the defined types were placed in this encounter.   Imaging: No results found.  PMFS History: Patient Active Problem List   Diagnosis Date Noted  . Nephrolithiasis 10/27/2017  . Thrush 04/19/2017  . PTSD (post-traumatic stress disorder) 03/16/2017  . Vitamin D deficiency 02/09/2017    Class: Chronic  . Chest pain 02/23/2016  . Disorder of bone 01/12/2016  . Lower abdominal pain 02/20/2015  . Abdominal pain, epigastric 12/10/2014  . Spinal stenosis in cervical region 07/01/2014  . Osteopenia 01/22/2014  . Pain in joint, ankle and foot 12/27/2013  . Diverticulitis large intestine w/o perforation or abscess w/o bleeding 07/25/2013  . TIA (transient ischemic attack) 04/20/2011  . DEPRESSION 03/12/2010  . Urinary tract infection 03/12/2010  . OBESITY 10/08/2009  . CYST AND PSEUDOCYST OF PANCREAS 09/21/2009  . HYPERCHOLESTEROLEMIA 12/10/2008  . ROTATOR CUFF SYNDROME 12/10/2008  . COLONIC POLYPS, HYPERPLASTIC 05/02/2008  . Essential hypertension 09/17/2007  . Palpitations 09/17/2007  . Diabetes mellitus type 2,  controlled, without complications ([place]) 32/95/1884  . Hypothyroidism 01/04/2007  . HYPERCALCEMIA 01/04/2007  . Tobacco abuse 01/04/2007  . CARPAL TUNNEL SYNDROME 01/04/2007  . GASTROESOPHAGEAL REFLUX, NO ESOPHAGITIS 01/04/2007  . CERVICAL SPINE DISORDER, NOS 01/04/2007  . Low back pain at multiple sites 01/04/2007   Past Medical History:  Diagnosis Date  . 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 Cervical spondylosis without myelopathy 09/12/2013  . Complication of anesthesia   . Diabetes mellitus without complication (HCC)    diet controlled -no meds  . Diverticulitis   . Dyslipidemia   . Family history of anesthesia complication    Mother - N/V  . Gastroesophageal reflux disease    8./25/15- no problem  . Glucose intolerance (pre-diabetes)   . Heart murmur    nothing to be concerned  . History of blood transfusion   . History of hiatal hernia   . History of tobacco abuse   . Hypertension   . Hypothyroidism   . Kidney stone   . Obesity   . Oral thrush    after back surgeries  . PONV (postoperative nausea and vomiting)   . Stroke [facility])    TIA X 2-3; last in 2013  . Tachycardia     Family History  Problem Relation Age of Onset  . Heart attack Father   . Cancer Father   . Diabetes Father   . Stroke Mother   . Diabetes Mother   . Cancer  Mother   . Heart attack Brother   . Cancer Other   . Coronary artery disease Other   . Stroke Other   . Diabetes Other   . Hypertension Other   . Heart failure Other     Past Surgical History:  Procedure Laterality Date  . ABDOMINAL HYSTERECTOMY    . APPENDECTOMY    . BACK SURGERY     10 spine surgeries since 1988.  Marland Kitchen CARPAL TUNNEL RELEASE Right 04/07/2015   Procedure: RIGHT CARPAL TUNNEL RELEASE;  Surgeon: Daryll Brod, MD;  Location: [place];  Service: Orthopedics;  Laterality: Right;  . CARPAL TUNNEL RELEASE Left 09/29/2015   Procedure: CARPAL TUNNEL RELEASE LEFT ;  Surgeon: Daryll Brod, MD;  Location: [place];  Service: Orthopedics;  Laterality: Left;  . CHOLECYSTECTOMY    . CYSTOSCOPY W/ STONE MANIPULATION    . CYSTOSCOPY W/ URETERAL STENT PLACEMENT    . POSTERIOR CERVICAL FUSION/FORAMINOTOMY N/A 07/01/2014   Procedure: RIGHT C5-6 and C6-7 FORAMINOTOMY;  Surgeon: Jessy Oto, MD;  Location: [place];  Service: Orthopedics;  Laterality: N/A;  . TONSILLECTOMY    . ULNAR NERVE TRANSPOSITION Right 04/07/2015   Procedure: DECOMPRESSION  ULNAR NERVE RIGHT ELBOW;  Surgeon: Daryll Brod, MD;  Location: [place];  Service: Orthopedics;  Laterality: Right;  . ULNAR NERVE TRANSPOSITION Left 09/29/2015   Procedure: DECOMPRESSION  ULNAR NERVE LEFT ELBOW;  Surgeon: Daryll Brod, MD;  Location: [place];  Service: Orthopedics;  Laterality: Left;   Social History   Occupational History  . Occupation: Retired Cabin crew  Tobacco Use  . Smoking status: Former Smoker    Packs/day: 0.50    Years: 43.00    Pack years: 21.50    Types: Cigarettes    Last attempt to quit: 09/26/2017    Years since quitting: 0.1  . Smokeless tobacco: Never Used  . Tobacco comment: quit early 02/2016  Substance and Sexual Activity  . Alcohol use: No    Alcohol/week: 0.0 oz  . Drug use: No  . Sexual activity: Not Currently

## 2018-01-03 ENCOUNTER — Ambulatory Visit (INDEPENDENT_AMBULATORY_CARE_PROVIDER_SITE_OTHER): Payer: Medicare Other | Admitting: Specialist

## 2018-01-08 ENCOUNTER — Telehealth (INDEPENDENT_AMBULATORY_CARE_PROVIDER_SITE_OTHER): Payer: Self-pay | Admitting: Physical Medicine and Rehabilitation

## 2018-01-08 NOTE — Telephone Encounter (Signed)
Appointment cancelled

## 2018-01-08 NOTE — Telephone Encounter (Signed)
Patient called stating that her mother has taken a turn for the worse and not able to make this appointment.  Thank you.

## 2018-01-09 ENCOUNTER — Ambulatory Visit (INDEPENDENT_AMBULATORY_CARE_PROVIDER_SITE_OTHER): Payer: Medicare Other | Admitting: Specialist

## 2018-01-11 ENCOUNTER — Ambulatory Visit (INDEPENDENT_AMBULATORY_CARE_PROVIDER_SITE_OTHER): Payer: Self-pay | Admitting: Physical Medicine and Rehabilitation

## 2018-01-12 ENCOUNTER — Other Ambulatory Visit: Payer: Self-pay | Admitting: Family Medicine

## 2018-01-12 DIAGNOSIS — I10 Essential (primary) hypertension: Secondary | ICD-10-CM

## 2018-01-12 DIAGNOSIS — R079 Chest pain, unspecified: Secondary | ICD-10-CM

## 2018-01-17 ENCOUNTER — Telehealth (INDEPENDENT_AMBULATORY_CARE_PROVIDER_SITE_OTHER): Payer: Self-pay | Admitting: Orthopedic Surgery

## 2018-01-17 NOTE — Telephone Encounter (Signed)
IC and advised her that she would need to complete medical release form.

## 2018-01-17 NOTE — Telephone Encounter (Signed)
Patient called asking for her office notes to be faxed over to [facility] in [place]. She's currently down there taking care of her ill mother and is having issues with her shoulder. Fax # [phone]  If she could get a call back at [phone]

## 2018-01-18 NOTE — Telephone Encounter (Signed)
Will fax records as requested once we receive her release form

## 2018-01-22 ENCOUNTER — Ambulatory Visit (INDEPENDENT_AMBULATORY_CARE_PROVIDER_SITE_OTHER): Payer: Medicare Other | Admitting: Orthopedic Surgery

## 2018-01-29 ENCOUNTER — Ambulatory Visit (INDEPENDENT_AMBULATORY_CARE_PROVIDER_SITE_OTHER): Payer: Medicare Other | Admitting: Orthopedic Surgery

## 2018-01-29 ENCOUNTER — Encounter (INDEPENDENT_AMBULATORY_CARE_PROVIDER_SITE_OTHER): Payer: Self-pay | Admitting: Orthopedic Surgery

## 2018-01-29 DIAGNOSIS — M19011 Primary osteoarthritis, right shoulder: Secondary | ICD-10-CM

## 2018-01-29 MED ORDER — METHOCARBAMOL 500 MG PO TABS: 500.0000 mg | ORAL_TABLET | Freq: Three times a day (TID) | ORAL | 0 refills | Status: AC | PRN

## 2018-01-29 NOTE — Progress Notes (Signed)
Post-Op Visit Note   Patient: Danielle [name]           Date of Birth: August 30, 1956           MRN: [id number] Visit Date: 01/29/2018 PCP: [name], MD   Assessment & Plan:  Chief Complaint:  Chief Complaint  Patient presents with  . Right Shoulder - Pain   Visit Diagnoses:  1. Arthritis of right acromioclavicular joint     Plan: [name] is a patient who is now 8 weeks out from arthroscopy and distal clavicle excision.  She is been having new onset of popping in the shoulder.  She still doing her exercises.  On examination the patient has excellent range of motion and a little bit of grinding with passive range of motion.  This is more in the anterior acromial region.  Plan is to refill her Robaxin.  Also would like to do 9 and 1 injection into the right subacromial space.  She is taking care of her mother who is very ill in [place].  If she has further symptoms she will call me and we can arrange for MRI scan to rule out cuff tear.  Would not do that for at least 4-6 weeks to see if this injection helps.  Follow-Up Instructions: Return if symptoms worsen or fail to improve.   Orders:  No orders of the defined types were placed in this encounter.  Meds ordered this encounter  Medications  . methocarbamol (ROBAXIN) 500 MG tablet    Sig: Take 1 tablet (500 mg total) by mouth every 8 (eight) hours as needed for muscle spasms.    Dispense:  30 tablet    Refill:  0    Imaging: No results found.  PMFS History: Patient Active Problem List   Diagnosis Date Noted  . Nephrolithiasis 10/27/2017  . Thrush 04/19/2017  . PTSD (post-traumatic stress disorder) 03/16/2017  . Vitamin D deficiency 02/09/2017    Class: Chronic  . Chest pain 02/23/2016  . Disorder of bone 01/12/2016  . Lower abdominal pain 02/20/2015  . Abdominal pain, epigastric 12/10/2014  . Spinal stenosis in cervical region 07/01/2014  . Osteopenia 01/22/2014  . Pain in joint, ankle and foot 12/27/2013  .  Diverticulitis large intestine w/o perforation or abscess w/o bleeding 07/25/2013  . TIA (transient ischemic attack) 04/20/2011  . DEPRESSION 03/12/2010  . Urinary tract infection 03/12/2010  . OBESITY 10/08/2009  . CYST AND PSEUDOCYST OF PANCREAS 09/21/2009  . HYPERCHOLESTEROLEMIA 12/10/2008  . ROTATOR CUFF SYNDROME 12/10/2008  . COLONIC POLYPS, HYPERPLASTIC 05/02/2008  . Essential hypertension 09/17/2007  . Palpitations 09/17/2007  . Diabetes mellitus type 2, controlled, without complications ([place]) 12/87/8676  . Hypothyroidism 01/04/2007  . HYPERCALCEMIA 01/04/2007  . Tobacco abuse 01/04/2007  . CARPAL TUNNEL SYNDROME 01/04/2007  . GASTROESOPHAGEAL REFLUX, NO ESOPHAGITIS 01/04/2007  . CERVICAL SPINE DISORDER, NOS 01/04/2007  . Low back pain at multiple sites 01/04/2007   Past Medical History:  Diagnosis Date  . 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 Cervical spondylosis without myelopathy 09/12/2013  . Complication of anesthesia   . Diabetes mellitus without complication (HCC)    diet controlled -no meds  . Diverticulitis   . Dyslipidemia   . Family history of anesthesia complication    Mother - N/V  . Gastroesophageal reflux disease    8./25/15- no problem  . Glucose intolerance (pre-diabetes)   . Heart murmur    nothing to be concerned  . History of blood  transfusion   . History of hiatal hernia   . History of tobacco abuse   . Hypertension   . Hypothyroidism   . Kidney stone   . Obesity   . Oral thrush    after back surgeries  . PONV (postoperative nausea and vomiting)   . Stroke [facility])    TIA X 2-3; last in 2013  . Tachycardia     Family History  Problem Relation Age of Onset  . Heart attack Father   . Cancer Father   . Diabetes Father   . Stroke Mother   . Diabetes Mother   . Cancer Mother   . Heart attack Brother   . Cancer Other   . Coronary artery disease Other   . Stroke Other   . Diabetes Other   . Hypertension Other   . Heart failure Other       Past Surgical History:  Procedure Laterality Date  . ABDOMINAL HYSTERECTOMY    . APPENDECTOMY    . BACK SURGERY     10 spine surgeries since 1988.  Marland Kitchen CARPAL TUNNEL RELEASE Right 04/07/2015   Procedure: RIGHT CARPAL TUNNEL RELEASE;  Surgeon: Daryll Brod, MD;  Location: [place];  Service: Orthopedics;  Laterality: Right;  . CARPAL TUNNEL RELEASE Left 09/29/2015   Procedure: CARPAL TUNNEL RELEASE LEFT ;  Surgeon: Daryll Brod, MD;  Location: [place];  Service: Orthopedics;  Laterality: Left;  . CHOLECYSTECTOMY    . CYSTOSCOPY W/ STONE MANIPULATION    . CYSTOSCOPY W/ URETERAL STENT PLACEMENT    . POSTERIOR CERVICAL FUSION/FORAMINOTOMY N/A 07/01/2014   Procedure: RIGHT C5-6 and C6-7 FORAMINOTOMY;  Surgeon: Jessy Oto, MD;  Location: [place];  Service: Orthopedics;  Laterality: N/A;  . TONSILLECTOMY    . ULNAR NERVE TRANSPOSITION Right 04/07/2015   Procedure: DECOMPRESSION  ULNAR NERVE RIGHT ELBOW;  Surgeon: Daryll Brod, MD;  Location: [place];  Service: Orthopedics;  Laterality: Right;  . ULNAR NERVE TRANSPOSITION Left 09/29/2015   Procedure: DECOMPRESSION  ULNAR NERVE LEFT ELBOW;  Surgeon: Daryll Brod, MD;  Location: [place];  Service: Orthopedics;  Laterality: Left;   Social History   Occupational History  . Occupation: Retired Cabin crew  Tobacco Use  . Smoking status: Former Smoker    Packs/day: 0.50    Years: 43.00    Pack years: 21.50    Types: Cigarettes    Last attempt to quit: 09/26/2017    Years since quitting: 0.3  . Smokeless tobacco: Never Used  . Tobacco comment: quit early 02/2016  Substance and Sexual Activity  . Alcohol use: No    Alcohol/week: 0.0 oz  . Drug use: No  . Sexual activity: Not Currently

## 2018-01-31 ENCOUNTER — Telehealth (INDEPENDENT_AMBULATORY_CARE_PROVIDER_SITE_OTHER): Payer: Self-pay

## 2018-01-31 NOTE — Telephone Encounter (Signed)
Danielle [name] from [facility] stating that [place] is needing some clinical information to authorize the refill of the robaxin, which she stated Methocarbamol, which is the same thing that they denied.  Her CB#[phone] option 5.  Thank you.

## 2018-01-31 NOTE — Telephone Encounter (Signed)
Received notification from [facility] that they will not authorize robaxin. Do you want to change to something different?

## 2018-01-31 NOTE — Telephone Encounter (Signed)
Patient is having muscle spasm following shoulder surgery.  1 refill Robaxin okay.

## 2018-02-01 ENCOUNTER — Telehealth (INDEPENDENT_AMBULATORY_CARE_PROVIDER_SITE_OTHER): Payer: Self-pay | Admitting: Orthopedic Surgery

## 2018-02-01 NOTE — Telephone Encounter (Signed)
IC s/w Kegris, and provided diagnosis code again. She requested office notes faxed again. I faxed.

## 2018-02-01 NOTE — Telephone Encounter (Signed)
IC and tried to get [place]. Denied again. Needs appeal. They will fax me appeal form to complete and fax back.

## 2018-02-01 NOTE — Telephone Encounter (Signed)
Kegris with [employer] Dept.would like a call back concerning a denial for Methocarbamol.  Has some questions pertaining to the medication. Cb# is [phone].  Please advise.  Thank You.

## 2018-02-01 NOTE — Telephone Encounter (Signed)
Faxed expedited appeal to [phone]

## 2018-02-01 NOTE — Telephone Encounter (Signed)
Danielle [name] with [employer] called to let you know they received the standard RX appeal, it has a 7 day or less turn around time. She said any additional info can be faxed to [phone] with their member ID # and name on all attachments.

## 2018-02-01 NOTE — Telephone Encounter (Signed)
Faxed clinical info.

## 2018-02-01 NOTE — Telephone Encounter (Signed)
Per Danielle [name], they will contact patient to let her know if medication approved or denied.

## 2018-02-09 ENCOUNTER — Telehealth: Payer: Self-pay | Admitting: *Deleted

## 2018-02-09 ENCOUNTER — Other Ambulatory Visit: Payer: Self-pay | Admitting: *Deleted

## 2018-02-09 DIAGNOSIS — E039 Hypothyroidism, unspecified: Secondary | ICD-10-CM

## 2018-02-09 NOTE — Telephone Encounter (Signed)
Patient called back and verified that the med is to go to [facility]. Danley Danker, RN [facility] RN)

## 2018-02-09 NOTE — Telephone Encounter (Signed)
LVM for pt to call back to verify her pharmacy.  Received a request from [facility] for her levothyroxine and [employer] was not on her preferred pharmacies.  Please verify which pharmacy she uses and route back to [facility] so we can update this and send to [facility] if need be.Tacy Dura Rumple, April D, CMA

## 2018-02-12 MED ORDER — LEVOTHYROXINE SODIUM 200 MCG PO TABS: ORAL_TABLET | ORAL | 3 refills | Status: DC

## 2018-02-16 ENCOUNTER — Other Ambulatory Visit (INDEPENDENT_AMBULATORY_CARE_PROVIDER_SITE_OTHER): Payer: Self-pay | Admitting: Specialist

## 2018-02-16 DIAGNOSIS — M47812 Spondylosis without myelopathy or radiculopathy, cervical region: Secondary | ICD-10-CM

## 2018-02-16 DIAGNOSIS — M75111 Incomplete rotator cuff tear or rupture of right shoulder, not specified as traumatic: Secondary | ICD-10-CM

## 2018-02-16 NOTE — Telephone Encounter (Signed)
Claris Che with [facility] called needing status of Rx (Ibuprofen) that was faxed 02/09/18. The number to contact Claris Che is [phone]

## 2018-02-19 MED ORDER — IBUPROFEN 800 MG PO TABS: 800.0000 mg | ORAL_TABLET | Freq: Three times a day (TID) | ORAL | 6 refills | Status: DC | PRN

## 2018-02-19 NOTE — Telephone Encounter (Signed)
Sent request to Dr. Nitka 

## 2018-02-20 MED ORDER — IBUPROFEN 800 MG PO TABS: 800.0000 mg | ORAL_TABLET | Freq: Three times a day (TID) | ORAL | 6 refills | Status: DC | PRN

## 2018-02-20 NOTE — Telephone Encounter (Signed)
I changed the delivery method to fax and resent it to [facility]

## 2018-02-20 NOTE — Addendum Note (Signed)
Addended by: Minda Ditto, Geoffery Spruce on: 02/20/2018 09:27 AM   Modules accepted: Orders

## 2018-03-28 ENCOUNTER — Other Ambulatory Visit: Payer: Self-pay

## 2018-03-28 DIAGNOSIS — R079 Chest pain, unspecified: Secondary | ICD-10-CM

## 2018-03-29 MED ORDER — ATORVASTATIN CALCIUM 80 MG PO TABS: 80.0000 mg | ORAL_TABLET | Freq: Every day | ORAL | 3 refills | Status: DC

## 2018-03-29 MED ORDER — CLOPIDOGREL BISULFATE 75 MG PO TABS: 75.0000 mg | ORAL_TABLET | Freq: Every day | ORAL | 3 refills | Status: DC

## 2018-04-03 ENCOUNTER — Other Ambulatory Visit: Payer: Self-pay

## 2018-04-03 DIAGNOSIS — R079 Chest pain, unspecified: Secondary | ICD-10-CM

## 2018-04-03 MED ORDER — ATORVASTATIN CALCIUM 80 MG PO TABS: 80.0000 mg | ORAL_TABLET | Freq: Every day | ORAL | 3 refills | Status: AC

## 2018-04-03 MED ORDER — CLOPIDOGREL BISULFATE 75 MG PO TABS: 75.0000 mg | ORAL_TABLET | Freq: Every day | ORAL | 3 refills | Status: AC

## 2018-06-05 ENCOUNTER — Telehealth: Payer: Self-pay | Admitting: Family Medicine

## 2018-06-05 NOTE — Telephone Encounter (Signed)
LVM - Diabetes management/check up. Recheck A1c. CR

## 2018-10-16 ENCOUNTER — Telehealth: Payer: Self-pay | Admitting: Family Medicine

## 2018-10-16 NOTE — Telephone Encounter (Signed)
Oh my.  A tough year for the [place].  I appreciate the update and look forward to seeing [facility] when convenient.

## 2018-10-16 NOTE — Telephone Encounter (Signed)
I spoke with the pt briefly about trying to schedule a health maintenance appt because they haven't been seen by their PCP in a while. Pt states that she is hoping to come back into town in the coming weeks/months when she has the time to do so, as she is unfortunately currently in [place] taking care of her mother who has cancer. She states she has also had 2 strokes since going to be her mother's caretaker and has seen a provider in the area after being released from the hospital, but she hopes she can get some help soon to be able to set an appt with Dr. Andria Frames. -[place]

## 2018-10-16 NOTE — Telephone Encounter (Signed)
Routing to Dr. Andria Frames -- Juluis Rainier.

## 2019-02-07 ENCOUNTER — Other Ambulatory Visit: Payer: Self-pay

## 2019-02-07 DIAGNOSIS — E039 Hypothyroidism, unspecified: Secondary | ICD-10-CM

## 2019-02-07 MED ORDER — LEVOTHYROXINE SODIUM 200 MCG PO TABS: ORAL_TABLET | ORAL | 3 refills | Status: AC

## 2019-07-10 ENCOUNTER — Other Ambulatory Visit (INDEPENDENT_AMBULATORY_CARE_PROVIDER_SITE_OTHER): Payer: Self-pay | Admitting: Specialist

## 2019-07-10 DIAGNOSIS — M75111 Incomplete rotator cuff tear or rupture of right shoulder, not specified as traumatic: Secondary | ICD-10-CM

## 2019-07-10 DIAGNOSIS — M47812 Spondylosis without myelopathy or radiculopathy, cervical region: Secondary | ICD-10-CM

## 2019-12-16 ENCOUNTER — Other Ambulatory Visit (INDEPENDENT_AMBULATORY_CARE_PROVIDER_SITE_OTHER): Payer: Self-pay | Admitting: Specialist

## 2019-12-16 DIAGNOSIS — M75111 Incomplete rotator cuff tear or rupture of right shoulder, not specified as traumatic: Secondary | ICD-10-CM

## 2019-12-16 DIAGNOSIS — M47812 Spondylosis without myelopathy or radiculopathy, cervical region: Secondary | ICD-10-CM

## 2020-05-25 ENCOUNTER — Other Ambulatory Visit (INDEPENDENT_AMBULATORY_CARE_PROVIDER_SITE_OTHER): Payer: Self-pay | Admitting: Specialist

## 2020-05-25 DIAGNOSIS — M47812 Spondylosis without myelopathy or radiculopathy, cervical region: Secondary | ICD-10-CM

## 2020-05-25 DIAGNOSIS — M75111 Incomplete rotator cuff tear or rupture of right shoulder, not specified as traumatic: Secondary | ICD-10-CM

## 2020-05-25 NOTE — Telephone Encounter (Signed)
Needs return office visit with Dr. Louanne Skye.  Last seen by him January 2019.  Also needs blood work at that visit to check CBC and c-Met due to long-term use of oral NSAIDs

## 2020-05-25 NOTE — Telephone Encounter (Signed)
Pls advise. Thanks.
